# Patient Record
Sex: Male | Born: 1955 | Race: White | Hispanic: No | Marital: Married | Smoking: Never smoker
Health system: Southern US, Community
[De-identification: ages and names within clinical notes are randomized; demographics above are authoritative.]

## PROBLEM LIST (undated history)

## (undated) DIAGNOSIS — F419 Anxiety disorder, unspecified: Secondary | ICD-10-CM

## (undated) DIAGNOSIS — K859 Acute pancreatitis without necrosis or infection, unspecified: Secondary | ICD-10-CM

## (undated) DIAGNOSIS — J45909 Unspecified asthma, uncomplicated: Secondary | ICD-10-CM

## (undated) DIAGNOSIS — K8020 Calculus of gallbladder without cholecystitis without obstruction: Secondary | ICD-10-CM

## (undated) HISTORY — PX: GUM SURGERY: SHX658

## (undated) HISTORY — DX: Calculus of gallbladder without cholecystitis without obstruction: K80.20

---

## 1999-10-17 ENCOUNTER — Emergency Department (HOSPITAL_COMMUNITY): Admission: EM | Admit: 1999-10-17 | Discharge: 1999-10-17 | Payer: Self-pay | Admitting: Emergency Medicine

## 2000-11-01 ENCOUNTER — Encounter: Admission: RE | Admit: 2000-11-01 | Discharge: 2000-11-01 | Payer: Self-pay | Admitting: Internal Medicine

## 2000-11-02 ENCOUNTER — Emergency Department (HOSPITAL_COMMUNITY): Admission: EM | Admit: 2000-11-02 | Discharge: 2000-11-02 | Payer: Self-pay | Admitting: Internal Medicine

## 2004-04-11 ENCOUNTER — Emergency Department (HOSPITAL_COMMUNITY): Admission: EM | Admit: 2004-04-11 | Discharge: 2004-04-12 | Payer: Self-pay | Admitting: Emergency Medicine

## 2016-12-11 ENCOUNTER — Inpatient Hospital Stay (HOSPITAL_COMMUNITY): Payer: BC Managed Care – PPO

## 2016-12-11 ENCOUNTER — Inpatient Hospital Stay (HOSPITAL_COMMUNITY)
Admission: EM | Admit: 2016-12-11 | Discharge: 2016-12-23 | DRG: 417 | Disposition: A | Discharge status: Home / Self Care | Payer: BC Managed Care – PPO | Attending: Internal Medicine | Admitting: Internal Medicine

## 2016-12-11 ENCOUNTER — Emergency Department (HOSPITAL_COMMUNITY): Payer: BC Managed Care – PPO

## 2016-12-11 ENCOUNTER — Encounter (HOSPITAL_COMMUNITY): Payer: Self-pay | Admitting: Emergency Medicine

## 2016-12-11 DIAGNOSIS — R188 Other ascites: Secondary | ICD-10-CM | POA: Diagnosis not present

## 2016-12-11 DIAGNOSIS — Z7952 Long term (current) use of systemic steroids: Secondary | ICD-10-CM | POA: Diagnosis not present

## 2016-12-11 DIAGNOSIS — Y838 Other surgical procedures as the cause of abnormal reaction of the patient, or of later complication, without mention of misadventure at the time of the procedure: Secondary | ICD-10-CM | POA: Diagnosis not present

## 2016-12-11 DIAGNOSIS — J3801 Paralysis of vocal cords and larynx, unilateral: Secondary | ICD-10-CM | POA: Diagnosis not present

## 2016-12-11 DIAGNOSIS — R1313 Dysphagia, pharyngeal phase: Secondary | ICD-10-CM | POA: Diagnosis not present

## 2016-12-11 DIAGNOSIS — K859 Acute pancreatitis without necrosis or infection, unspecified: Secondary | ICD-10-CM

## 2016-12-11 DIAGNOSIS — J9811 Atelectasis: Secondary | ICD-10-CM | POA: Diagnosis not present

## 2016-12-11 DIAGNOSIS — M79606 Pain in leg, unspecified: Secondary | ICD-10-CM | POA: Diagnosis not present

## 2016-12-11 DIAGNOSIS — G8929 Other chronic pain: Secondary | ICD-10-CM | POA: Diagnosis present

## 2016-12-11 DIAGNOSIS — Z7951 Long term (current) use of inhaled steroids: Secondary | ICD-10-CM | POA: Diagnosis not present

## 2016-12-11 DIAGNOSIS — Y703 Surgical instruments, materials and anesthesiology devices (including sutures) associated with adverse incidents: Secondary | ICD-10-CM | POA: Diagnosis not present

## 2016-12-11 DIAGNOSIS — D72829 Elevated white blood cell count, unspecified: Secondary | ICD-10-CM | POA: Diagnosis not present

## 2016-12-11 DIAGNOSIS — R482 Apraxia: Secondary | ICD-10-CM | POA: Diagnosis not present

## 2016-12-11 DIAGNOSIS — Y92234 Operating room of hospital as the place of occurrence of the external cause: Secondary | ICD-10-CM | POA: Diagnosis not present

## 2016-12-11 DIAGNOSIS — Z79899 Other long term (current) drug therapy: Secondary | ICD-10-CM

## 2016-12-11 DIAGNOSIS — F419 Anxiety disorder, unspecified: Secondary | ICD-10-CM | POA: Diagnosis present

## 2016-12-11 DIAGNOSIS — E872 Acidosis: Secondary | ICD-10-CM | POA: Diagnosis present

## 2016-12-11 DIAGNOSIS — E876 Hypokalemia: Secondary | ICD-10-CM | POA: Diagnosis not present

## 2016-12-11 DIAGNOSIS — E861 Hypovolemia: Secondary | ICD-10-CM | POA: Diagnosis not present

## 2016-12-11 DIAGNOSIS — N179 Acute kidney failure, unspecified: Secondary | ICD-10-CM | POA: Diagnosis not present

## 2016-12-11 DIAGNOSIS — E871 Hypo-osmolality and hyponatremia: Secondary | ICD-10-CM | POA: Diagnosis not present

## 2016-12-11 DIAGNOSIS — D72825 Bandemia: Secondary | ICD-10-CM | POA: Diagnosis not present

## 2016-12-11 DIAGNOSIS — J90 Pleural effusion, not elsewhere classified: Secondary | ICD-10-CM | POA: Diagnosis not present

## 2016-12-11 DIAGNOSIS — G4700 Insomnia, unspecified: Secondary | ICD-10-CM | POA: Diagnosis present

## 2016-12-11 DIAGNOSIS — K851 Biliary acute pancreatitis without necrosis or infection: Secondary | ICD-10-CM

## 2016-12-11 DIAGNOSIS — B370 Candidal stomatitis: Secondary | ICD-10-CM | POA: Diagnosis not present

## 2016-12-11 DIAGNOSIS — E860 Dehydration: Secondary | ICD-10-CM | POA: Diagnosis present

## 2016-12-11 DIAGNOSIS — K8000 Calculus of gallbladder with acute cholecystitis without obstruction: Secondary | ICD-10-CM | POA: Diagnosis not present

## 2016-12-11 DIAGNOSIS — J45909 Unspecified asthma, uncomplicated: Secondary | ICD-10-CM | POA: Diagnosis present

## 2016-12-11 DIAGNOSIS — K8020 Calculus of gallbladder without cholecystitis without obstruction: Principal | ICD-10-CM | POA: Diagnosis present

## 2016-12-11 DIAGNOSIS — R609 Edema, unspecified: Secondary | ICD-10-CM | POA: Diagnosis not present

## 2016-12-11 DIAGNOSIS — D72823 Leukemoid reaction: Secondary | ICD-10-CM | POA: Diagnosis not present

## 2016-12-11 DIAGNOSIS — Z419 Encounter for procedure for purposes other than remedying health state, unspecified: Secondary | ICD-10-CM

## 2016-12-11 DIAGNOSIS — E8770 Fluid overload, unspecified: Secondary | ICD-10-CM | POA: Diagnosis not present

## 2016-12-11 DIAGNOSIS — I959 Hypotension, unspecified: Secondary | ICD-10-CM | POA: Diagnosis present

## 2016-12-11 HISTORY — DX: Anxiety disorder, unspecified: F41.9

## 2016-12-11 HISTORY — DX: Unspecified asthma, uncomplicated: J45.909

## 2016-12-11 LAB — COMPREHENSIVE METABOLIC PANEL
ALT: 24 U/L (ref 17–63)
AST: 28 U/L (ref 15–41)
Albumin: 3.7 g/dL (ref 3.5–5.0)
Alkaline Phosphatase: 29 U/L — ABNORMAL LOW (ref 38–126)
Anion gap: 10 (ref 5–15)
BUN: 35 mg/dL — ABNORMAL HIGH (ref 6–20)
CO2: 22 mmol/L (ref 22–32)
Calcium: 8.8 mg/dL — ABNORMAL LOW (ref 8.9–10.3)
Chloride: 100 mmol/L — ABNORMAL LOW (ref 101–111)
Creatinine, Ser: 1.39 mg/dL — ABNORMAL HIGH (ref 0.61–1.24)
GFR calc Af Amer: >60 mL/min (ref >60)
GFR calc non Af Amer: 53 mL/min — ABNORMAL LOW (ref >60)
Glucose, Bld: 133 mg/dL — ABNORMAL HIGH (ref 65–99)
Potassium: 4.3 mmol/L (ref 3.5–5.1)
Sodium: 132 mmol/L — ABNORMAL LOW (ref 135–145)
Total Bilirubin: 2.5 mg/dL — ABNORMAL HIGH (ref 0.3–1.2)
Total Protein: 6.4 g/dL — ABNORMAL LOW (ref 6.5–8.1)

## 2016-12-11 LAB — CREATININE, SERUM
CREATININE: 1.14 mg/dL (ref 0.61–1.24)
GFR calc Af Amer: >60 mL/min (ref >60)

## 2016-12-11 LAB — CBC
HCT: 42.5 % (ref 39.0–52.0)
Hemoglobin: 15 g/dL (ref 13.0–17.0)
MCH: 33.3 pg (ref 26.0–34.0)
MCHC: 35.3 g/dL (ref 30.0–36.0)
MCV: 94.2 fL (ref 78.0–100.0)
Platelets: 150 10*3/uL (ref 150–400)
RBC: 4.51 MIL/uL (ref 4.22–5.81)
RDW: 13 % (ref 11.5–15.5)
WBC: 15 10*3/uL — ABNORMAL HIGH (ref 4.0–10.5)

## 2016-12-11 LAB — CBC WITH DIFFERENTIAL/PLATELET
BASOS ABS: 0 10*3/uL (ref 0.0–0.1)
Basophils Relative: 0 %
EOS ABS: 0 10*3/uL (ref 0.0–0.7)
EOS PCT: 0 %
HCT: 48.6 % (ref 39.0–52.0)
HEMOGLOBIN: 17.5 g/dL — AB (ref 13.0–17.0)
Lymphocytes Relative: 2 %
Lymphs Abs: 0.4 10*3/uL — ABNORMAL LOW (ref 0.7–4.0)
MCH: 33.6 pg (ref 26.0–34.0)
MCHC: 36 g/dL (ref 30.0–36.0)
MCV: 93.3 fL (ref 78.0–100.0)
Monocytes Absolute: 1.2 10*3/uL — ABNORMAL HIGH (ref 0.1–1.0)
Monocytes Relative: 6 %
NEUTROS PCT: 92 %
Neutro Abs: 20.9 10*3/uL — ABNORMAL HIGH (ref 1.7–7.7)
PLATELETS: 187 10*3/uL (ref 150–400)
RBC: 5.21 MIL/uL (ref 4.22–5.81)
RDW: 13 % (ref 11.5–15.5)
WBC: 22.5 10*3/uL — AB (ref 4.0–10.5)

## 2016-12-11 LAB — URINALYSIS, ROUTINE W REFLEX MICROSCOPIC
Glucose, UA: NEGATIVE mg/dL
HGB URINE DIPSTICK: NEGATIVE
Ketones, ur: NEGATIVE mg/dL
Leukocytes, UA: NEGATIVE
Nitrite: NEGATIVE
PROTEIN: 100 mg/dL — AB
SPECIFIC GRAVITY, URINE: 1.033 — AB (ref 1.005–1.030)
pH: 5 (ref 5.0–8.0)

## 2016-12-11 LAB — TROPONIN I

## 2016-12-11 LAB — I-STAT CG4 LACTIC ACID, ED: LACTIC ACID, VENOUS: 3.39 mmol/L — AB (ref 0.5–1.9)

## 2016-12-11 LAB — LIPASE, BLOOD: Lipase: 376 U/L — ABNORMAL HIGH (ref 11–51)

## 2016-12-11 LAB — CK: CK TOTAL: 44 U/L — AB (ref 49–397)

## 2016-12-11 MED ORDER — ONDANSETRON HCL 4 MG/2ML IJ SOLN: 4.0000 mg | Freq: Four times a day (QID) | INTRAMUSCULAR | Status: DC | PRN

## 2016-12-11 MED ORDER — MORPHINE SULFATE 2 MG/ML IV SOLN
INTRAVENOUS | Status: DC
  Administered 2016-12-11: 19:00:00 via INTRAVENOUS
  Administered 2016-12-12: 1 mg via INTRAVENOUS
  Administered 2016-12-12: 12 mg via INTRAVENOUS
  Administered 2016-12-12: 6 mg via INTRAVENOUS
  Filled 2016-12-11: qty 30

## 2016-12-11 MED ORDER — FLEET ENEMA 7-19 GM/118ML RE ENEM: 1.0000 | ENEMA | Freq: Once | RECTAL | Status: DC | PRN

## 2016-12-11 MED ORDER — FENTANYL CITRATE (PF) 100 MCG/2ML IJ SOLN
50.0000 ug | Freq: Once | INTRAMUSCULAR | Status: AC
  Administered 2016-12-11: 50 ug via INTRAVENOUS
  Filled 2016-12-11: qty 2

## 2016-12-11 MED ORDER — NALOXONE HCL 0.4 MG/ML IJ SOLN: 0.4000 mg | INTRAMUSCULAR | Status: DC | PRN

## 2016-12-11 MED ORDER — ZOLPIDEM TARTRATE 5 MG PO TABS
5.0000 mg | ORAL_TABLET | Freq: Every evening | ORAL | Status: DC | PRN
  Administered 2016-12-16: 5 mg via ORAL
  Filled 2016-12-11 (×2): qty 1

## 2016-12-11 MED ORDER — IOPAMIDOL (ISOVUE-370) INJECTION 76%
INTRAVENOUS | Status: AC
  Filled 2016-12-11: qty 100

## 2016-12-11 MED ORDER — SODIUM CHLORIDE 0.9 % IV SOLN
INTRAVENOUS | Status: DC
  Administered 2016-12-11: 1000 mL via INTRAVENOUS

## 2016-12-11 MED ORDER — IOPAMIDOL (ISOVUE-300) INJECTION 61%
INTRAVENOUS | Status: AC
  Filled 2016-12-11: qty 30

## 2016-12-11 MED ORDER — MOMETASONE FURO-FORMOTEROL FUM 200-5 MCG/ACT IN AERO
2.0000 | INHALATION_SPRAY | Freq: Two times a day (BID) | RESPIRATORY_TRACT | Status: DC
  Administered 2016-12-11 – 2016-12-23 (×21): 2 via RESPIRATORY_TRACT
  Filled 2016-12-11: qty 8.8

## 2016-12-11 MED ORDER — DIPHENHYDRAMINE HCL 50 MG/ML IJ SOLN: 12.5000 mg | Freq: Four times a day (QID) | INTRAMUSCULAR | Status: DC | PRN

## 2016-12-11 MED ORDER — SODIUM CHLORIDE 0.9 % IV BOLUS (SEPSIS)
1000.0000 mL | Freq: Once | INTRAVENOUS | Status: AC
  Administered 2016-12-11: 1000 mL via INTRAVENOUS

## 2016-12-11 MED ORDER — ALBUTEROL SULFATE (2.5 MG/3ML) 0.083% IN NEBU: 2.5000 mg | INHALATION_SOLUTION | Freq: Four times a day (QID) | RESPIRATORY_TRACT | Status: DC | PRN

## 2016-12-11 MED ORDER — HEPARIN SODIUM (PORCINE) 5000 UNIT/ML IJ SOLN
5000.0000 [IU] | Freq: Three times a day (TID) | INTRAMUSCULAR | Status: DC
  Administered 2016-12-11 – 2016-12-12 (×2): 5000 [IU] via SUBCUTANEOUS
  Filled 2016-12-11 (×2): qty 1

## 2016-12-11 MED ORDER — SERTRALINE HCL 100 MG PO TABS
200.0000 mg | ORAL_TABLET | Freq: Every day | ORAL | Status: DC
  Administered 2016-12-11 – 2016-12-22 (×12): 200 mg via ORAL
  Filled 2016-12-11: qty 2
  Filled 2016-12-11: qty 4
  Filled 2016-12-11 (×4): qty 2
  Filled 2016-12-11: qty 4
  Filled 2016-12-11 (×5): qty 2

## 2016-12-11 MED ORDER — POLYETHYLENE GLYCOL 3350 17 G PO PACK: 17.0000 g | PACK | Freq: Every day | ORAL | Status: DC | PRN

## 2016-12-11 MED ORDER — IOPAMIDOL (ISOVUE-300) INJECTION 61%
30.0000 mL | Freq: Once | INTRAVENOUS | Status: AC | PRN
  Administered 2016-12-11: 30 mL via ORAL

## 2016-12-11 MED ORDER — CLONAZEPAM 0.5 MG PO TABS
1.5000 mg | ORAL_TABLET | Freq: Every day | ORAL | Status: DC
  Administered 2016-12-11 – 2016-12-14 (×4): 1.5 mg via ORAL
  Administered 2016-12-15: 0.5 mg via ORAL
  Administered 2016-12-15: 1 mg via ORAL
  Administered 2016-12-16 – 2016-12-22 (×7): 1.5 mg via ORAL
  Filled 2016-12-11: qty 3
  Filled 2016-12-11 (×3): qty 1
  Filled 2016-12-11: qty 3
  Filled 2016-12-11 (×7): qty 1

## 2016-12-11 MED ORDER — FENTANYL CITRATE (PF) 100 MCG/2ML IJ SOLN: 50.0000 ug | Freq: Once | INTRAMUSCULAR | Status: DC

## 2016-12-11 MED ORDER — SODIUM CHLORIDE 0.9% FLUSH: 9.0000 mL | INTRAVENOUS | Status: DC | PRN

## 2016-12-11 MED ORDER — DIPHENHYDRAMINE HCL 12.5 MG/5ML PO ELIX: 12.5000 mg | ORAL_SOLUTION | Freq: Four times a day (QID) | ORAL | Status: DC | PRN

## 2016-12-11 MED ORDER — SODIUM CHLORIDE 0.9 % IV SOLN: Freq: Once | INTRAVENOUS | Status: DC

## 2016-12-11 NOTE — ED Notes (Addendum)
CALLED LAB. CK troponin IS RUNNING NOW

## 2016-12-11 NOTE — ED Notes (Addendum)
Pt has attempted twice to use bathroom with urinal. Pt is unable to go at this time

## 2016-12-11 NOTE — ED Provider Notes (Addendum)
[facility]-EMERGENCY DEPT Provider Note   CSN: [id number] Arrival date & time: 12/11/16  1122     History   Chief Complaint Chief Complaint  Patient presents with  . Abdominal Pain    HPI [name] is a 61 y.o. male.  HPI  61 year old male presents with a chief complaint of abdominal pain. He's been having abdominal pain for the last 2 days. 3 or 4 days ago he did and even more aggressive workout than typical over 2 days. He states he typically does work out very intensely but this was even worse than normal. He has been having generalized soreness but mostly abdominal pain that is severe. No vomiting or diarrhea. No constipation. No fevers. No real chest pain but he does feel short of breath, especially with walking. His urine was very dark, brown in color today and went to his PCPs office was concern for rhabdomyoma lysis. Sent here for evaluation.  Past Medical History:  Diagnosis Date  . Anxiety     Patient Active Problem List   Diagnosis Date Noted  . Acute pancreatitis 12/11/2016  . Hyponatremia 12/11/2016  . AKI (acute kidney injury) (HCC) 12/11/2016  . Gallstone 12/11/2016    Past Surgical History:  Procedure Laterality Date  . GUM SURGERY         Home Medications    Prior to Admission medications   Medication Sig Start Date End Date Taking? Authorizing Provider  ADVAIR DISKUS 250-50 MCG/DOSE AEPB Inhale 1 puff into the lungs 2 (two) times daily. 11/06/16  Yes Historical Provider, MD  clonazePAM (KLONOPIN) 0.5 MG tablet Take 1.5 mg by mouth at bedtime. 11/08/16  Yes Historical Provider, MD  montelukast (SINGULAIR) 10 MG tablet Take 10 mg by mouth at bedtime. 11/23/16  Yes Historical Provider, MD  Multiple Vitamins-Minerals (MULTIVITAMIN ADULT PO) Take 1 tablet by mouth every evening.   Yes Historical Provider, MD  naproxen sodium (ANAPROX) 220 MG tablet Take 440 mg by mouth every 12 (twelve) hours as needed (pain).   Yes Historical Provider, MD  sertraline (ZOLOFT)  100 MG tablet Take 200 mg by mouth at bedtime. 11/08/16  Yes Historical Provider, MD    Family History Family History  Problem Relation Age of Onset  . Dementia Mother     Social History Social History  Substance Use Topics  . Smoking status: Never Smoker  . Smokeless tobacco: Never Used  . Alcohol use 0.6 oz/week    1 Glasses of wine per week     Allergies   Patient has no known allergies.   Review of Systems Review of Systems  Constitutional: Negative for fever.  Respiratory: Positive for shortness of breath.   Cardiovascular: Negative for chest pain.  Gastrointestinal: Positive for abdominal pain. Negative for vomiting.  Genitourinary: Positive for decreased urine volume. Negative for dysuria.  Musculoskeletal: Negative for myalgias.  All other systems reviewed and are negative.    Physical Exam Updated Vital Signs BP 110/87   Pulse 83   Temp 97.9 F (36.6 C) (Oral)   Resp 16   Ht 6\' 2"  (1.88 m)   Wt 192 lb (87.1 kg)   SpO2 92%   BMI 24.65 kg/m   Physical Exam  Constitutional: He is oriented to person, place, and time. He appears well-developed and well-nourished. No distress.  HENT:  Head: Normocephalic and atraumatic.  Right Ear: External ear normal.  Left Ear: External ear normal.  Nose: Nose normal.  Eyes: Right eye exhibits no discharge. Left eye  exhibits no discharge.  Neck: Neck supple.  Cardiovascular: Normal rate, regular rhythm and normal heart sounds.   Pulmonary/Chest: Effort normal and breath sounds normal. He has no wheezes. He has no rales. He exhibits no tenderness.  Abdominal: Soft. There is tenderness (diffuse). There is guarding.  Musculoskeletal: He exhibits no edema.  No joint swelling, extremity swelling or focal tenderness in extremities  Neurological: He is alert and oriented to person, place, and time.  Skin: Skin is warm and dry. He is not diaphoretic.  Nursing note and vitals reviewed.    ED Treatments / Results   Labs (all labs ordered are listed, but only abnormal results are displayed) Labs Reviewed  LIPASE, BLOOD - Abnormal; Notable for the following:       Result Value   Lipase 376 (*)    All other components within normal limits  COMPREHENSIVE METABOLIC PANEL - Abnormal; Notable for the following:    Sodium 132 (*)    Chloride 100 (*)    Glucose, Bld 133 (*)    BUN 35 (*)    Creatinine, Ser 1.39 (*)    Calcium 8.8 (*)    Total Protein 6.4 (*)    Alkaline Phosphatase 29 (*)    Total Bilirubin 2.5 (*)    GFR calc non Af Amer 53 (*)    All other components within normal limits  CBC WITH DIFFERENTIAL/PLATELET - Abnormal; Notable for the following:    WBC 22.5 (*)    Hemoglobin 17.5 (*)    Neutro Abs 20.9 (*)    Lymphs Abs 0.4 (*)    Monocytes Absolute 1.2 (*)    All other components within normal limits  CK - Abnormal; Notable for the following:    Total CK 44 (*)    All other components within normal limits  I-STAT CG4 LACTIC ACID, ED - Abnormal; Notable for the following:    Lactic Acid, Venous 3.39 (*)    All other components within normal limits  TROPONIN I  URINALYSIS, ROUTINE W REFLEX MICROSCOPIC    EKG  EKG Interpretation  Date/Time:  Tuesday December 11 2016 13:18:29 EST Ventricular Rate:  85 PR Interval:    QRS Duration: 98 QT Interval:  347 QTC Calculation: 413 R Axis:   105 Text Interpretation:  Sinus rhythm Right axis deviation nonspecific T waves no longer present compared to 2001 Confirmed by Tonae Livolsi MD, Margaret Staggs [phone]) on 12/11/2016 3:31:55 PM       Radiology Ct Abdomen Pelvis Wo Contrast  Result Date: 12/11/2016 CLINICAL DATA:  Abdominal pain since yesterday. EXAM: CT ABDOMEN AND PELVIS WITHOUT CONTRAST TECHNIQUE: Multidetector CT imaging of the abdomen and pelvis was performed following the standard protocol without IV contrast. COMPARISON:  None. FINDINGS: Lower chest: Small bilateral pleural effusions and bibasilar atelectasis. The heart is mildly enlarged.  Coronary artery calcifications are noted. The distal esophagus is grossly normal. Hepatobiliary: No focal hepatic lesions or intrahepatic biliary dilatation. There are gallstones and sludge noted in the gallbladder. No common bile duct dilatation. Pancreas: The pancreas is enlarged and diffusely inflamed. There is also extensive peripancreatic inflammation/phlegmon. Small fluid collections likely represent developing pseudocysts. No pancreatic mass. No pancreatic duct dilatation. No obvious obstructing common bile duct stone. There is a small duodenal diverticulum noted at near the pancreatic head. Spleen: Normal size. Simple appearing cysts noted. Peripancreatic fluid. Adrenals/Urinary Tract: The adrenal glands and kidneys are unremarkable. No renal, ureteral or bladder calculi or mass. Moderate fluid and inflammation noted in the anterior pararenal  spaces, left greater than right and also fluid tracking back along the pericolic gutters. Stomach/Bowel: The stomach, duodenum, small bowel and colon are unremarkable. No inflammatory changes, mass lesions or obstructive findings. The terminal ileum is normal. The appendix is normal. Vascular/Lymphatic: No significant aortic calcifications or aneurysm. Small scattered mesenteric and retroperitoneal lymph nodes but no mass or adenopathy. Reproductive: The prostate gland and seminal vesicles are unremarkable. Other: Small to moderate amount of free pelvic fluid. No inguinal mass or adenopathy. Musculoskeletal: No significant bony findings. IMPRESSION: CT findings consistent with acute pancreatitis as discussed above. Gallstones and gallbladder sludge are noted. No obvious common bile duct stone. Small bilateral pleural effusions and bibasilar atelectasis. Electronically Signed   By: Rudie MeyerP.  Gallerani M.D.   On: 12/11/2016 16:11   Dg Chest 2 View  Result Date: 12/11/2016 CLINICAL DATA:  Shortness of Breath EXAM: CHEST  2 VIEW COMPARISON:  None. FINDINGS: Low lung volumes  with bibasilar atelectasis or infiltrates. Mild cardiomegaly. No overt edema. No effusions or acute bony abnormality. IMPRESSION: Mild cardiomegaly.  Bibasilar atelectasis or infiltrates. Electronically Signed   By: Charlett NoseKevin  Dover M.D.   On: 12/11/2016 14:04    Procedures Procedures (including critical care time)  Medications Ordered in ED Medications  fentaNYL (SUBLIMAZE) injection 50 mcg (0 mcg Intravenous Hold 12/11/16 1334)  iopamidol (ISOVUE-300) 61 % injection (not administered)  0.9 %  sodium chloride infusion (not administered)  sodium chloride 0.9 % bolus 1,000 mL (not administered)  sodium chloride 0.9 % bolus 1,000 mL (0 mLs Intravenous Stopped 12/11/16 1527)  sodium chloride 0.9 % bolus 1,000 mL (0 mLs Intravenous Stopped 12/11/16 1527)  iopamidol (ISOVUE-300) 61 % injection 30 mL (30 mLs Oral Contrast Given 12/11/16 1413)  fentaNYL (SUBLIMAZE) injection 50 mcg (50 mcg Intravenous Given 12/11/16 1537)  sodium chloride 0.9 % bolus 1,000 mL (1,000 mLs Intravenous New Bag/Given 12/11/16 1537)     Initial Impression / Assessment and Plan / ED Course  I have reviewed the triage vital signs and the nursing notes.  Pertinent labs & imaging results that were available during my care of the patient were reviewed by me and considered in my medical decision making (see chart for details).  Clinical Course as of Dec 12 1706  Tue Dec 11, 2016  1312 Given patient's guarding and generalized abdominal soreness, will get CT. It could be that he has just overworked himself and has diffuse muscle strain that he is also hypotensive. We'll workup her abdomen lysis and start on IV fluids, give IV fentanyl for pain, and get labs, ECG, and chest x-ray. No extremity pain, swelling, or signs of compartment syndrome.  [SG]  1632 Dr Victorino StallFeliz-ortiz will admit. Asks for surgical consult. RUQ u/s ordered. He will eval and determine if antibiotics needed. Has WBC elevation but this could be from pancreatitis/stress response.  I think he is dehydrated and this explains lactate. No fevers.  [SG]  1707 Dr. Magnus IvanBlackman will consult. RUQ u/s pending  [SG]    Clinical Course User Index [SG] Pricilla LovelessScott Teale Goodgame, MD    Patient appears dehydrated. Given IV fluids, will need urine but he doesn't feel like he can give one yet. Likely mild AKI but no baseline. Given CT findings, will continue IV hydration for pancreatitis. Admit to hospitalist. VSS at this time. Drinks 2 glasses of wine per week, I don't think this is alcoholic pancreatitis.  Final Clinical Impressions(s) / ED Diagnoses   Final diagnoses:  Acute pancreatitis    New Prescriptions New Prescriptions  No medications on file     Pricilla Loveless, MD 12/11/16 1708    Pricilla Loveless, MD 12/11/16 [phone]

## 2016-12-11 NOTE — ED Notes (Signed)
Ultrasound at bedside

## 2016-12-11 NOTE — ED Notes (Signed)
DRINKING ORAL CONTRAST 

## 2016-12-11 NOTE — H&P (Signed)
History and Physical    [name] BJY:[id number] DOB: [birth year] DOA: 12/11/2016  PCP: Sissy Hoff, MD  Patient coming from: home   Chief Complaint: abdominal pain  HPI: [name] is a 61 y.o. male with no significant past medical history that comes in for abdominal pain that started 2 days prior to admission. He relates he was nauseated and started vomiting with significant abdominal pain no hematemesis. He took several naproxen to relieve the pain and was unsuccessful. He has not been able to tolerate anything by mouth since the abdominal pain started. He denies any diarrhea, fever, constipation. He relates he cannot take a deep breath as his abdominal pain gets worse. He relates that his urine has been very dark and brown colored today he was seen by his PCP today who sent him to the ED for possible rhabdomyolysis.  ED Course:  In the ED he was initially hypotensive. Given 3 L of normal saline his blood pressure has remained stable in the 110's. He is mildly hyponatremic and possibly acute renal failure, his CK was 44 lactic acid was 3.3, lipase was almost 400 with a white count of 22,000 with a left shift CT scan of the abdomen and elbows was done as below chest x-ray is clear  Review of Systems: As per HPI otherwise 10 point review of systems negative.    Past Medical History:  Diagnosis Date  . Anxiety     Past Surgical History:  Procedure Laterality Date  . GUM SURGERY       reports that he has never smoked. He has never used smokeless tobacco. He reports that he drinks about 0.6 oz of alcohol per week . He reports that he does not use drugs.  No Known Allergies  Family History  Problem Relation Age of Onset  . Dementia Mother     Prior to Admission medications   Medication Sig Start Date End Date Taking? Authorizing Provider  ADVAIR DISKUS 250-50 MCG/DOSE AEPB Inhale 1 puff into the lungs 2 (two) times daily. 11/06/16  Yes Historical Provider, MD  clonazePAM  (KLONOPIN) 0.5 MG tablet Take 1.5 mg by mouth at bedtime. 11/08/16  Yes Historical Provider, MD  montelukast (SINGULAIR) 10 MG tablet Take 10 mg by mouth at bedtime. 11/23/16  Yes Historical Provider, MD  Multiple Vitamins-Minerals (MULTIVITAMIN ADULT PO) Take 1 tablet by mouth every evening.   Yes Historical Provider, MD  naproxen sodium (ANAPROX) 220 MG tablet Take 440 mg by mouth every 12 (twelve) hours as needed (pain).   Yes Historical Provider, MD  sertraline (ZOLOFT) 100 MG tablet Take 200 mg by mouth at bedtime. 11/08/16  Yes Historical Provider, MD    Physical Exam: Vitals:   12/11/16 1500 12/11/16 1530 12/11/16 1630 12/11/16 1645  BP: 112/70 118/76 110/87 110/87  Pulse: 82 83 84 83  Resp: (!) 34 (!) 29 (!) 36 16  Temp:      TempSrc:      SpO2: 94% 95% 92% 92%  Weight:      Height:        Constitutional: NAD, calm, comfortable Vitals:   12/11/16 1500 12/11/16 1530 12/11/16 1630 12/11/16 1645  BP: 112/70 118/76 110/87 110/87  Pulse: 82 83 84 83  Resp: (!) 34 (!) 29 (!) 36 16  Temp:      TempSrc:      SpO2: 94% 95% 92% 92%  Weight:      Height:       Eyes: PERRL,  lids and conjunctivae normal ENMT: Mucous membranes are moist. Posterior pharynx clear of any exudate or lesions.Normal dentition.  Neck: normal, supple, no masses, no thyromegaly Respiratory: clear to auscultation bilaterally, no wheezing, no crackles. Normal respiratory effort. No accessory muscle use.  Cardiovascular: Regular rate and rhythm, no murmurs / rubs / gallops. No extremity edema. 2+ pedal pulses. No carotid bruits.  Abdomen: Decreased bowel sounds no masses significant the tender to palpation in all 4 quadrants but more specifically in the epigastric area Musculoskeletal: no clubbing / cyanosis. No joint deformity upper and lower extremities. Good ROM, no contractures. Normal muscle tone.  Skin: no rashes, lesions, ulcers. No induration Neurologic: CN 2-12 grossly intact. Sensation intact, DTR normal.  Strength 5/5 in all 4.  Psychiatric: Normal judgment and insight. Alert and oriented x 3. Normal mood.    Labs on Admission: I have personally reviewed following labs and imaging studies  CBC:  Recent Labs Lab 12/11/16 1326  WBC 22.5*  NEUTROABS 20.9*  HGB 17.5*  HCT 48.6  MCV 93.3  PLT 187   Basic Metabolic Panel:  Recent Labs Lab 12/11/16 1326  NA 132*  K 4.3  CL 100*  CO2 22  GLUCOSE 133*  BUN 35*  CREATININE 1.39*  CALCIUM 8.8*   GFR: Estimated Creatinine Clearance: 64.1 mL/min (by C-G formula based on SCr of 1.39 mg/dL (H)). Liver Function Tests:  Recent Labs Lab 12/11/16 1326  AST 28  ALT 24  ALKPHOS 29*  BILITOT 2.5*  PROT 6.4*  ALBUMIN 3.7    Recent Labs Lab 12/11/16 1326  LIPASE 376*   No results for input(s): AMMONIA in the last 168 hours. Coagulation Profile: No results for input(s): INR, PROTIME in the last 168 hours. Cardiac Enzymes:  Recent Labs Lab 12/11/16 1326  CKTOTAL 44*  TROPONINI <0.03   BNP (last 3 results) No results for input(s): PROBNP in the last 8760 hours. HbA1C: No results for input(s): HGBA1C in the last 72 hours. CBG: No results for input(s): GLUCAP in the last 168 hours. Lipid Profile: No results for input(s): CHOL, HDL, LDLCALC, TRIG, CHOLHDL, LDLDIRECT in the last 72 hours. Thyroid Function Tests: No results for input(s): TSH, T4TOTAL, FREET4, T3FREE, THYROIDAB in the last 72 hours. Anemia Panel: No results for input(s): VITAMINB12, FOLATE, FERRITIN, TIBC, IRON, RETICCTPCT in the last 72 hours. Urine analysis: No results found for: COLORURINE, APPEARANCEUR, LABSPEC, PHURINE, GLUCOSEU, HGBUR, BILIRUBINUR, KETONESUR, PROTEINUR, UROBILINOGEN, NITRITE, LEUKOCYTESUR  Radiological Exams on Admission: Ct Abdomen Pelvis Wo Contrast  Result Date: 12/11/2016 CLINICAL DATA:  Abdominal pain since yesterday. EXAM: CT ABDOMEN AND PELVIS WITHOUT CONTRAST TECHNIQUE: Multidetector CT imaging of the abdomen and pelvis was  performed following the standard protocol without IV contrast. COMPARISON:  None. FINDINGS: Lower chest: Small bilateral pleural effusions and bibasilar atelectasis. The heart is mildly enlarged. Coronary artery calcifications are noted. The distal esophagus is grossly normal. Hepatobiliary: No focal hepatic lesions or intrahepatic biliary dilatation. There are gallstones and sludge noted in the gallbladder. No common bile duct dilatation. Pancreas: The pancreas is enlarged and diffusely inflamed. There is also extensive peripancreatic inflammation/phlegmon. Small fluid collections likely represent developing pseudocysts. No pancreatic mass. No pancreatic duct dilatation. No obvious obstructing common bile duct stone. There is a small duodenal diverticulum noted at near the pancreatic head. Spleen: Normal size. Simple appearing cysts noted. Peripancreatic fluid. Adrenals/Urinary Tract: The adrenal glands and kidneys are unremarkable. No renal, ureteral or bladder calculi or mass. Moderate fluid and inflammation noted in the anterior pararenal  spaces, left greater than right and also fluid tracking back along the pericolic gutters. Stomach/Bowel: The stomach, duodenum, small bowel and colon are unremarkable. No inflammatory changes, mass lesions or obstructive findings. The terminal ileum is normal. The appendix is normal. Vascular/Lymphatic: No significant aortic calcifications or aneurysm. Small scattered mesenteric and retroperitoneal lymph nodes but no mass or adenopathy. Reproductive: The prostate gland and seminal vesicles are unremarkable. Other: Small to moderate amount of free pelvic fluid. No inguinal mass or adenopathy. Musculoskeletal: No significant bony findings. IMPRESSION: CT findings consistent with acute pancreatitis as discussed above. Gallstones and gallbladder sludge are noted. No obvious common bile duct stone. Small bilateral pleural effusions and bibasilar atelectasis. Electronically Signed    By: Rudie MeyerP.  Gallerani M.D.   On: 12/11/2016 16:11   Dg Chest 2 View  Result Date: 12/11/2016 CLINICAL DATA:  Shortness of Breath EXAM: CHEST  2 VIEW COMPARISON:  None. FINDINGS: Low lung volumes with bibasilar atelectasis or infiltrates. Mild cardiomegaly. No overt edema. No effusions or acute bony abnormality. IMPRESSION: Mild cardiomegaly.  Bibasilar atelectasis or infiltrates. Electronically Signed   By: Charlett NoseKevin  Dover M.D.   On: 12/11/2016 14:04    EKG: Independently reviewed. Normal sinus rhythm normal axis no T wave abnormalities.  Assessment/Plan Acute severe pancreatitis; He denies any excessive consumption of alcohol, he relates that before this episode as he only had one or 2 drinks of wine with his meal. This likely gallstone, I will get an abdominal ultrasound. We'll place him nothing by mouth, IV morphine in a cassette IV fluid hydration. We have already consulted general surgery. His lactic acidosis likely due to hypovolemia, he is not septic CT scan does not show necrosis, does show severe pancreatic inflammation/phlegmon. He denies any fevers, continue to monitor fever curve here in the hospital and will have a low threshold to start IV antibiotics.  Hyponatremia Likely due to hypovolemia. Continue IV fluids and recheck in the morning.  Leukocytosis: Likely stress emargination due to severe pancreatitis, we will continue monitor fever curve is get blood cultures 2 and will recheck in the morning. We'll continue to hold antibiotics.  AKI (acute kidney injury) (HCC) Unknown baseline, but he relates that he does not have renal dysfunction or has ever been told that his renal dysfunction. We'll continue aggressive IV fluids and recheck creatinine in the morning.  Gallstone: Management per surgery.   DVT prophylaxis: heparin Code Status: full Family Communication: wife Disposition Plan:3.11.2018 Consults called: Surgery Admission status: inpatient   Marinda ElkFELIZ ORTIZ, Cheynne Virden  MD [facility] Pager [phone]  If 7PM-7AM, please contact night-coverage [email or web address] Password [email or web address]  12/11/2016, 5:09 PM

## 2016-12-11 NOTE — ED Notes (Signed)
Abnormal lab result MD Criss AlvineGoldston and RN Morrie Sheldonshley have been made aware

## 2016-12-11 NOTE — ED Triage Notes (Signed)
Patient was sent over by MD, [facility].  Patient is complaining of abdominal pain all over.  Patient states his urine is dark in color.  Patient took 7 Naproxen yesterday.  MD is questioning Rhabdomyolysis.

## 2016-12-11 NOTE — ED Notes (Signed)
Patient transported to CT 

## 2016-12-11 NOTE — ED Notes (Signed)
Bed: WA03 Expected date:  Expected time:  Means of arrival:  Comments: Hold for triage 2 

## 2016-12-11 NOTE — Consult Note (Signed)
Reason for Consult:gallstone pancreatitis Referring Physician: Dr. Hervey Ard is an 61 y.o. male.  HPI: This gentleman presents with several-day history of abdominal pain, nausea, and vomiting. It was initially felt that he might have rhabdomyolysis as he was having dark urine and worked out for greater than 3 hours 2 straight days this past weekend. He's also had nausea and vomiting. He was found have an elevated lipase and findings consistent with pancreatitis on a CT scan. 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 He has been admitted to the medical service. Surgeries consult for gallstone pancreatitis. The patient reports moderate epigastric pain hurting due to the back. He has no previous history of symptomatic cholelithiasis.  Past Medical History:  Diagnosis Date  . Anxiety     Past Surgical History:  Procedure Laterality Date  . GUM SURGERY      Family History  Problem Relation Age of Onset  . Dementia Mother     Social History:  reports that he has never smoked. He has never used smokeless tobacco. He reports that he drinks about 0.6 oz of alcohol per week . He reports that he does not use drugs.  Allergies: No Known Allergies  Medications: I have reviewed the patient's current medications.  Results for orders placed or performed during the hospital encounter of 12/11/16 (from the past 48 hour(s))  Urinalysis, Routine w reflex microscopic     Status: Abnormal   Collection Time: 12/11/16 12:08 PM  Result Value Ref Range   Color, Urine AMBER (A) YELLOW    Comment: BIOCHEMICALS MAY BE AFFECTED BY COLOR   APPearance HAZY (A) CLEAR   Specific Gravity, Urine 1.033 (H) 1.005 - 1.030   pH 5.0 5.0 - 8.0   Glucose, UA NEGATIVE NEGATIVE mg/dL   Hgb urine dipstick NEGATIVE NEGATIVE   Bilirubin Urine SMALL (A) NEGATIVE   Ketones, ur NEGATIVE NEGATIVE mg/dL   Protein, ur 100 (A) NEGATIVE mg/dL   Nitrite NEGATIVE NEGATIVE   Leukocytes, UA NEGATIVE NEGATIVE   RBC / HPF  0-5 0 - 5 RBC/hpf   WBC, UA 0-5 0 - 5 WBC/hpf   Bacteria, UA FEW (A) NONE SEEN   Squamous Epithelial / LPF 0-5 (A) NONE SEEN   Mucous PRESENT    Hyaline Casts, UA PRESENT   Lipase, blood     Status: Abnormal   Collection Time: 12/11/16  1:26 PM  Result Value Ref Range   Lipase 376 (H) 11 - 51 U/L  Comprehensive metabolic panel     Status: Abnormal   Collection Time: 12/11/16  1:26 PM  Result Value Ref Range   Sodium 132 (L) 135 - 145 mmol/L   Potassium 4.3 3.5 - 5.1 mmol/L   Chloride 100 (L) 101 - 111 mmol/L   CO2 22 22 - 32 mmol/L   Glucose, Bld 133 (H) 65 - 99 mg/dL   BUN 35 (H) 6 - 20 mg/dL   Creatinine, Ser 1.39 (H) 0.61 - 1.24 mg/dL   Calcium 8.8 (L) 8.9 - 10.3 mg/dL   Total Protein 6.4 (L) 6.5 - 8.1 g/dL   Albumin 3.7 3.5 - 5.0 g/dL   AST 28 15 - 41 U/L   ALT 24 17 - 63 U/L   Alkaline Phosphatase 29 (L) 38 - 126 U/L   Total Bilirubin 2.5 (H) 0.3 - 1.2 mg/dL   GFR calc non Af Amer 53 (L) >60 mL/min   GFR calc Af Amer >60 >60 mL/min  Comment: (NOTE) The eGFR has been calculated using the CKD EPI equation. This calculation has not been validated in all clinical situations. eGFR's persistently <60 mL/min signify possible Chronic Kidney Disease.    Anion gap 10 5 - 15  CBC with Differential     Status: Abnormal   Collection Time: 12/11/16  1:26 PM  Result Value Ref Range   WBC 22.5 (H) 4.0 - 10.5 K/uL   RBC 5.21 4.22 - 5.81 MIL/uL   Hemoglobin 17.5 (H) 13.0 - 17.0 g/dL   HCT 48.6 39.0 - 52.0 %   MCV 93.3 78.0 - 100.0 fL   MCH 33.6 26.0 - 34.0 pg   MCHC 36.0 30.0 - 36.0 g/dL   RDW 13.0 11.5 - 15.5 %   Platelets 187 150 - 400 K/uL   Neutrophils Relative % 92 %   Neutro Abs 20.9 (H) 1.7 - 7.7 K/uL   Lymphocytes Relative 2 %   Lymphs Abs 0.4 (L) 0.7 - 4.0 K/uL   Monocytes Relative 6 %   Monocytes Absolute 1.2 (H) 0.1 - 1.0 K/uL   Eosinophils Relative 0 %   Eosinophils Absolute 0.0 0.0 - 0.7 K/uL   Basophils Relative 0 %   Basophils Absolute 0.0 0.0 - 0.1 K/uL   CK     Status: Abnormal   Collection Time: 12/11/16  1:26 PM  Result Value Ref Range   Total CK 44 (L) 49 - 397 U/L  Troponin I     Status: None   Collection Time: 12/11/16  1:26 PM  Result Value Ref Range   Troponin I <0.03 <0.03 ng/mL  I-Stat CG4 Lactic Acid, ED     Status: Abnormal   Collection Time: 12/11/16  1:30 PM  Result Value Ref Range   Lactic Acid, Venous 3.39 (HH) 0.5 - 1.9 mmol/L   Comment NOTIFIED PHYSICIAN     Ct Abdomen Pelvis Wo Contrast  Result Date: 12/11/2016 CLINICAL DATA:  Abdominal pain since yesterday. EXAM: CT ABDOMEN AND PELVIS WITHOUT CONTRAST TECHNIQUE: Multidetector CT imaging of the abdomen and pelvis was performed following the standard protocol without IV contrast. COMPARISON:  None. FINDINGS: Lower chest: Small bilateral pleural effusions and bibasilar atelectasis. The heart is mildly enlarged. Coronary artery calcifications are noted. The distal esophagus is grossly normal. Hepatobiliary: No focal hepatic lesions or intrahepatic biliary dilatation. There are gallstones and sludge noted in the gallbladder. No common bile duct dilatation. Pancreas: The pancreas is enlarged and diffusely inflamed. There is also extensive peripancreatic inflammation/phlegmon. Small fluid collections likely represent developing pseudocysts. No pancreatic mass. No pancreatic duct dilatation. No obvious obstructing common bile duct stone. There is a small duodenal diverticulum noted at near the pancreatic head. Spleen: Normal size. Simple appearing cysts noted. Peripancreatic fluid. Adrenals/Urinary Tract: The adrenal glands and kidneys are unremarkable. No renal, ureteral or bladder calculi or mass. Moderate fluid and inflammation noted in the anterior pararenal spaces, left greater than right and also fluid tracking back along the pericolic gutters. Stomach/Bowel: The stomach, duodenum, small bowel and colon are unremarkable. No inflammatory changes, mass lesions or obstructive  findings. The terminal ileum is normal. The appendix is normal. Vascular/Lymphatic: No significant aortic calcifications or aneurysm. Small scattered mesenteric and retroperitoneal lymph nodes but no mass or adenopathy. Reproductive: The prostate gland and seminal vesicles are unremarkable. Other: Small to moderate amount of free pelvic fluid. No inguinal mass or adenopathy. Musculoskeletal: No significant bony findings. IMPRESSION: CT findings consistent with acute pancreatitis as discussed above. Gallstones and gallbladder sludge  are noted. No obvious common bile duct stone. Small bilateral pleural effusions and bibasilar atelectasis. Electronically Signed   By: Marijo Sanes M.D.   On: 12/11/2016 16:11   Dg Chest 2 View  Result Date: 12/11/2016 CLINICAL DATA:  Shortness of Breath EXAM: CHEST  2 VIEW COMPARISON:  None. FINDINGS: Low lung volumes with bibasilar atelectasis or infiltrates. Mild cardiomegaly. No overt edema. No effusions or acute bony abnormality. IMPRESSION: Mild cardiomegaly.  Bibasilar atelectasis or infiltrates. Electronically Signed   By: Rolm Baptise M.D.   On: 12/11/2016 14:04   US Abdomen Limited Ruq  Result Date: 12/11/2016 CLINICAL DATA:  Acute pancreatitis EXAM: US ABDOMEN LIMITED - RIGHT UPPER QUADRANT COMPARISON:  CT abdomen pelvis 12/11/2016 FINDINGS: Gallbladder: Multiple gallstones measuring up to 8 mm in diameter. Gallbladder wall not significantly thickened. Positive sonographic Murphy sign. Common bile duct: Diameter: 4.5 mm Liver: No focal lesion identified. Within normal limits in parenchymal echogenicity. Small amount of fluid around the liver IMPRESSION: Cholelithiasis. Patient tender over the gallbladder compatible with positive sonographic Murphy sign. No biliary dilatation. Small amount of free fluid around the liver. Electronically Signed   By: Franchot Gallo M.D.   On: 12/11/2016 17:29    Review of Systems  Constitutional: Negative for chills and fever.  Eyes:  Negative for double vision.  Respiratory: Negative for cough and shortness of breath.   Cardiovascular: Negative for chest pain.  Gastrointestinal: Positive for abdominal pain, nausea and vomiting. Negative for diarrhea.  Genitourinary: Negative for dysuria.  Musculoskeletal: Negative for myalgias.  All other systems reviewed and are negative.  Blood pressure 127/69, pulse 87, temperature 98.2 F (36.8 C), temperature source Oral, resp. rate (!) 33, height 6' 2"  (1.88 m), weight 93.4 kg (205 lb 14.6 oz), SpO2 91 %. Physical Exam  Constitutional: He is oriented to person, place, and time. He appears well-developed and well-nourished. He appears distressed.  Appears anxious and dry  HENT:  Head: Normocephalic and atraumatic.  Right Ear: External ear normal.  Left Ear: External ear normal.  Nose: Nose normal.  Mouth/Throat: No oropharyngeal exudate.  Eyes: Conjunctivae are normal. Pupils are equal, round, and reactive to light. Right eye exhibits no discharge. Left eye exhibits no discharge. No scleral icterus.  Neck: Normal range of motion. No tracheal deviation present.  Cardiovascular: Normal rate, regular rhythm, normal heart sounds and intact distal pulses.   Respiratory: Breath sounds normal. No respiratory distress. He has no wheezes.  GI: He exhibits distension. There is tenderness. There is guarding.  He is distended with tenderness and guarding in the epigastrium  Musculoskeletal: Normal range of motion. He exhibits no edema or tenderness.  Neurological: He is alert and oriented to person, place, and time.  Skin: Skin is warm. No rash noted. He is not diaphoretic. No erythema.  Psychiatric: His behavior is normal. Judgment normal.    Assessment/Plan: Gentleman with gallstone pancreatitis  His lipase and bilirubin are elevated. There are, however, no dilated ducts on his ultrasound or CT scan. Hopefully the stone has passed. If, however, his liver function tests increase, he  may need a GI consult to consider an ERCP.  For now, he will undergo bowel rest and IV rehydration. I explained the diagnosis to the patient and his family. Laparoscopic cholecystectomy with cholangiogram is recommended this admission once his pancreatitis has improved. They are in agreement with the plan.  Kenta Laster A 12/11/2016, 7:17 PM

## 2016-12-11 NOTE — ED Notes (Signed)
ADMITTING Provider at bedside. 

## 2016-12-12 DIAGNOSIS — K851 Biliary acute pancreatitis without necrosis or infection: Secondary | ICD-10-CM

## 2016-12-12 LAB — COMPREHENSIVE METABOLIC PANEL
ALBUMIN: 2.7 g/dL — AB (ref 3.5–5.0)
ALK PHOS: 26 U/L — AB (ref 38–126)
ALT: 18 U/L (ref 17–63)
ALT: 18 U/L (ref 17–63)
AST: 20 U/L (ref 15–41)
AST: 20 U/L (ref 15–41)
Albumin: 3 g/dL — ABNORMAL LOW (ref 3.5–5.0)
Alkaline Phosphatase: 31 U/L — ABNORMAL LOW (ref 38–126)
Anion gap: 5 (ref 5–15)
Anion gap: 7 (ref 5–15)
BILIRUBIN TOTAL: 2.1 mg/dL — AB (ref 0.3–1.2)
BILIRUBIN TOTAL: 2 mg/dL — AB (ref 0.3–1.2)
BUN: 30 mg/dL — ABNORMAL HIGH (ref 6–20)
BUN: 29 mg/dL — AB (ref 6–20)
CALCIUM: 8.4 mg/dL — AB (ref 8.9–10.3)
CALCIUM: 8.4 mg/dL — AB (ref 8.9–10.3)
CO2: 24 mmol/L (ref 22–32)
CO2: 23 mmol/L (ref 22–32)
Chloride: 109 mmol/L (ref 101–111)
Chloride: 105 mmol/L (ref 101–111)
Creatinine, Ser: 0.87 mg/dL (ref 0.61–1.24)
Creatinine, Ser: 0.91 mg/dL (ref 0.61–1.24)
GFR calc Af Amer: >60 mL/min (ref >60)
GFR calc Af Amer: >60 mL/min (ref >60)
GFR calc non Af Amer: >60 mL/min (ref >60)
GLUCOSE: 104 mg/dL — AB (ref 65–99)
Glucose, Bld: 132 mg/dL — ABNORMAL HIGH (ref 65–99)
POTASSIUM: 4.9 mmol/L (ref 3.5–5.1)
POTASSIUM: 4.3 mmol/L (ref 3.5–5.1)
Sodium: 138 mmol/L (ref 135–145)
Sodium: 135 mmol/L (ref 135–145)
TOTAL PROTEIN: 5.7 g/dL — AB (ref 6.5–8.1)
TOTAL PROTEIN: 6.2 g/dL — AB (ref 6.5–8.1)

## 2016-12-12 LAB — CBC
HEMATOCRIT: 42.7 % (ref 39.0–52.0)
HEMOGLOBIN: 14.8 g/dL (ref 13.0–17.0)
MCH: 32.4 pg (ref 26.0–34.0)
MCHC: 34.7 g/dL (ref 30.0–36.0)
MCV: 93.4 fL (ref 78.0–100.0)
Platelets: 156 10*3/uL (ref 150–400)
RBC: 4.57 MIL/uL (ref 4.22–5.81)
RDW: 13.1 % (ref 11.5–15.5)
WBC: 13.9 10*3/uL — AB (ref 4.0–10.5)

## 2016-12-12 LAB — LIPASE, BLOOD: Lipase: 93 U/L — ABNORMAL HIGH (ref 11–51)

## 2016-12-12 MED ORDER — MONTELUKAST SODIUM 10 MG PO TABS
10.0000 mg | ORAL_TABLET | Freq: Every day | ORAL | Status: DC
  Administered 2016-12-12 – 2016-12-22 (×11): 10 mg via ORAL
  Filled 2016-12-12 (×11): qty 1

## 2016-12-12 MED ORDER — MORPHINE SULFATE (PF) 4 MG/ML IV SOLN
1.0000 mg | INTRAVENOUS | Status: DC | PRN
  Administered 2016-12-12 – 2016-12-14 (×4): 2 mg via INTRAVENOUS
  Filled 2016-12-12 (×4): qty 1

## 2016-12-12 MED ORDER — OXYCODONE HCL 5 MG PO TABS
5.0000 mg | ORAL_TABLET | ORAL | Status: DC | PRN
  Administered 2016-12-13 – 2016-12-14 (×4): 10 mg via ORAL
  Administered 2016-12-15: 5 mg via ORAL
  Filled 2016-12-12: qty 1
  Filled 2016-12-12 (×4): qty 2
  Filled 2016-12-12: qty 1

## 2016-12-12 MED ORDER — MORPHINE SULFATE (PF) 4 MG/ML IV SOLN
1.0000 mg | Freq: Four times a day (QID) | INTRAVENOUS | Status: DC | PRN
  Administered 2016-12-12: 1 mg via INTRAVENOUS
  Filled 2016-12-12: qty 1

## 2016-12-12 MED ORDER — ACETAMINOPHEN 325 MG PO TABS
650.0000 mg | ORAL_TABLET | ORAL | Status: DC | PRN
  Administered 2016-12-12 – 2016-12-21 (×18): 650 mg via ORAL
  Filled 2016-12-12 (×19): qty 2

## 2016-12-12 MED ORDER — SODIUM CHLORIDE 0.9 % IV SOLN
INTRAVENOUS | Status: DC
  Administered 2016-12-12 – 2016-12-14 (×4): via INTRAVENOUS
  Administered 2016-12-15 – 2016-12-16 (×2): 1000 mL via INTRAVENOUS

## 2016-12-12 MED ORDER — ENOXAPARIN SODIUM 40 MG/0.4ML ~~LOC~~ SOLN
40.0000 mg | SUBCUTANEOUS | Status: DC
  Administered 2016-12-14 – 2016-12-22 (×9): 40 mg via SUBCUTANEOUS
  Filled 2016-12-12 (×10): qty 0.4

## 2016-12-12 NOTE — Discharge Summary (Deleted)
PROGRESS NOTE    [name]   [name]  DOB: 02/20/1956  DOA: 12/11/2016 PCP: Sissy Hoff, MD   Brief Narrative:  [name] is a 61 y.o. male with no significant past medical history that comes in for abdominal pain that started 2 days prior to admission. He relates he was nauseated and started vomiting with significant abdominal pain no hematemesis. He took several naproxen to relieve the pain and was unsuccessful. He has not been able to tolerate anything by mouth since the abdominal pain started. He denies any diarrhea, fever, constipation. He relates he cannot take a deep breath as his abdominal pain gets worse. He relates that his urine has been very dark and brown colored today he was seen by his PCP today who sent him to the ED. Found to have acute pancreatitis with phlegmon and gallstones on CT.   Subjective: Having pain across upper abdomen, none in back . No nausea or vomiting. No stool. Urinating well.   Assessment & Plan:   Principal Problem:   Acute gallstone pancreatitis - symptomatically improved- cut back on narcotics  - start clear liquids-   - allow to ambulate with assistance - gen surgery following   Active Problems:   Hyponatremia   AKI (acute kidney injury)  - prerenal - improved with IVF  Leukocytosis - improving  Mild asthma - resume steroid inhaler and Singulair   DVT prophylaxis: Heparin- switch to Lovenox Code Status: Full code Family Communication: wife Disposition Plan: follow on med/surg Consultants:   gen surgery Procedures:    Antimicrobials:  Anti-infectives    None       Objective: Vitals:   12/12/16 0400 12/12/16 0542 12/12/16 0755 12/12/16 0911  BP:  106/68    Pulse:  86    Resp: (!) 31 18 20    Temp:  98.3 F (36.8 C)    TempSrc:  Oral    SpO2: 95% 94% 93% 96%  Weight:  93.5 kg (206 lb 2.1 oz)    Height:        Intake/Output Summary (Last 24 hours) at 12/12/16 1300 Last data filed at 12/12/16 1053  Gross per 24 hour  Intake             4840 ml  Output              500 ml  Net             4340 ml   Filed Weights   12/11/16 1204 12/11/16 1833 12/12/16 0542  Weight: 87.1 kg (192 lb) 93.4 kg (205 lb 14.6 oz) 93.5 kg (206 lb 2.1 oz)    Examination: General exam: Appears comfortable  HEENT: PERRLA, oral mucosa moist, no sclera icterus or thrush Respiratory system: Clear to auscultation. Respiratory effort normal. Cardiovascular system: S1 & S2 heard, RRR.  No murmurs  Gastrointestinal system: Abdomen soft,  Tender across upper abdomen, nondistended. Normal bowel sound. No organomegaly Central nervous system: Alert and oriented. No focal neurological deficits. Extremities: No cyanosis, clubbing or edema Skin: No rashes or ulcers Psychiatry:  Mood & affect appropriate.     Data Reviewed: I have personally reviewed following labs and imaging studies  CBC:  Recent Labs Lab 12/11/16 1326 12/11/16 1942 12/12/16 0428  WBC 22.5* 15.0* 13.9*  NEUTROABS 20.9*  --   --   HGB 17.5* 15.0 14.8  HCT 48.6 42.5 42.7  MCV 93.3 94.2 93.4  PLT 187 150 156   Basic Metabolic Panel:  Recent Labs Lab 12/11/16 1326 12/11/16 1942 12/12/16 0428 12/12/16 1122  NA 132*  --  138 135  K 4.3  --  4.9 4.3  CL 100*  --  109 105  CO2 22  --  24 23  GLUCOSE 133*  --  104* 132*  BUN 35*  --  30* 29*  CREATININE 1.39* 1.14 0.87 0.91  CALCIUM 8.8*  --  8.4* 8.4*   GFR: Estimated Creatinine Clearance: 97.9 mL/min (by C-G formula based on SCr of 0.91 mg/dL). Liver Function Tests:  Recent Labs Lab 12/11/16 1326 12/12/16 0428 12/12/16 1122  AST 28 20 20   ALT 24 18 18   ALKPHOS 29* 26* 31*  BILITOT 2.5* 2.1* 2.0*  PROT 6.4* 5.7* 6.2*  ALBUMIN 3.7 2.7* 3.0*    Recent Labs Lab 12/11/16 1326 12/12/16 0428  LIPASE 376* 93*   No results for input(s): AMMONIA in the last 168 hours. Coagulation Profile: No results for input(s): INR, PROTIME in the last 168 hours. Cardiac  Enzymes:  Recent Labs Lab 12/11/16 1326  CKTOTAL 44*  TROPONINI <0.03   BNP (last 3 results) No results for input(s): PROBNP in the last 8760 hours. HbA1C: No results for input(s): HGBA1C in the last 72 hours. CBG: No results for input(s): GLUCAP in the last 168 hours. Lipid Profile: No results for input(s): CHOL, HDL, LDLCALC, TRIG, CHOLHDL, LDLDIRECT in the last 72 hours. Thyroid Function Tests: No results for input(s): TSH, T4TOTAL, FREET4, T3FREE, THYROIDAB in the last 72 hours. Anemia Panel: No results for input(s): VITAMINB12, FOLATE, FERRITIN, TIBC, IRON, RETICCTPCT in the last 72 hours. Urine analysis:    Component Value Date/Time   COLORURINE AMBER (A) 12/11/2016 1208   APPEARANCEUR HAZY (A) 12/11/2016 1208   LABSPEC 1.033 (H) 12/11/2016 1208   PHURINE 5.0 12/11/2016 1208   GLUCOSEU NEGATIVE 12/11/2016 1208   HGBUR NEGATIVE 12/11/2016 1208   BILIRUBINUR SMALL (A) 12/11/2016 1208   KETONESUR NEGATIVE 12/11/2016 1208   PROTEINUR 100 (A) 12/11/2016 1208   NITRITE NEGATIVE 12/11/2016 1208   LEUKOCYTESUR NEGATIVE 12/11/2016 1208   Sepsis Labs: @LABRCNTIP (procalcitonin:4,lacticidven:4) )No results found for this or any previous visit (from the past 240 hour(s)).       Radiology Studies: Ct Abdomen Pelvis Wo Contrast  Result Date: 12/11/2016 CLINICAL DATA:  Abdominal pain since yesterday. EXAM: CT ABDOMEN AND PELVIS WITHOUT CONTRAST TECHNIQUE: Multidetector CT imaging of the abdomen and pelvis was performed following the standard protocol without IV contrast. COMPARISON:  None. FINDINGS: Lower chest: Small bilateral pleural effusions and bibasilar atelectasis. The heart is mildly enlarged. Coronary artery calcifications are noted. The distal esophagus is grossly normal. Hepatobiliary: No focal hepatic lesions or intrahepatic biliary dilatation. There are gallstones and sludge noted in the gallbladder. No common bile duct dilatation. Pancreas: The pancreas is enlarged  and diffusely inflamed. There is also extensive peripancreatic inflammation/phlegmon. Small fluid collections likely represent developing pseudocysts. No pancreatic mass. No pancreatic duct dilatation. No obvious obstructing common bile duct stone. There is a small duodenal diverticulum noted at near the pancreatic head. Spleen: Normal size. Simple appearing cysts noted. Peripancreatic fluid. Adrenals/Urinary Tract: The adrenal glands and kidneys are unremarkable. No renal, ureteral or bladder calculi or mass. Moderate fluid and inflammation noted in the anterior pararenal spaces, left greater than right and also fluid tracking back along the pericolic gutters. Stomach/Bowel: The stomach, duodenum, small bowel and colon are unremarkable. No inflammatory changes, mass lesions or obstructive findings. The terminal ileum is normal. The appendix is normal. Vascular/Lymphatic:  No significant aortic calcifications or aneurysm. Small scattered mesenteric and retroperitoneal lymph nodes but no mass or adenopathy. Reproductive: The prostate gland and seminal vesicles are unremarkable. Other: Small to moderate amount of free pelvic fluid. No inguinal mass or adenopathy. Musculoskeletal: No significant bony findings. IMPRESSION: CT findings consistent with acute pancreatitis as discussed above. Gallstones and gallbladder sludge are noted. No obvious common bile duct stone. Small bilateral pleural effusions and bibasilar atelectasis. Electronically Signed   By: Rudie MeyerP.  Gallerani M.D.   On: 12/11/2016 16:11   Dg Chest 2 View  Result Date: 12/11/2016 CLINICAL DATA:  Shortness of Breath EXAM: CHEST  2 VIEW COMPARISON:  None. FINDINGS: Low lung volumes with bibasilar atelectasis or infiltrates. Mild cardiomegaly. No overt edema. No effusions or acute bony abnormality. IMPRESSION: Mild cardiomegaly.  Bibasilar atelectasis or infiltrates. Electronically Signed   By: Charlett NoseKevin  Dover M.D.   On: 12/11/2016 14:04   [place] Abdomen Limited  Ruq  Result Date: 12/11/2016 CLINICAL DATA:  Acute pancreatitis EXAM: US ABDOMEN LIMITED - RIGHT UPPER QUADRANT COMPARISON:  CT abdomen pelvis 12/11/2016 FINDINGS: Gallbladder: Multiple gallstones measuring up to 8 mm in diameter. Gallbladder wall not significantly thickened. Positive sonographic Murphy sign. Common bile duct: Diameter: 4.5 mm Liver: No focal lesion identified. Within normal limits in parenchymal echogenicity. Small amount of fluid around the liver IMPRESSION: Cholelithiasis. Patient tender over the gallbladder compatible with positive sonographic Murphy sign. No biliary dilatation. Small amount of free fluid around the liver. Electronically Signed   By: Marlan Palauharles  Clark M.D.   On: 12/11/2016 17:29      Scheduled Meds: . clonazePAM  1.5 mg Oral QHS  . heparin  5,000 Units Subcutaneous Q8H  . mometasone-formoterol  2 puff Inhalation BID  . montelukast  10 mg Oral QHS  . sertraline  200 mg Oral QHS   Continuous Infusions: . sodium chloride 1,000 mL (12/11/16 2232)  . sodium chloride 125 mL/hr at 12/12/16 1014     LOS: 1 day    Time spent in minutes: 35    Rachael Ferrie, MD [facility] Pager: [email or web address] Password [email or web address] 12/12/2016, 1:00 PM

## 2016-12-12 NOTE — Progress Notes (Signed)
Wasted 18cc of Morphine PCA in sink with Jilda PandaEkua Acquaah, RN.

## 2016-12-12 NOTE — Progress Notes (Signed)
Subjective: Still having pain mid lower abdomen and his back.  It is better, but still present.    Objective: Vital signs in last 24 hours: Temp:  [97.9 F (36.6 C)-100 F (37.8 C)] 98.3 F (36.8 C) (03/07 0542) Pulse Rate:  [82-106] 86 (03/07 0542) Resp:  [16-36] 20 (03/07 0755) BP: (84-127)/(65-87) 106/68 (03/07 0542) SpO2:  [91 %-98 %] 96 % (03/07 0911) Weight:  [83.5 kg (184 lb)-93.5 kg (206 lb 2.1 oz)] 93.5 kg (206 lb 2.1 oz) (03/07 0542) Last BM Date: 12/09/16 400 Po 3000 IV 500 urine recorded  TM 100, VSS  Lipase down to 93 Bilirubin still up 2.1 WBC trending down 13.9    Intake/Output from previous day: 03/06 0701 - 03/07 0700 In: 4600 [P.O.:400; I.V.:1200; IV Piggyback:3000] Out: 500 [Urine:500] Intake/Output this shift: No intake/output data recorded.  General appearance: alert, cooperative, no distress and not using PCA much. GI: still a little tight, pain mid-lower abdomen this AM, pain in his back also.    Lab Results:   Recent Labs  12/11/16 1942 12/12/16 0428  WBC 15.0* 13.9*  HGB 15.0 14.8  HCT 42.5 42.7  PLT 150 156    BMET  Recent Labs  12/11/16 1326 12/11/16 1942 12/12/16 0428  NA 132*  --  138  K 4.3  --  4.9  CL 100*  --  109  CO2 22  --  24  GLUCOSE 133*  --  104*  BUN 35*  --  30*  CREATININE 1.39* 1.14 0.87  CALCIUM 8.8*  --  8.4*   PT/INR No results for input(s): LABPROT, INR in the last 72 hours.   Recent Labs Lab 12/11/16 1326 12/12/16 0428  AST 28 20  ALT 24 18  ALKPHOS 29* 26*  BILITOT 2.5* 2.1*  PROT 6.4* 5.7*  ALBUMIN 3.7 2.7*     Lipase     Component Value Date/Time   LIPASE 93 (H) 12/12/2016 0428     Studies/Results: Ct Abdomen Pelvis Wo Contrast  Result Date: 12/11/2016 CLINICAL DATA:  Abdominal pain since yesterday. EXAM: CT ABDOMEN AND PELVIS WITHOUT CONTRAST TECHNIQUE: Multidetector CT imaging of the abdomen and pelvis was performed following the standard protocol without IV contrast.  COMPARISON:  None. FINDINGS: Lower chest: Small bilateral pleural effusions and bibasilar atelectasis. The heart is mildly enlarged. Coronary artery calcifications are noted. The distal esophagus is grossly normal. Hepatobiliary: No focal hepatic lesions or intrahepatic biliary dilatation. There are gallstones and sludge noted in the gallbladder. No common bile duct dilatation. Pancreas: The pancreas is enlarged and diffusely inflamed. There is also extensive peripancreatic inflammation/phlegmon. Small fluid collections likely represent developing pseudocysts. No pancreatic mass. No pancreatic duct dilatation. No obvious obstructing common bile duct stone. There is a small duodenal diverticulum noted at near the pancreatic head. Spleen: Normal size. Simple appearing cysts noted. Peripancreatic fluid. Adrenals/Urinary Tract: The adrenal glands and kidneys are unremarkable. No renal, ureteral or bladder calculi or mass. Moderate fluid and inflammation noted in the anterior pararenal spaces, left greater than right and also fluid tracking back along the pericolic gutters. Stomach/Bowel: The stomach, duodenum, small bowel and colon are unremarkable. No inflammatory changes, mass lesions or obstructive findings. The terminal ileum is normal. The appendix is normal. Vascular/Lymphatic: No significant aortic calcifications or aneurysm. Small scattered mesenteric and retroperitoneal lymph nodes but no mass or adenopathy. Reproductive: The prostate gland and seminal vesicles are unremarkable. Other: Small to moderate amount of free pelvic fluid. No inguinal mass or adenopathy. Musculoskeletal:  No significant bony findings. IMPRESSION: CT findings consistent with acute pancreatitis as discussed above. Gallstones and gallbladder sludge are noted. No obvious common bile duct stone. Small bilateral pleural effusions and bibasilar atelectasis. Electronically Signed   By: Rudie Meyer M.D.   On: 12/11/2016 16:11   Dg Chest 2  View  Result Date: 12/11/2016 CLINICAL DATA:  Shortness of Breath EXAM: CHEST  2 VIEW COMPARISON:  None. FINDINGS: Low lung volumes with bibasilar atelectasis or infiltrates. Mild cardiomegaly. No overt edema. No effusions or acute bony abnormality. IMPRESSION: Mild cardiomegaly.  Bibasilar atelectasis or infiltrates. Electronically Signed   By: Charlett Nose M.D.   On: 12/11/2016 14:04   US Abdomen Limited Ruq  Result Date: 12/11/2016 CLINICAL DATA:  Acute pancreatitis EXAM: US ABDOMEN LIMITED - RIGHT UPPER QUADRANT COMPARISON:  CT abdomen pelvis 12/11/2016 FINDINGS: Gallbladder: Multiple gallstones measuring up to 8 mm in diameter. Gallbladder wall not significantly thickened. Positive sonographic Murphy sign. Common bile duct: Diameter: 4.5 mm Liver: No focal lesion identified. Within normal limits in parenchymal echogenicity. Small amount of fluid around the liver IMPRESSION: Cholelithiasis. Patient tender over the gallbladder compatible with positive sonographic Murphy sign. No biliary dilatation. Small amount of free fluid around the liver. Electronically Signed   By: Marlan Palau M.D.   On: 12/11/2016 17:29   Prior to Admission medications   Medication Sig Start Date End Date Taking? Authorizing Provider  ADVAIR DISKUS 250-50 MCG/DOSE AEPB Inhale 1 puff into the lungs 2 (two) times daily. 11/06/16  Yes Historical Provider, MD  clonazePAM (KLONOPIN) 0.5 MG tablet Take 1.5 mg by mouth at bedtime. 11/08/16  Yes Historical Provider, MD  montelukast (SINGULAIR) 10 MG tablet Take 10 mg by mouth at bedtime. 11/23/16  Yes Historical Provider, MD  Multiple Vitamins-Minerals (MULTIVITAMIN ADULT PO) Take 1 tablet by mouth every evening.   Yes Historical Provider, MD  naproxen sodium (ANAPROX) 220 MG tablet Take 440 mg by mouth every 12 (twelve) hours as needed (pain).   Yes Historical Provider, MD  sertraline (ZOLOFT) 100 MG tablet Take 200 mg by mouth at bedtime. 11/08/16  Yes Historical Provider, MD     Medications: . clonazePAM  1.5 mg Oral QHS  . heparin  5,000 Units Subcutaneous Q8H  . mometasone-formoterol  2 puff Inhalation BID  . montelukast  10 mg Oral QHS  . sertraline  200 mg Oral QHS   . sodium chloride 1,000 mL (12/11/16 2232)   Assessment/Plan Gallstone pancreatitis Acute kidney injury FEN: IV fluids/clears ID:  No abx DVT:  Heparin  Plan:  Go slowly on liquids, I would continue IV fluids, currently just at 20 ml/hr.  I will make him NPO after MN, and recheck labs in AM.   Cholecystectomy when his pancreatitis is better.      LOS: 1 day    Alyce Inscore 12/12/2016 [phone]

## 2016-12-12 NOTE — Progress Notes (Signed)
PROGRESS NOTE    [name]   [name]  DOB: 1956-02-07  DOA: 12/11/2016 PCP: Sissy Hoff, MD   Brief Narrative:  [name] is a 61 y.o. male with no significant past medical history that comes in for abdominal pain that started 2 days prior to admission. He relates he was nauseated and started vomiting with significant abdominal pain no hematemesis. He took several naproxen to relieve the pain and was unsuccessful. He has not been able to tolerate anything by mouth since the abdominal pain started. He denies any diarrhea, fever, constipation. He relates he cannot take a deep breath as his abdominal pain gets worse. He relates that his urine has been very dark and brown colored today he was seen by his PCP today who sent him to the ED. Found to have acute pancreatitis with phlegmon and gallstones on CT.   Subjective: Having pain across upper abdomen, none in back . No nausea or vomiting. No stool. Urinating well.   Assessment & Plan:   Principal Problem:   Acute gallstone pancreatitis - symptomatically improved- cut back on narcotics  - start clear liquids-   - allow to ambulate with assistance - gen surgery following   Active Problems:   Hyponatremia   AKI (acute kidney injury)  - prerenal - improved with IVF  Leukocytosis - improving  Mild asthma - resume steroid inhaler and Singulair   DVT prophylaxis: Heparin- switch to Lovenox Code Status: Full code Family Communication: wife Disposition Plan: follow on med/surg Consultants:   gen surgery Procedures:    Antimicrobials:  Anti-infectives    None       Objective: Vitals:   12/12/16 0542 12/12/16 0755 12/12/16 0911 12/12/16 1444  BP: 106/68   114/73  Pulse: 86   98  Resp: 18 20  18   Temp: 98.3 F (36.8 C)   98.6 F (37 C)  TempSrc: Oral   Oral  SpO2: 94% 93% 96% 95%  Weight: 93.5 kg (206 lb 2.1 oz)     Height:        Intake/Output Summary (Last 24 hours) at 12/12/16 1547 Last data  filed at 12/12/16 1445  Gross per 24 hour  Intake             2840 ml  Output              800 ml  Net             2040 ml   Filed Weights   12/11/16 1204 12/11/16 1833 12/12/16 0542  Weight: 87.1 kg (192 lb) 93.4 kg (205 lb 14.6 oz) 93.5 kg (206 lb 2.1 oz)    Examination: General exam: Appears comfortable  HEENT: PERRLA, oral mucosa moist, no sclera icterus or thrush Respiratory system: Clear to auscultation. Respiratory effort normal. Cardiovascular system: S1 & S2 heard, RRR.  No murmurs  Gastrointestinal system: Abdomen soft,  Tender across upper abdomen, nondistended. Normal bowel sound. No organomegaly Central nervous system: Alert and oriented. No focal neurological deficits. Extremities: No cyanosis, clubbing or edema Skin: No rashes or ulcers Psychiatry:  Mood & affect appropriate.     Data Reviewed: I have personally reviewed following labs and imaging studies  CBC:  Recent Labs Lab 12/11/16 1326 12/11/16 1942 12/12/16 0428  WBC 22.5* 15.0* 13.9*  NEUTROABS 20.9*  --   --   HGB 17.5* 15.0 14.8  HCT 48.6 42.5 42.7  MCV 93.3 94.2 93.4  PLT 187 150 156   Basic  Metabolic Panel:  Recent Labs Lab 12/11/16 1326 12/11/16 1942 12/12/16 0428 12/12/16 1122  NA 132*  --  138 135  K 4.3  --  4.9 4.3  CL 100*  --  109 105  CO2 22  --  24 23  GLUCOSE 133*  --  104* 132*  BUN 35*  --  30* 29*  CREATININE 1.39* 1.14 0.87 0.91  CALCIUM 8.8*  --  8.4* 8.4*   GFR: Estimated Creatinine Clearance: 97.9 mL/min (by C-G formula based on SCr of 0.91 mg/dL). Liver Function Tests:  Recent Labs Lab 12/11/16 1326 12/12/16 0428 12/12/16 1122  AST 28 20 20   ALT 24 18 18   ALKPHOS 29* 26* 31*  BILITOT 2.5* 2.1* 2.0*  PROT 6.4* 5.7* 6.2*  ALBUMIN 3.7 2.7* 3.0*    Recent Labs Lab 12/11/16 1326 12/12/16 0428  LIPASE 376* 93*   No results for input(s): AMMONIA in the last 168 hours. Coagulation Profile: No results for input(s): INR, PROTIME in the last 168  hours. Cardiac Enzymes:  Recent Labs Lab 12/11/16 1326  CKTOTAL 44*  TROPONINI <0.03   BNP (last 3 results) No results for input(s): PROBNP in the last 8760 hours. HbA1C: No results for input(s): HGBA1C in the last 72 hours. CBG: No results for input(s): GLUCAP in the last 168 hours. Lipid Profile: No results for input(s): CHOL, HDL, LDLCALC, TRIG, CHOLHDL, LDLDIRECT in the last 72 hours. Thyroid Function Tests: No results for input(s): TSH, T4TOTAL, FREET4, T3FREE, THYROIDAB in the last 72 hours. Anemia Panel: No results for input(s): VITAMINB12, FOLATE, FERRITIN, TIBC, IRON, RETICCTPCT in the last 72 hours. Urine analysis:    Component Value Date/Time   COLORURINE AMBER (A) 12/11/2016 1208   APPEARANCEUR HAZY (A) 12/11/2016 1208   LABSPEC 1.033 (H) 12/11/2016 1208   PHURINE 5.0 12/11/2016 1208   GLUCOSEU NEGATIVE 12/11/2016 1208   HGBUR NEGATIVE 12/11/2016 1208   BILIRUBINUR SMALL (A) 12/11/2016 1208   KETONESUR NEGATIVE 12/11/2016 1208   PROTEINUR 100 (A) 12/11/2016 1208   NITRITE NEGATIVE 12/11/2016 1208   LEUKOCYTESUR NEGATIVE 12/11/2016 1208   Sepsis Labs: @LABRCNTIP (procalcitonin:4,lacticidven:4) )No results found for this or any previous visit (from the past 240 hour(s)).       Radiology Studies: Ct Abdomen Pelvis Wo Contrast  Result Date: 12/11/2016 CLINICAL DATA:  Abdominal pain since yesterday. EXAM: CT ABDOMEN AND PELVIS WITHOUT CONTRAST TECHNIQUE: Multidetector CT imaging of the abdomen and pelvis was performed following the standard protocol without IV contrast. COMPARISON:  None. FINDINGS: Lower chest: Small bilateral pleural effusions and bibasilar atelectasis. The heart is mildly enlarged. Coronary artery calcifications are noted. The distal esophagus is grossly normal. Hepatobiliary: No focal hepatic lesions or intrahepatic biliary dilatation. There are gallstones and sludge noted in the gallbladder. No common bile duct dilatation. Pancreas: The  pancreas is enlarged and diffusely inflamed. There is also extensive peripancreatic inflammation/phlegmon. Small fluid collections likely represent developing pseudocysts. No pancreatic mass. No pancreatic duct dilatation. No obvious obstructing common bile duct stone. There is a small duodenal diverticulum noted at near the pancreatic head. Spleen: Normal size. Simple appearing cysts noted. Peripancreatic fluid. Adrenals/Urinary Tract: The adrenal glands and kidneys are unremarkable. No renal, ureteral or bladder calculi or mass. Moderate fluid and inflammation noted in the anterior pararenal spaces, left greater than right and also fluid tracking back along the pericolic gutters. Stomach/Bowel: The stomach, duodenum, small bowel and colon are unremarkable. No inflammatory changes, mass lesions or obstructive findings. The terminal ileum is normal. The appendix  is normal. Vascular/Lymphatic: No significant aortic calcifications or aneurysm. Small scattered mesenteric and retroperitoneal lymph nodes but no mass or adenopathy. Reproductive: The prostate gland and seminal vesicles are unremarkable. Other: Small to moderate amount of free pelvic fluid. No inguinal mass or adenopathy. Musculoskeletal: No significant bony findings. IMPRESSION: CT findings consistent with acute pancreatitis as discussed above. Gallstones and gallbladder sludge are noted. No obvious common bile duct stone. Small bilateral pleural effusions and bibasilar atelectasis. Electronically Signed   By: Rudie MeyerP.  Gallerani M.D.   On: 12/11/2016 16:11   Dg Chest 2 View  Result Date: 12/11/2016 CLINICAL DATA:  Shortness of Breath EXAM: CHEST  2 VIEW COMPARISON:  None. FINDINGS: Low lung volumes with bibasilar atelectasis or infiltrates. Mild cardiomegaly. No overt edema. No effusions or acute bony abnormality. IMPRESSION: Mild cardiomegaly.  Bibasilar atelectasis or infiltrates. Electronically Signed   By: Charlett NoseKevin  Dover M.D.   On: 12/11/2016 14:04   [place]  Abdomen Limited Ruq  Result Date: 12/11/2016 CLINICAL DATA:  Acute pancreatitis EXAM: US ABDOMEN LIMITED - RIGHT UPPER QUADRANT COMPARISON:  CT abdomen pelvis 12/11/2016 FINDINGS: Gallbladder: Multiple gallstones measuring up to 8 mm in diameter. Gallbladder wall not significantly thickened. Positive sonographic Murphy sign. Common bile duct: Diameter: 4.5 mm Liver: No focal lesion identified. Within normal limits in parenchymal echogenicity. Small amount of fluid around the liver IMPRESSION: Cholelithiasis. Patient tender over the gallbladder compatible with positive sonographic Murphy sign. No biliary dilatation. Small amount of free fluid around the liver. Electronically Signed   By: Marlan Palauharles  Clark M.D.   On: 12/11/2016 17:29      Scheduled Meds: . clonazePAM  1.5 mg Oral QHS  . enoxaparin (LOVENOX) injection  40 mg Subcutaneous Q24H  . mometasone-formoterol  2 puff Inhalation BID  . montelukast  10 mg Oral QHS  . sertraline  200 mg Oral QHS   Continuous Infusions: . sodium chloride 125 mL/hr at 12/12/16 1014     LOS: 1 day    Time spent in minutes: 35    Constantin Hillery, MD [facility] Pager: [email or web address] Password [email or web address] 12/12/2016, 3:47 PM

## 2016-12-12 NOTE — Progress Notes (Signed)
ANTICOAGULATION CONSULT NOTE - Initial Consult  Pharmacy Consult for switch from SQ heparin to lovenox Indication: VTE prophylaxis  No Known Allergies  Patient Measurements: Height: 6\' 2"  (188 cm) Weight: 206 lb 2.1 oz (93.5 kg) IBW/kg (Calculated) : 82.2 Estimated Creatinine Clearance: 97.9 mL/min (by C-G formula based on SCr of 0.91 mg/dL).  Assessment and Plan: -Last dose of heparin SQ this am at 0513 -Start lovenox 40mg  SQ q24 at 1800 -Pharmacy will sign off as dose is appropriate for indication and renal function  [name] RPh 12/12/2016, 1:22 PM Pager [phone]

## 2016-12-13 ENCOUNTER — Encounter (HOSPITAL_COMMUNITY): Admission: EM | Disposition: A | Discharge status: Home / Self Care | Payer: Self-pay | Source: Home / Self Care

## 2016-12-13 ENCOUNTER — Encounter (HOSPITAL_COMMUNITY): Payer: Self-pay | Admitting: Certified Registered"

## 2016-12-13 ENCOUNTER — Inpatient Hospital Stay (HOSPITAL_COMMUNITY): Payer: BC Managed Care – PPO

## 2016-12-13 ENCOUNTER — Inpatient Hospital Stay (HOSPITAL_COMMUNITY): Payer: BC Managed Care – PPO | Admitting: Anesthesiology

## 2016-12-13 HISTORY — PX: CHOLECYSTECTOMY: SHX55

## 2016-12-13 LAB — CBC
HEMATOCRIT: 39.3 % (ref 39.0–52.0)
HEMOGLOBIN: 13.6 g/dL (ref 13.0–17.0)
MCH: 33 pg (ref 26.0–34.0)
MCHC: 34.6 g/dL (ref 30.0–36.0)
MCV: 95.4 fL (ref 78.0–100.0)
Platelets: 164 10*3/uL (ref 150–400)
RBC: 4.12 MIL/uL — AB (ref 4.22–5.81)
RDW: 13.3 % (ref 11.5–15.5)
WBC: 9.6 10*3/uL (ref 4.0–10.5)

## 2016-12-13 LAB — SURGICAL PCR SCREEN
MRSA, PCR: NEGATIVE
Staphylococcus aureus: POSITIVE — AB

## 2016-12-13 LAB — LIPASE, BLOOD: LIPASE: 27 U/L (ref 11–51)

## 2016-12-13 SURGERY — LAPAROSCOPIC CHOLECYSTECTOMY WITH INTRAOPERATIVE CHOLANGIOGRAM
Anesthesia: General | Site: Abdomen

## 2016-12-13 MED ORDER — ACETAMINOPHEN 10 MG/ML IV SOLN
INTRAVENOUS | Status: DC | PRN
  Administered 2016-12-13: 1000 mg via INTRAVENOUS

## 2016-12-13 MED ORDER — LIDOCAINE 2% (20 MG/ML) 5 ML SYRINGE
INTRAMUSCULAR | Status: AC
  Filled 2016-12-13: qty 5

## 2016-12-13 MED ORDER — BUPIVACAINE-EPINEPHRINE 0.25% -1:200000 IJ SOLN
INTRAMUSCULAR | Status: DC | PRN
  Administered 2016-12-13: 10 mL

## 2016-12-13 MED ORDER — ACETAMINOPHEN 10 MG/ML IV SOLN
INTRAVENOUS | Status: AC
  Filled 2016-12-13: qty 100

## 2016-12-13 MED ORDER — CHLORHEXIDINE GLUCONATE CLOTH 2 % EX PADS
6.0000 | MEDICATED_PAD | Freq: Once | CUTANEOUS | Status: AC
  Administered 2016-12-13: 6 via TOPICAL

## 2016-12-13 MED ORDER — ROCURONIUM BROMIDE 10 MG/ML (PF) SYRINGE
PREFILLED_SYRINGE | INTRAVENOUS | Status: DC | PRN
  Administered 2016-12-13: 10 mg via INTRAVENOUS
  Administered 2016-12-13: 40 mg via INTRAVENOUS

## 2016-12-13 MED ORDER — PROPOFOL 10 MG/ML IV BOLUS
INTRAVENOUS | Status: AC
  Filled 2016-12-13: qty 20

## 2016-12-13 MED ORDER — IOPAMIDOL (ISOVUE-300) INJECTION 61%
INTRAVENOUS | Status: DC | PRN
  Administered 2016-12-13: 10 mL

## 2016-12-13 MED ORDER — SUGAMMADEX SODIUM 500 MG/5ML IV SOLN
INTRAVENOUS | Status: DC | PRN
  Administered 2016-12-13: 200 mg via INTRAVENOUS

## 2016-12-13 MED ORDER — SUCCINYLCHOLINE CHLORIDE 200 MG/10ML IV SOSY
PREFILLED_SYRINGE | INTRAVENOUS | Status: AC
  Filled 2016-12-13: qty 10

## 2016-12-13 MED ORDER — LIDOCAINE 2% (20 MG/ML) 5 ML SYRINGE
INTRAMUSCULAR | Status: DC | PRN
  Administered 2016-12-13: 100 mg via INTRAVENOUS

## 2016-12-13 MED ORDER — HYDROMORPHONE HCL 1 MG/ML IJ SOLN
0.2500 mg | INTRAMUSCULAR | Status: DC | PRN
  Administered 2016-12-13 (×2): 0.5 mg via INTRAVENOUS

## 2016-12-13 MED ORDER — IOPAMIDOL (ISOVUE-300) INJECTION 61%
INTRAVENOUS | Status: AC
  Filled 2016-12-13: qty 50

## 2016-12-13 MED ORDER — MIDAZOLAM HCL 2 MG/2ML IJ SOLN
INTRAMUSCULAR | Status: AC
  Filled 2016-12-13: qty 2

## 2016-12-13 MED ORDER — BUPIVACAINE-EPINEPHRINE 0.25% -1:200000 IJ SOLN
INTRAMUSCULAR | Status: AC
  Filled 2016-12-13: qty 1

## 2016-12-13 MED ORDER — ROCURONIUM BROMIDE 50 MG/5ML IV SOSY
PREFILLED_SYRINGE | INTRAVENOUS | Status: AC
  Filled 2016-12-13: qty 5

## 2016-12-13 MED ORDER — MIDAZOLAM HCL 5 MG/5ML IJ SOLN
INTRAMUSCULAR | Status: DC | PRN
  Administered 2016-12-13: 2 mg via INTRAVENOUS

## 2016-12-13 MED ORDER — DEXTROSE 5 % IV SOLN
2.0000 g | INTRAVENOUS | Status: AC
  Administered 2016-12-13: 2 g via INTRAVENOUS
  Filled 2016-12-13 (×2): qty 2

## 2016-12-13 MED ORDER — SUGAMMADEX SODIUM 200 MG/2ML IV SOLN
INTRAVENOUS | Status: AC
  Filled 2016-12-13: qty 2

## 2016-12-13 MED ORDER — HYDROMORPHONE HCL 1 MG/ML IJ SOLN
INTRAMUSCULAR | Status: AC
  Filled 2016-12-13: qty 1

## 2016-12-13 MED ORDER — LACTATED RINGERS IV SOLN
INTRAVENOUS | Status: DC | PRN
  Administered 2016-12-13: 1000 mL

## 2016-12-13 MED ORDER — MUPIROCIN 2 % EX OINT
1.0000 "application " | TOPICAL_OINTMENT | Freq: Two times a day (BID) | CUTANEOUS | Status: AC
  Administered 2016-12-13 – 2016-12-18 (×10): 1 via NASAL
  Filled 2016-12-13: qty 22

## 2016-12-13 MED ORDER — OXYCODONE HCL 5 MG PO TABS: 5.0000 mg | ORAL_TABLET | Freq: Once | ORAL | Status: DC | PRN

## 2016-12-13 MED ORDER — PROMETHAZINE HCL 25 MG/ML IJ SOLN: 6.2500 mg | INTRAMUSCULAR | Status: DC | PRN

## 2016-12-13 MED ORDER — ONDANSETRON HCL 4 MG/2ML IJ SOLN
INTRAMUSCULAR | Status: DC | PRN
  Administered 2016-12-13: 4 mg via INTRAVENOUS

## 2016-12-13 MED ORDER — FENTANYL CITRATE (PF) 100 MCG/2ML IJ SOLN
INTRAMUSCULAR | Status: DC | PRN
  Administered 2016-12-13 (×2): 50 ug via INTRAVENOUS

## 2016-12-13 MED ORDER — BUPIVACAINE HCL (PF) 0.25 % IJ SOLN
INTRAMUSCULAR | Status: AC
  Filled 2016-12-13: qty 30

## 2016-12-13 MED ORDER — PIPERACILLIN-TAZOBACTAM 3.375 G IVPB
3.3750 g | Freq: Three times a day (TID) | INTRAVENOUS | Status: DC
  Administered 2016-12-13 – 2016-12-20 (×20): 3.375 g via INTRAVENOUS
  Filled 2016-12-13 (×22): qty 50

## 2016-12-13 MED ORDER — DEXAMETHASONE SODIUM PHOSPHATE 10 MG/ML IJ SOLN
INTRAMUSCULAR | Status: DC | PRN
  Administered 2016-12-13: 10 mg via INTRAVENOUS

## 2016-12-13 MED ORDER — ONDANSETRON HCL 4 MG/2ML IJ SOLN
INTRAMUSCULAR | Status: AC
  Filled 2016-12-13: qty 2

## 2016-12-13 MED ORDER — DEXAMETHASONE SODIUM PHOSPHATE 10 MG/ML IJ SOLN
INTRAMUSCULAR | Status: AC
  Filled 2016-12-13: qty 1

## 2016-12-13 MED ORDER — OXYCODONE HCL 5 MG/5ML PO SOLN
5.0000 mg | Freq: Once | ORAL | Status: DC | PRN
  Filled 2016-12-13: qty 5

## 2016-12-13 MED ORDER — PROPOFOL 10 MG/ML IV BOLUS
INTRAVENOUS | Status: DC | PRN
  Administered 2016-12-13: 180 mg via INTRAVENOUS

## 2016-12-13 MED ORDER — CHLORHEXIDINE GLUCONATE CLOTH 2 % EX PADS
6.0000 | MEDICATED_PAD | Freq: Every day | CUTANEOUS | Status: AC
  Administered 2016-12-14 – 2016-12-16 (×4): 6 via TOPICAL

## 2016-12-13 MED ORDER — FENTANYL CITRATE (PF) 100 MCG/2ML IJ SOLN
INTRAMUSCULAR | Status: AC
  Filled 2016-12-13: qty 2

## 2016-12-13 MED ORDER — HYDROMORPHONE HCL 1 MG/ML IJ SOLN
INTRAMUSCULAR | Status: AC
  Administered 2016-12-13: 0.5 mg via INTRAVENOUS
  Filled 2016-12-13: qty 1

## 2016-12-13 SURGICAL SUPPLY — 41 items
ADH SKN CLS APL DERMABOND .7 (GAUZE/BANDAGES/DRESSINGS) ×1
APPLIER CLIP 5 13 M/L LIGAMAX5 (MISCELLANEOUS) ×3
APR CLP MED LRG 5 ANG JAW (MISCELLANEOUS) ×1
BAG SPEC RTRVL LRG 6X4 10 (ENDOMECHANICALS) ×1
CABLE HIGH FREQUENCY MONO STRZ (ELECTRODE) ×3 IMPLANT
CATH REDDICK CHOLANGI 4FR 50CM (CATHETERS) ×3 IMPLANT
CHLORAPREP W/TINT 26ML (MISCELLANEOUS) ×3 IMPLANT
CLIP APPLIE 5 13 M/L LIGAMAX5 (MISCELLANEOUS) ×1 IMPLANT
COVER MAYO STAND STRL (DRAPES) ×3 IMPLANT
COVER SURGICAL LIGHT HANDLE (MISCELLANEOUS) ×3 IMPLANT
DECANTER SPIKE VIAL GLASS SM (MISCELLANEOUS) ×3 IMPLANT
DERMABOND ADVANCED (GAUZE/BANDAGES/DRESSINGS) ×2
DERMABOND ADVANCED .7 DNX12 (GAUZE/BANDAGES/DRESSINGS) ×1 IMPLANT
DRAIN CHANNEL 19F RND (DRAIN) ×2 IMPLANT
DRAPE C-ARM 42X120 X-RAY (DRAPES) ×3 IMPLANT
DRSG TEGADERM 4X4.75 (GAUZE/BANDAGES/DRESSINGS) ×2 IMPLANT
ELECT REM PT RETURN 9FT ADLT (ELECTROSURGICAL) ×3
ELECTRODE REM PT RTRN 9FT ADLT (ELECTROSURGICAL) ×1 IMPLANT
ENDOLOOP SUT PDS II  0 18 (SUTURE) ×2
ENDOLOOP SUT PDS II 0 18 (SUTURE) IMPLANT
EVACUATOR SILICONE 100CC (DRAIN) ×2 IMPLANT
GAUZE SPONGE 2X2 8PLY STRL LF (GAUZE/BANDAGES/DRESSINGS) IMPLANT
GLOVE BIO SURGEON STRL SZ7.5 (GLOVE) ×3 IMPLANT
GOWN STRL REUS W/TWL XL LVL3 (GOWN DISPOSABLE) ×13 IMPLANT
HEMOSTAT SURGICEL 4X8 (HEMOSTASIS) IMPLANT
IRRIG SUCT STRYKERFLOW 2 WTIP (MISCELLANEOUS) ×3
IRRIGATION SUCT STRKRFLW 2 WTP (MISCELLANEOUS) ×1 IMPLANT
IV CATH 14GX2 1/4 (CATHETERS) ×3 IMPLANT
KIT BASIN OR (CUSTOM PROCEDURE TRAY) ×3 IMPLANT
POUCH SPECIMEN RETRIEVAL 10MM (ENDOMECHANICALS) ×3 IMPLANT
SCISSORS LAP 5X35 DISP (ENDOMECHANICALS) ×3 IMPLANT
SLEEVE XCEL OPT CAN 5 100 (ENDOMECHANICALS) ×6 IMPLANT
SPONGE GAUZE 2X2 STER 10/PKG (GAUZE/BANDAGES/DRESSINGS) ×2
SUT ETHILON 3 0 PS 1 (SUTURE) ×2 IMPLANT
SUT MNCRL AB 4-0 PS2 18 (SUTURE) ×3 IMPLANT
TOWEL OR 17X26 10 PK STRL BLUE (TOWEL DISPOSABLE) ×3 IMPLANT
TOWEL OR NON WOVEN STRL DISP B (DISPOSABLE) ×3 IMPLANT
TRAY LAPAROSCOPIC (CUSTOM PROCEDURE TRAY) ×3 IMPLANT
TROCAR BLADELESS OPT 5 100 (ENDOMECHANICALS) ×3 IMPLANT
TROCAR XCEL BLUNT TIP 100MML (ENDOMECHANICALS) ×3 IMPLANT
TUBING INSUF HEATED (TUBING) ×3 IMPLANT

## 2016-12-13 NOTE — Anesthesia Postprocedure Evaluation (Signed)
Anesthesia Post Note  [name]  Procedure(s) Performed: Procedure(s) (LRB): LAPAROSCOPIC CHOLECYSTECTOMY WITH INTRAOPERATIVE CHOLANGIOGRAM (N/A)  Patient location during evaluation: PACU Anesthesia Type: General Level of consciousness: sedated and patient cooperative Pain management: pain level controlled Vital Signs Assessment: post-procedure vital signs reviewed and stable Respiratory status: spontaneous breathing Cardiovascular status: stable Anesthetic complications: no       Last Vitals:  Vitals:   12/13/16 1800 12/13/16 1812  BP: 117/61 115/68  Pulse: 73 72  Resp: 16 16  Temp:  37.2 C    Last Pain:  Vitals:   12/13/16 1800  TempSrc:   PainSc: 0-No pain                 Lewie LoronJohn Asees Manfredi

## 2016-12-13 NOTE — Interval H&P Note (Signed)
History and Physical Interval Note:  12/13/2016 2:01 PM  [name]  has presented today for surgery, with the diagnosis of GALLSTONES  The various methods of treatment have been discussed with the patient and family. After consideration of risks, benefits and other options for treatment, the patient has consented to  Procedure(s): LAPAROSCOPIC CHOLECYSTECTOMY WITH INTRAOPERATIVE CHOLANGIOGRAM (N/A) as a surgical intervention .  The patient's history has been reviewed, patient examined, no change in status, stable for surgery.  I have reviewed the patient's chart and labs.  Questions were answered to the patient's satisfaction.     [name],Cullan Launer S   

## 2016-12-13 NOTE — Op Note (Signed)
12/11/2016 - 12/13/2016  4:39 PM  PATIENT:  [name]  61 y.o. male  PRE-OPERATIVE DIAGNOSIS:  GALLSTONES  POST-OPERATIVE DIAGNOSIS:  GALLSTONES  PROCEDURE:  Procedure(s): LAPAROSCOPIC CHOLECYSTECTOMY WITH INTRAOPERATIVE CHOLANGIOGRAM (N/A)  SURGEON:  Surgeon(s) and Role:    * Griselda Miner, MD - Primary  PHYSICIAN ASSISTANT:   ASSISTANTS: Evangeline Gula, PA   ANESTHESIA:   local and general  EBL:  Total I/O In: -  Out: 500 [Urine:500]  BLOOD ADMINISTERED:none  DRAINS: (1) Jackson-Pratt drain(s) with closed bulb suction in the gallbladder bed of liver   LOCAL MEDICATIONS USED:  MARCAINE     SPECIMEN:  Source of Specimen:  gallbladder  DISPOSITION OF SPECIMEN:  PATHOLOGY  COUNTS:  YES  TOURNIQUET:  * No tourniquets in log *  DICTATION: .Dragon Dictation   Procedure: After informed consent was obtained the patient was brought to the operating room and placed in the supine position on the operating room table. After adequate induction of general anesthesia the patient's abdomen was prepped with ChloraPrep allowed to dry and draped in usual sterile manner. The area below the umbilicus was infiltrated with quarter percent  Marcaine. A small incision was made with a 15 blade knife. The incision was carried down through the subcutaneous tissue bluntly with a hemostat and [employer] retractors. The linea alba was identified. The linea alba was incised with a 15 blade knife and each side was grasped with Coker clamps. The preperitoneal space was then probed with a hemostat until the peritoneum was opened and access was gained to the abdominal cavity. A 0 Vicryl pursestring stitch was placed in the fascia surrounding the opening. A Hassan cannula was then placed through the opening and anchored in place with the previously placed Vicryl purse string stitch. The abdomen was insufflated with carbon dioxide without difficulty. A laparoscope was inserted through the [facility] cannula in the  right upper quadrant was inspected. Next the epigastric region was infiltrated with % Marcaine. A small incision was made with a 15 blade knife. A 5 mm port was placed bluntly through this incision into the abdominal cavity under direct vision. Next 2 sites were chosen laterally on the right side of the abdomen for placement of 5 mm ports. Each of these areas was infiltrated with quarter percent Marcaine. Small stab incisions were made with a 15 blade knife. 5 mm ports were then placed bluntly through these incisions into the abdominal cavity under direct vision without difficulty. A blunt grasper was placed through the lateralmost 5 mm port and used to grasp the dome of the gallbladder and elevated anteriorly and superiorly. Another blunt grasper was placed through the other 5 mm port and used to retract the body and neck of the gallbladder. A dissector was placed through the epigastric port and using the electrocautery the peritoneal reflection at the gallbladder neck was opened. During this dissection I encountered a small abscess cavity in the RUQ at the edge of the liver. The colon looked normal and soft. The gallbladder was inflamed and thick walled with edema and cloudy fluid and there was some saponification of the omental fat but no good source of the abscess was appreciated. The stomach and duodenum also looked normal. Blunt dissection was then carried out in this area until the gallbladder neck-cystic duct junction was readily identified and a good window was created. A single clip was placed on the gallbladder neck. A small  ductotomy was made just below the clip with laparoscopic scissors. A  14-gauge Angiocath was then placed through the anterior abdominal wall under direct vision. A Reddick cholangiogram catheter was then placed through the Angiocath and flushed. The catheter was then placed in the cystic duct and anchored in place with a clip. A cholangiogram was obtained that showed no filling  defects good emptying into the duodenum an adequate length on the cystic duct. The anchoring clip and catheters were then removed from the patient. 3 clips were placed proximally on the cystic duct and the duct was divided between the 2 sets of clips. Posterior to this the cystic artery was identified and again dissected bluntly in a circumferential manner until a good window  was created. 2 clips were placed proximally and one distally on the artery and the artery was divided between the 2 sets of clips. Next a laparoscopic hook cautery device was used to separate the gallbladder from the liver bed. Prior to completely detaching the gallbladder from the liver bed the liver bed was inspected and several small bleeding points were coagulated with the electrocautery until the area was completely hemostatic. The gallbladder was then detached the rest of it from the liver bed without difficulty. A laparoscopic bag was inserted through the hassan port. The laparoscope was moved to the epigastric port. The gallbladder was placed within the bag and the bag was sealed.  The bag with the gallbladder was then [facility] with the [facility] through the infraumbilical port without difficulty. A 19French blake drain was left in the RUQ where the abscess and gallbladder used to be. The drain was anchored to the skin with a 3-0 Nylon stitch. The fascial defect was then closed with the previously placed Vicryl pursestring stitch as well as with another figure-of-eight 0 Vicryl stitch. The liver bed was inspected again and found to be hemostatic. The abdomen was irrigated with copious amounts of saline until the effluent was clear. The ports were then removed under direct vision without difficulty and were found to be hemostatic. The gas was allowed to escape. The skin incisions were all closed with interrupted 4-0 Monocryl subcuticular stitches. Dermabond dressings were applied. The patient tolerated the procedure well. At the end  of the case all needle sponge and instrument counts were correct. The patient was then awakened and taken to recovery in stable condition  PLAN OF CARE: Admit to inpatient   PATIENT DISPOSITION:  PACU - hemodynamically stable.   Delay start of Pharmacological VTE agent (>24hrs) due to surgical blood loss or risk of bleeding: no

## 2016-12-13 NOTE — Interval H&P Note (Signed)
History and Physical Interval Note:  12/13/2016 2:01 PM  [name]  has presented today for surgery, with the diagnosis of GALLSTONES  The various methods of treatment have been discussed with the patient and family. After consideration of risks, benefits and other options for treatment, the patient has consented to  Procedure(s): LAPAROSCOPIC CHOLECYSTECTOMY WITH INTRAOPERATIVE CHOLANGIOGRAM (N/A) as a surgical intervention .  The patient's history has been reviewed, patient examined, no change in status, stable for surgery.  I have reviewed the patient's chart and labs.  Questions were answered to the patient's satisfaction.     [name],PAUL S

## 2016-12-13 NOTE — Anesthesia Preprocedure Evaluation (Addendum)
Anesthesia Evaluation  Patient identified by MRN, date of birth, ID band Patient awake    Reviewed: Allergy & Precautions, NPO status , Patient's Chart, lab work & pertinent test results  Airway Mallampati: II  TM Distance: >3 FB Neck ROM: Full    Dental no notable dental hx. (+) Missing, Dental Advisory Given, Poor Dentition   Pulmonary neg pulmonary ROS, asthma ,    Pulmonary exam normal breath sounds clear to auscultation       Cardiovascular negative cardio ROS Normal cardiovascular exam Rhythm:Regular Rate:Normal     Neuro/Psych Anxiety Depression negative neurological ROS  negative psych ROS   GI/Hepatic negative GI ROS, Neg liver ROS,   Endo/Other  negative endocrine ROS  Renal/GU negative Renal ROS  negative genitourinary   Musculoskeletal negative musculoskeletal ROS (+)   Abdominal   Peds negative pediatric ROS (+)  Hematology negative hematology ROS (+)   Anesthesia Other Findings   Reproductive/Obstetrics negative OB ROS                            Anesthesia Physical Anesthesia Plan  ASA: II  Anesthesia Plan: General   Post-op Pain Management:    Induction: Intravenous, Rapid sequence and Cricoid pressure planned  Airway Management Planned: Oral ETT  Additional Equipment:   Intra-op Plan:   Post-operative Plan: Extubation in OR  Informed Consent: I have reviewed the patients History and Physical, chart, labs and discussed the procedure including the risks, benefits and alternatives for the proposed anesthesia with the patient or authorized representative who has indicated his/her understanding and acceptance.   Dental advisory given  Plan Discussed with: CRNA  Anesthesia Plan Comments:        Anesthesia Quick Evaluation

## 2016-12-13 NOTE — Transfer of Care (Signed)
Immediate Anesthesia Transfer of Care Note  [name]  Procedure(s) Performed: Procedure(s): LAPAROSCOPIC CHOLECYSTECTOMY WITH INTRAOPERATIVE CHOLANGIOGRAM (N/A)  Patient Location: PACU  Anesthesia Type:General  Level of Consciousness: awake, alert  and oriented  Airway & Oxygen Therapy: Patient Spontanous Breathing and Patient connected to face mask oxygen  Post-op Assessment: Report given to RN and Post -op Vital signs reviewed and stable  Post vital signs: Reviewed and stable  Last Vitals:  Vitals:   12/12/16 2050 12/13/16 0610  BP: 136/86 (!) 112/92  Pulse: 82 72  Resp: 20 20  Temp: 37 C 37 C    Last Pain:  Vitals:   12/13/16 0642  TempSrc:   PainSc: Asleep      Patients Stated Pain Goal: 2 (12/13/16 0612)  Complications: No apparent anesthesia complications

## 2016-12-13 NOTE — Progress Notes (Signed)
Subjective: Seen and evaluated by Dr. Carolynne Edouard, plan surgery later today.    Objective: Vital signs in last 24 hours: Temp:  [98.6 F (37 C)] 98.6 F (37 C) (03/08 0610) Pulse Rate:  [72-98] 72 (03/08 0610) Resp:  [18-20] 20 (03/08 0610) BP: (112-136)/(73-92) 112/92 (03/08 0610) SpO2:  [91 %-96 %] 92 % (03/08 0800) Weight:  [93.8 kg (206 lb 12.7 oz)] 93.8 kg (206 lb 12.7 oz) (03/08 0610) Last BM Date: 12/09/16 600 PO 770 PO Lipase down to 27, WBC is normal      Intake/Output from previous day: 03/07 0701 - 03/08 0700 In: 1368.8 [P.O.:600; I.V.:768.8] Out: 850 [Urine:850] Intake/Output this shift: No intake/output data recorded.  General appearance: alert, cooperative and no distress GI: less distended and tender. examined by Dr. Carolynne Edouard  Lab Results:   Recent Labs  12/12/16 0428 12/13/16 0414  WBC 13.9* 9.6  HGB 14.8 13.6  HCT 42.7 39.3  PLT 156 164    BMET  Recent Labs  12/12/16 0428 12/12/16 1122  NA 138 135  K 4.9 4.3  CL 109 105  CO2 24 23  GLUCOSE 104* 132*  BUN 30* 29*  CREATININE 0.87 0.91  CALCIUM 8.4* 8.4*   PT/INR No results for input(s): LABPROT, INR in the last 72 hours.   Recent Labs Lab 12/11/16 1326 12/12/16 0428 12/12/16 1122  AST 28 20 20   ALT 24 18 18   ALKPHOS 29* 26* 31*  BILITOT 2.5* 2.1* 2.0*  PROT 6.4* 5.7* 6.2*  ALBUMIN 3.7 2.7* 3.0*     Lipase     Component Value Date/Time   LIPASE 27 12/13/2016 0414     Studies/Results: Ct Abdomen Pelvis Wo Contrast  Result Date: 12/11/2016 CLINICAL DATA:  Abdominal pain since yesterday. EXAM: CT ABDOMEN AND PELVIS WITHOUT CONTRAST TECHNIQUE: Multidetector CT imaging of the abdomen and pelvis was performed following the standard protocol without IV contrast. COMPARISON:  None. FINDINGS: Lower chest: Small bilateral pleural effusions and bibasilar atelectasis. The heart is mildly enlarged. Coronary artery calcifications are noted. The distal esophagus is grossly normal.  Hepatobiliary: No focal hepatic lesions or intrahepatic biliary dilatation. There are gallstones and sludge noted in the gallbladder. No common bile duct dilatation. Pancreas: The pancreas is enlarged and diffusely inflamed. There is also extensive peripancreatic inflammation/phlegmon. Small fluid collections likely represent developing pseudocysts. No pancreatic mass. No pancreatic duct dilatation. No obvious obstructing common bile duct stone. There is a small duodenal diverticulum noted at near the pancreatic head. Spleen: Normal size. Simple appearing cysts noted. Peripancreatic fluid. Adrenals/Urinary Tract: The adrenal glands and kidneys are unremarkable. No renal, ureteral or bladder calculi or mass. Moderate fluid and inflammation noted in the anterior pararenal spaces, left greater than right and also fluid tracking back along the pericolic gutters. Stomach/Bowel: The stomach, duodenum, small bowel and colon are unremarkable. No inflammatory changes, mass lesions or obstructive findings. The terminal ileum is normal. The appendix is normal. Vascular/Lymphatic: No significant aortic calcifications or aneurysm. Small scattered mesenteric and retroperitoneal lymph nodes but no mass or adenopathy. Reproductive: The prostate gland and seminal vesicles are unremarkable. Other: Small to moderate amount of free pelvic fluid. No inguinal mass or adenopathy. Musculoskeletal: No significant bony findings. IMPRESSION: CT findings consistent with acute pancreatitis as discussed above. Gallstones and gallbladder sludge are noted. No obvious common bile duct stone. Small bilateral pleural effusions and bibasilar atelectasis. Electronically Signed   By: Rudie Meyer M.D.   On: 12/11/2016 16:11   Dg Chest 2 View  Result Date: 12/11/2016 CLINICAL DATA:  Shortness of Breath EXAM: CHEST  2 VIEW COMPARISON:  None. FINDINGS: Low lung volumes with bibasilar atelectasis or infiltrates. Mild cardiomegaly. No overt edema. No  effusions or acute bony abnormality. IMPRESSION: Mild cardiomegaly.  Bibasilar atelectasis or infiltrates. Electronically Signed   By: Charlett NoseKevin  Dover M.D.   On: 12/11/2016 14:04   [place] Abdomen Limited Ruq  Result Date: 12/11/2016 CLINICAL DATA:  Acute pancreatitis EXAM: US ABDOMEN LIMITED - RIGHT UPPER QUADRANT COMPARISON:  CT abdomen pelvis 12/11/2016 FINDINGS: Gallbladder: Multiple gallstones measuring up to 8 mm in diameter. Gallbladder wall not significantly thickened. Positive sonographic Murphy sign. Common bile duct: Diameter: 4.5 mm Liver: No focal lesion identified. Within normal limits in parenchymal echogenicity. Small amount of fluid around the liver IMPRESSION: Cholelithiasis. Patient tender over the gallbladder compatible with positive sonographic Murphy sign. No biliary dilatation. Small amount of free fluid around the liver. Electronically Signed   By: Marlan Palauharles  Clark M.D.   On: 12/11/2016 17:29    Medications: . clonazePAM  1.5 mg Oral QHS  . enoxaparin (LOVENOX) injection  40 mg Subcutaneous Q24H  . mometasone-formoterol  2 puff Inhalation BID  . montelukast  10 mg Oral QHS  . sertraline  200 mg Oral QHS    Assessment/Plan Gallstone pancreatitis Acute kidney injury FEN: IV fluids/clears ID:  No abx DVT:  Heparin    Plan:: Laparoscopic cholecystectomy later today.     LOS: 2 days    Dejanique Ruehl 12/13/2016 [phone]

## 2016-12-13 NOTE — H&P (View-Only) (Signed)
Subjective: Seen and evaluated by Dr. Carolynne Edouard, plan surgery later today.    Objective: Vital signs in last 24 hours: Temp:  [98.6 F (37 C)] 98.6 F (37 C) (03/08 0610) Pulse Rate:  [72-98] 72 (03/08 0610) Resp:  [18-20] 20 (03/08 0610) BP: (112-136)/(73-92) 112/92 (03/08 0610) SpO2:  [91 %-96 %] 92 % (03/08 0800) Weight:  [93.8 kg (206 lb 12.7 oz)] 93.8 kg (206 lb 12.7 oz) (03/08 0610) Last BM Date: 12/09/16 600 PO 770 PO Lipase down to 27, WBC is normal      Intake/Output from previous day: 03/07 0701 - 03/08 0700 In: 1368.8 [P.O.:600; I.V.:768.8] Out: 850 [Urine:850] Intake/Output this shift: No intake/output data recorded.  General appearance: alert, cooperative and no distress GI: less distended and tender. examined by Dr. Carolynne Edouard  Lab Results:   Recent Labs  12/12/16 0428 12/13/16 0414  WBC 13.9* 9.6  HGB 14.8 13.6  HCT 42.7 39.3  PLT 156 164    BMET  Recent Labs  12/12/16 0428 12/12/16 1122  NA 138 135  K 4.9 4.3  CL 109 105  CO2 24 23  GLUCOSE 104* 132*  BUN 30* 29*  CREATININE 0.87 0.91  CALCIUM 8.4* 8.4*   PT/INR No results for input(s): LABPROT, INR in the last 72 hours.   Recent Labs Lab 12/11/16 1326 12/12/16 0428 12/12/16 1122  AST 28 20 20   ALT 24 18 18   ALKPHOS 29* 26* 31*  BILITOT 2.5* 2.1* 2.0*  PROT 6.4* 5.7* 6.2*  ALBUMIN 3.7 2.7* 3.0*     Lipase     Component Value Date/Time   LIPASE 27 12/13/2016 0414     Studies/Results: Ct Abdomen Pelvis Wo Contrast  Result Date: 12/11/2016 CLINICAL DATA:  Abdominal pain since yesterday. EXAM: CT ABDOMEN AND PELVIS WITHOUT CONTRAST TECHNIQUE: Multidetector CT imaging of the abdomen and pelvis was performed following the standard protocol without IV contrast. COMPARISON:  None. FINDINGS: Lower chest: Small bilateral pleural effusions and bibasilar atelectasis. The heart is mildly enlarged. Coronary artery calcifications are noted. The distal esophagus is grossly normal.  Hepatobiliary: No focal hepatic lesions or intrahepatic biliary dilatation. There are gallstones and sludge noted in the gallbladder. No common bile duct dilatation. Pancreas: The pancreas is enlarged and diffusely inflamed. There is also extensive peripancreatic inflammation/phlegmon. Small fluid collections likely represent developing pseudocysts. No pancreatic mass. No pancreatic duct dilatation. No obvious obstructing common bile duct stone. There is a small duodenal diverticulum noted at near the pancreatic head. Spleen: Normal size. Simple appearing cysts noted. Peripancreatic fluid. Adrenals/Urinary Tract: The adrenal glands and kidneys are unremarkable. No renal, ureteral or bladder calculi or mass. Moderate fluid and inflammation noted in the anterior pararenal spaces, left greater than right and also fluid tracking back along the pericolic gutters. Stomach/Bowel: The stomach, duodenum, small bowel and colon are unremarkable. No inflammatory changes, mass lesions or obstructive findings. The terminal ileum is normal. The appendix is normal. Vascular/Lymphatic: No significant aortic calcifications or aneurysm. Small scattered mesenteric and retroperitoneal lymph nodes but no mass or adenopathy. Reproductive: The prostate gland and seminal vesicles are unremarkable. Other: Small to moderate amount of free pelvic fluid. No inguinal mass or adenopathy. Musculoskeletal: No significant bony findings. IMPRESSION: CT findings consistent with acute pancreatitis as discussed above. Gallstones and gallbladder sludge are noted. No obvious common bile duct stone. Small bilateral pleural effusions and bibasilar atelectasis. Electronically Signed   By: Rudie Meyer M.D.   On: 12/11/2016 16:11   Dg Chest 2 View  Result Date: 12/11/2016 CLINICAL DATA:  Shortness of Breath EXAM: CHEST  2 VIEW COMPARISON:  None. FINDINGS: Low lung volumes with bibasilar atelectasis or infiltrates. Mild cardiomegaly. No overt edema. No  effusions or acute bony abnormality. IMPRESSION: Mild cardiomegaly.  Bibasilar atelectasis or infiltrates. Electronically Signed   By: Charlett NoseKevin  Dover M.D.   On: 12/11/2016 14:04   [place] Abdomen Limited Ruq  Result Date: 12/11/2016 CLINICAL DATA:  Acute pancreatitis EXAM: US ABDOMEN LIMITED - RIGHT UPPER QUADRANT COMPARISON:  CT abdomen pelvis 12/11/2016 FINDINGS: Gallbladder: Multiple gallstones measuring up to 8 mm in diameter. Gallbladder wall not significantly thickened. Positive sonographic Murphy sign. Common bile duct: Diameter: 4.5 mm Liver: No focal lesion identified. Within normal limits in parenchymal echogenicity. Small amount of fluid around the liver IMPRESSION: Cholelithiasis. Patient tender over the gallbladder compatible with positive sonographic Murphy sign. No biliary dilatation. Small amount of free fluid around the liver. Electronically Signed   By: Marlan Palauharles  Clark M.D.   On: 12/11/2016 17:29    Medications: . clonazePAM  1.5 mg Oral QHS  . enoxaparin (LOVENOX) injection  40 mg Subcutaneous Q24H  . mometasone-formoterol  2 puff Inhalation BID  . montelukast  10 mg Oral QHS  . sertraline  200 mg Oral QHS    Assessment/Plan Gallstone pancreatitis Acute kidney injury FEN: IV fluids/clears ID:  No abx DVT:  Heparin    Plan:: Laparoscopic cholecystectomy later today.     LOS: 2 days    Mickle Campton 12/13/2016 [phone]

## 2016-12-13 NOTE — Anesthesia Procedure Notes (Signed)
Procedure Name: Intubation Date/Time: 12/13/2016 3:22 PM Performed by: Noralyn Pick D Pre-anesthesia Checklist: Patient identified, Emergency Drugs available, Suction available and Patient being monitored Patient Re-evaluated:Patient Re-evaluated prior to inductionOxygen Delivery Method: Circle system utilized Preoxygenation: Pre-oxygenation with 100% oxygen Intubation Type: IV induction Ventilation: Mask ventilation without difficulty Laryngoscope Size: Mac, 4 and Glidescope Grade View: Grade I Tube type: Glide rite Tube size: 7.5 mm Number of attempts: 1 Airway Equipment and Method: Video-laryngoscopy and Rigid stylet Placement Confirmation: ETT inserted through vocal cords under direct vision,  positive ETCO2 and breath sounds checked- equal and bilateral Secured at: 22 cm Tube secured with: Tape Dental Injury: Teeth and Oropharynx as per pre-operative assessment

## 2016-12-14 ENCOUNTER — Encounter (HOSPITAL_COMMUNITY): Payer: Self-pay | Admitting: General Surgery

## 2016-12-14 NOTE — Progress Notes (Signed)
Patient ID: [name], male   DOB: April 14, 1956, 61 y.o.   MRN: [id number]  [facility] Surgery Progress Note  1 Day Post-Op  Subjective: Patient states that he feels great today. Abdomen is sore but not painful. He is tolerating clears. No flatus or BM. No n/v.  Objective: Vital signs in last 24 hours: Temp:  [97.3 F (36.3 C)-99.5 F (37.5 C)] 97.3 F (36.3 C) (03/09 0538) Pulse Rate:  [72-86] 73 (03/09 0538) Resp:  [15-24] 17 (03/09 0538) BP: (115-142)/(61-76) 124/76 (03/09 0538) SpO2:  [88 %-99 %] 93 % (03/09 0914) FiO2 (%):  [10 %-15 %] 15 % (03/08 1715) Weight:  [209 lb 14.1 oz (95.2 kg)] 209 lb 14.1 oz (95.2 kg) (03/09 0538) Last BM Date: 12/09/16  Intake/Output from previous day: 03/08 0701 - 03/09 0700 In: 2510.4 [I.V.:2460.4; IV Piggyback:50] Out: 1760 [Urine:1750; Drains:10] Intake/Output this shift: Total I/O In: 240 [P.O.:240] Out: 380 [Urine:350; Drains:30]  PE: Gen:  Alert, NAD, pleasant Pulm:  Effort normal Abd: Soft, distended, +BS, multiple lap incisions C/D/I, drain with minimal serous/slightly cloudy drainage  Lab Results:   Recent Labs  12/12/16 0428 12/13/16 0414  WBC 13.9* 9.6  HGB 14.8 13.6  HCT 42.7 39.3  PLT 156 164   BMET  Recent Labs  12/12/16 0428 12/12/16 1122  NA 138 135  K 4.9 4.3  CL 109 105  CO2 24 23  GLUCOSE 104* 132*  BUN 30* 29*  CREATININE 0.87 0.91  CALCIUM 8.4* 8.4*   PT/INR No results for input(s): LABPROT, INR in the last 72 hours. CMP     Component Value Date/Time   NA 135 12/12/2016 1122   K 4.3 12/12/2016 1122   CL 105 12/12/2016 1122   CO2 23 12/12/2016 1122   GLUCOSE 132 (H) 12/12/2016 1122   BUN 29 (H) 12/12/2016 1122   CREATININE 0.91 12/12/2016 1122   CALCIUM 8.4 (L) 12/12/2016 1122   PROT 6.2 (L) 12/12/2016 1122   ALBUMIN 3.0 (L) 12/12/2016 1122   AST 20 12/12/2016 1122   ALT 18 12/12/2016 1122   ALKPHOS 31 (L) 12/12/2016 1122   BILITOT 2.0 (H) 12/12/2016 1122   GFRNONAA >60  12/12/2016 1122   GFRAA >60 12/12/2016 1122   Lipase     Component Value Date/Time   LIPASE 27 12/13/2016 0414       Studies/Results: Dg Cholangiogram Operative  Result Date: 12/13/2016 CLINICAL DATA:  61 year old male with a history of cholecystitis EXAM: INTRAOPERATIVE CHOLANGIOGRAM TECHNIQUE: Cholangiographic images from the C-arm fluoroscopic device were submitted for interpretation post-operatively. Please see the procedural report for the amount of contrast and the fluoroscopy time utilized. COMPARISON:  Ultrasound 12/11/2016 FINDINGS: Surgical instruments project over the upper abdomen. There is cannulation of the cystic duct/gallbladder neck, with antegrade infusion of contrast. Caliber of the extrahepatic ductal system within normal limits. No large filling defect identified. Free flow of contrast across the ampulla. IMPRESSION: Intraoperative cholangiogram demonstrates extrahepatic biliary ducts of unremarkable caliber, with no large filling defect identified. Free flow of contrast across the ampulla. Please refer to the dictated operative report for full details of intraoperative findings and procedure Electronically Signed   By: Gilmer Mor D.O.   On: 12/13/2016 16:31    Anti-infectives: Anti-infectives    Start     Dose/Rate Route Frequency Ordered Stop   12/13/16 2000  piperacillin-tazobactam (ZOSYN) IVPB 3.375 g     3.375 g 12.5 mL/hr over 240 Minutes Intravenous Every 8 hours 12/13/16 1816  12/13/16 0930  cefTRIAXone (ROCEPHIN) 2 g in dextrose 5 % 50 mL IVPB     2 g 100 mL/hr over 30 Minutes Intravenous On call to O.R. 12/13/16 0848 12/13/16 1517       Assessment/Plan GALLSTONE PANCREATITIS S/p LAPAROSCOPIC CHOLECYSTECTOMY WITH INTRAOPERATIVE CHOLANGIO GRAM (N/A) 3/8 Dr. Carolynne Edouardoth - POD 1 - drain with 10cc serous/slightly cloudy output  ID - rocephin 3/8>>3/8, zosyn 3/8>> FEN - IVF, clear liquids VTE - SCDs, lovenox  Plan - Patient is tolerating clears but is  still very distended. Continue clears for today and encourage ambulation. Advance diet slowly. Continue drain.   LOS: 3 days    Edson SnowballBROOKE A MILLER , Robert J. Dole Va Medical CenterA-C [facility] 12/14/2016, 1:22 PM Pager: [phone] Consults: [phone] Mon-Fri 7:00 am-4:30 pm Sat-Sun 7:00 am-11:30 am

## 2016-12-15 NOTE — Progress Notes (Signed)
Incentive spirometer encouraged, reached 1000 cc. Will continue to monitor.

## 2016-12-15 NOTE — Progress Notes (Signed)
JP drain dsg changed this shift.Site unremarkable.

## 2016-12-15 NOTE — Progress Notes (Signed)
Out of bed to BR 3x this shift. Refused to ambulate in the hallway. Will endorsed to night nurse.

## 2016-12-15 NOTE — Progress Notes (Signed)
2 Days Post-Op  Subjective: Complains of some back soreness  Objective: Vital signs in last 24 hours: Temp:  [97.9 F (36.6 C)-98.7 F (37.1 C)] 98.7 F (37.1 C) (03/10 0501) Pulse Rate:  [73-81] 81 (03/10 0501) Resp:  [16-18] 18 (03/10 0501) BP: (126-143)/(71-89) 143/89 (03/10 0501) SpO2:  [90 %-94 %] 91 % (03/10 0501) Last BM Date: 12/14/16  Intake/Output from previous day: 03/09 0701 - 03/10 0700 In: 4793.3 [P.O.:960; I.V.:3633.3; IV Piggyback:200] Out: 1760 [Urine:1700; Drains:60] Intake/Output this shift: No intake/output data recorded.  Resp: clear to auscultation bilaterally Cardio: regular rate and rhythm GI: soft, mild tenderness. incisions look good. drain output serous  Lab Results:   Recent Labs  12/13/16 0414  WBC 9.6  HGB 13.6  HCT 39.3  PLT 164   BMET  Recent Labs  12/12/16 1122  NA 135  K 4.3  CL 105  CO2 23  GLUCOSE 132*  BUN 29*  CREATININE 0.91  CALCIUM 8.4*   PT/INR No results for input(s): LABPROT, INR in the last 72 hours. ABG No results for input(s): PHART, HCO3 in the last 72 hours.  Invalid input(s): PCO2, PO2  Studies/Results: Dg Cholangiogram Operative  Result Date: 12/13/2016 CLINICAL DATA:  61 year old male with a history of cholecystitis EXAM: INTRAOPERATIVE CHOLANGIOGRAM TECHNIQUE: Cholangiographic images from the C-arm fluoroscopic device were submitted for interpretation post-operatively. Please see the procedural report for the amount of contrast and the fluoroscopy time utilized. COMPARISON:  Ultrasound 12/11/2016 FINDINGS: Surgical instruments project over the upper abdomen. There is cannulation of the cystic duct/gallbladder neck, with antegrade infusion of contrast. Caliber of the extrahepatic ductal system within normal limits. No large filling defect identified. Free flow of contrast across the ampulla. IMPRESSION: Intraoperative cholangiogram demonstrates extrahepatic biliary ducts of unremarkable caliber, with no  large filling defect identified. Free flow of contrast across the ampulla. Please refer to the dictated operative report for full details of intraoperative findings and procedure Electronically Signed   By: Gilmer MorJaime  Wagner D.O.   On: 12/13/2016 16:31    Anti-infectives: Anti-infectives    Start     Dose/Rate Route Frequency Ordered Stop   12/13/16 2000  piperacillin-tazobactam (ZOSYN) IVPB 3.375 g     3.375 g 12.5 mL/hr over 240 Minutes Intravenous Every 8 hours 12/13/16 1816     12/13/16 0930  cefTRIAXone (ROCEPHIN) 2 g in dextrose 5 % 50 mL IVPB     2 g 100 mL/hr over 30 Minutes Intravenous On call to O.R. 12/13/16 0848 12/13/16 1517      Assessment/Plan: s/p Procedure(s): LAPAROSCOPIC CHOLECYSTECTOMY WITH INTRAOPERATIVE CHOLANGIOGRAM (N/A) Advance diet  Continue drain and zosyn ambulate  LOS: 4 days    [name],PAUL S 12/15/2016

## 2016-12-16 LAB — CBC WITH DIFFERENTIAL/PLATELET
Basophils Absolute: 0 10*3/uL (ref 0.0–0.1)
Basophils Relative: 0 %
EOS ABS: 0 10*3/uL (ref 0.0–0.7)
Eosinophils Relative: 0 %
HCT: 32.4 % — ABNORMAL LOW (ref 39.0–52.0)
HEMOGLOBIN: 11.3 g/dL — AB (ref 13.0–17.0)
LYMPHS ABS: 1.1 10*3/uL (ref 0.7–4.0)
Lymphocytes Relative: 6 %
MCH: 32.8 pg (ref 26.0–34.0)
MCHC: 34.9 g/dL (ref 30.0–36.0)
MCV: 93.9 fL (ref 78.0–100.0)
MONO ABS: 1 10*3/uL (ref 0.1–1.0)
MONOS PCT: 6 %
NEUTROS PCT: 88 %
Neutro Abs: 15.1 10*3/uL — ABNORMAL HIGH (ref 1.7–7.7)
Platelets: 149 10*3/uL — ABNORMAL LOW (ref 150–400)
RBC: 3.45 MIL/uL — ABNORMAL LOW (ref 4.22–5.81)
RDW: 13.5 % (ref 11.5–15.5)
WBC: 17.2 10*3/uL — ABNORMAL HIGH (ref 4.0–10.5)

## 2016-12-16 LAB — COMPREHENSIVE METABOLIC PANEL
ALK PHOS: 60 U/L (ref 38–126)
ALT: 26 U/L (ref 17–63)
ANION GAP: 6 (ref 5–15)
AST: 28 U/L (ref 15–41)
Albumin: 2 g/dL — ABNORMAL LOW (ref 3.5–5.0)
BUN: 19 mg/dL (ref 6–20)
CALCIUM: 7.6 mg/dL — AB (ref 8.9–10.3)
CHLORIDE: 103 mmol/L (ref 101–111)
CO2: 26 mmol/L (ref 22–32)
CREATININE: 0.74 mg/dL (ref 0.61–1.24)
Glucose, Bld: 103 mg/dL — ABNORMAL HIGH (ref 65–99)
Potassium: 3.2 mmol/L — ABNORMAL LOW (ref 3.5–5.1)
Sodium: 135 mmol/L (ref 135–145)
Total Bilirubin: 1.8 mg/dL — ABNORMAL HIGH (ref 0.3–1.2)
Total Protein: 4.6 g/dL — ABNORMAL LOW (ref 6.5–8.1)

## 2016-12-16 LAB — LIPASE, BLOOD: LIPASE: 22 U/L (ref 11–51)

## 2016-12-16 NOTE — Progress Notes (Signed)
Patient out of bed in the recliner chair,chair alarm placed. Will continue to monitor.

## 2016-12-16 NOTE — Progress Notes (Signed)
3 Days Post-Op  Subjective: Seems a little more tired today but oriented. Wants food. Had bm overnight  Objective: Vital signs in last 24 hours: Temp:  [97.8 F (36.6 C)-98.5 F (36.9 C)] 97.8 F (36.6 C) (03/11 0804) Pulse Rate:  [57-74] 60 (03/11 0804) Resp:  [16-20] 19 (03/11 0804) BP: (122-134)/(65-75) 134/70 (03/11 0804) SpO2:  [94 %-97 %] 97 % (03/11 0804) Weight:  [98.7 kg (217 lb 11.2 oz)] 98.7 kg (217 lb 11.2 oz) (03/11 0537) Last BM Date: 12/15/16  Intake/Output from previous day: 03/10 0701 - 03/11 0700 In: 2219.8 [P.O.:720; I.V.:1443.8; IV Piggyback:50] Out: 357.5 [Urine:350; Drains:7.5] Intake/Output this shift: Total I/O In: 240 [P.O.:240] Out: -   Resp: clear to auscultation bilaterally Cardio: regular rate and rhythm GI: soft, mild tenderness. good bs. incisions look good. drain output serous  Lab Results:   Recent Labs  12/16/16 0405  WBC 17.2*  HGB 11.3*  HCT 32.4*  PLT 149*   BMET  Recent Labs  12/16/16 0405  NA 135  K 3.2*  CL 103  CO2 26  GLUCOSE 103*  BUN 19  CREATININE 0.74  CALCIUM 7.6*   PT/INR No results for input(s): LABPROT, INR in the last 72 hours. ABG No results for input(s): PHART, HCO3 in the last 72 hours.  Invalid input(s): PCO2, PO2  Studies/Results: No results found.  Anti-infectives: Anti-infectives    Start     Dose/Rate Route Frequency Ordered Stop   12/13/16 2000  piperacillin-tazobactam (ZOSYN) IVPB 3.375 g     3.375 g 12.5 mL/hr over 240 Minutes Intravenous Every 8 hours 12/13/16 1816     12/13/16 0930  cefTRIAXone (ROCEPHIN) 2 g in dextrose 5 % 50 mL IVPB     2 g 100 mL/hr over 30 Minutes Intravenous On call to O.R. 12/13/16 0848 12/13/16 1517      Assessment/Plan: s/p Procedure(s): LAPAROSCOPIC CHOLECYSTECTOMY WITH INTRAOPERATIVE CHOLANGIOGRAM (N/A) Advance diet  Wbc may be left over from pancreatitis. Will recheck tomorrow Ambulate Continue zosyn until wbc normal  LOS: 5 days    TOTH  III,[name] S 12/16/2016

## 2016-12-16 NOTE — Progress Notes (Signed)
Patient slow to respond this am, whispers when answers questions. Oriented to person,disoriented to place. Vital signs obtained within normal limits. Refused breakfast, fluids given. Dr. Carolynne Edouardoth made aware. Wife at bedside.

## 2016-12-16 NOTE — Progress Notes (Signed)
Patient alert and awake at this time. Oriented to time , place and situation. Appears fatigued.Will continue to monitor.

## 2016-12-16 NOTE — Plan of Care (Signed)
Problem: Nutrition: Goal: Adequate nutrition will be maintained Outcome: Progressing Tolerating diet advanced to regular.

## 2016-12-17 ENCOUNTER — Inpatient Hospital Stay (HOSPITAL_COMMUNITY): Payer: BC Managed Care – PPO

## 2016-12-17 ENCOUNTER — Encounter (HOSPITAL_COMMUNITY): Payer: Self-pay | Admitting: Radiology

## 2016-12-17 DIAGNOSIS — E8770 Fluid overload, unspecified: Secondary | ICD-10-CM

## 2016-12-17 LAB — CBC WITH DIFFERENTIAL/PLATELET
BASOS PCT: 0 %
Basophils Absolute: 0 10*3/uL (ref 0.0–0.1)
EOS ABS: 0 10*3/uL (ref 0.0–0.7)
Eosinophils Relative: 0 %
HCT: 32.8 % — ABNORMAL LOW (ref 39.0–52.0)
Hemoglobin: 11.3 g/dL — ABNORMAL LOW (ref 13.0–17.0)
Lymphocytes Relative: 2 %
Lymphs Abs: 0.5 10*3/uL — ABNORMAL LOW (ref 0.7–4.0)
MCH: 31.7 pg (ref 26.0–34.0)
MCHC: 34.5 g/dL (ref 30.0–36.0)
MCV: 91.9 fL (ref 78.0–100.0)
MONO ABS: 1.5 10*3/uL — AB (ref 0.1–1.0)
Monocytes Relative: 6 %
NEUTROS PCT: 92 %
Neutro Abs: 23.5 10*3/uL — ABNORMAL HIGH (ref 1.7–7.7)
PLATELETS: 174 10*3/uL (ref 150–400)
RBC: 3.57 MIL/uL — AB (ref 4.22–5.81)
RDW: 13.4 % (ref 11.5–15.5)
WBC: 25.5 10*3/uL — AB (ref 4.0–10.5)

## 2016-12-17 LAB — CULTURE, BLOOD (ROUTINE X 2)
CULTURE: NO GROWTH
CULTURE: NO GROWTH

## 2016-12-17 LAB — PROCALCITONIN: Procalcitonin: 1.24 ng/mL

## 2016-12-17 MED ORDER — IOPAMIDOL (ISOVUE-300) INJECTION 61%
INTRAVENOUS | Status: AC
  Administered 2016-12-17: 15 mL
  Filled 2016-12-17: qty 30

## 2016-12-17 MED ORDER — IOPAMIDOL (ISOVUE-300) INJECTION 61%: 15.0000 mL | Freq: Once | INTRAVENOUS | Status: AC | PRN

## 2016-12-17 MED ORDER — IOPAMIDOL (ISOVUE-300) INJECTION 61%
100.0000 mL | Freq: Once | INTRAVENOUS | Status: AC | PRN
  Administered 2016-12-17: 100 mL via INTRAVENOUS

## 2016-12-17 MED ORDER — IOPAMIDOL (ISOVUE-300) INJECTION 61%
INTRAVENOUS | Status: AC
  Filled 2016-12-17: qty 100

## 2016-12-17 MED ORDER — KCL IN DEXTROSE-NACL 40-5-0.9 MEQ/L-%-% IV SOLN
INTRAVENOUS | Status: DC
  Administered 2016-12-17 – 2016-12-19 (×4): via INTRAVENOUS
  Administered 2016-12-19 – 2016-12-21 (×3): 1000 mL via INTRAVENOUS
  Administered 2016-12-21 – 2016-12-22 (×3): via INTRAVENOUS
  Filled 2016-12-17 (×13): qty 1000

## 2016-12-17 MED ORDER — FUROSEMIDE 10 MG/ML IJ SOLN
20.0000 mg | Freq: Once | INTRAMUSCULAR | Status: AC
  Administered 2016-12-17: 20 mg via INTRAVENOUS
  Filled 2016-12-17: qty 2

## 2016-12-17 NOTE — Progress Notes (Signed)
  General Surgery [facility]  Assessment & Plan:  POD#4 - status post lap chole with IOC, drain placement, for biliary pancreatitis  WBC rising - 25K this AM, afebrile  Tolerating diet, denies pain  Will obtain CT abd/pelvis today to evaluate rising WBC - ? Abscess, pancreatitis        Velora Hecklerodd M. Madden Garron, MD, [facility]       [facility]       Office: [phone]    Subjective: Patient in bed, whispering, no complaints.  Taking all of diet.  Objective: Vital signs in last 24 hours: Temp:  [97.5 F (36.4 C)-98.2 F (36.8 C)] 97.7 F (36.5 C) (03/12 0515) Pulse Rate:  [59-66] 66 (03/12 0515) Resp:  [17] 17 (03/12 0515) BP: (118-137)/(68-75) 134/75 (03/12 0515) SpO2:  [92 %-100 %] 97 % (03/12 0745) Last BM Date: 12/15/16  Intake/Output from previous day: 03/11 0701 - 03/12 0700 In: 290 [P.O.:240; IV Piggyback:50] Out: 3.5 [Drains:2.5; Stool:1] Intake/Output this shift: No intake/output data recorded.  Physical Exam: HEENT - sclerae clear, mucous membranes moist Neck - soft Chest - clear bilaterally Cor - RRR Abdomen - soft, mild distension; BS present; wounds dry and intact; JP drain with thin serous output Ext - no edema, non-tender Neuro - alert & oriented, no focal deficits  Lab Results:   Recent Labs  12/16/16 0405 12/17/16 0541  WBC 17.2* 25.5*  HGB 11.3* 11.3*  HCT 32.4* 32.8*  PLT 149* 174   BMET  Recent Labs  12/16/16 0405  NA 135  K 3.2*  CL 103  CO2 26  GLUCOSE 103*  BUN 19  CREATININE 0.74  CALCIUM 7.6*   PT/INR No results for input(s): LABPROT, INR in the last 72 hours. Comprehensive Metabolic Panel:    Component Value Date/Time   NA 135 12/16/2016 0405   NA 135 12/12/2016 1122   K 3.2 (L) 12/16/2016 0405   K 4.3 12/12/2016 1122   CL 103 12/16/2016 0405   CL 105 12/12/2016 1122   CO2 26 12/16/2016 0405   CO2 23 12/12/2016 1122   BUN 19 12/16/2016 0405   BUN 29 (H) 12/12/2016 1122   CREATININE 0.74 12/16/2016 0405   CREATININE 0.91 12/12/2016 1122   GLUCOSE 103 (H) 12/16/2016 0405   GLUCOSE 132 (H) 12/12/2016 1122   CALCIUM 7.6 (L) 12/16/2016 0405   CALCIUM 8.4 (L) 12/12/2016 1122   AST 28 12/16/2016 0405   AST 20 12/12/2016 1122   ALT 26 12/16/2016 0405   ALT 18 12/12/2016 1122   ALKPHOS 60 12/16/2016 0405   ALKPHOS 31 (L) 12/12/2016 1122   BILITOT 1.8 (H) 12/16/2016 0405   BILITOT 2.0 (H) 12/12/2016 1122   PROT 4.6 (L) 12/16/2016 0405   PROT 6.2 (L) 12/12/2016 1122   ALBUMIN 2.0 (L) 12/16/2016 0405   ALBUMIN 3.0 (L) 12/12/2016 1122    Studies/Results: No results found.    Kendle Erker M 12/17/2016  Patient ID: [name], male   DOB: 29-Mar-1956, 61 y.o.   MRN: [id number]

## 2016-12-17 NOTE — Progress Notes (Signed)
Dressing change to JP drain,sutures intact. Area around JP drain reddened with  Moderate amount of yellowish/brown drainage noted on old dressing.Area cleansed around drain and DSD reapplied. Wife called twice to get report about CT scan unable to give results due to being with other patients when she called.Pt  NPO. Sat up in chair x2.

## 2016-12-17 NOTE — Progress Notes (Signed)
CT scan reviewed, WBC up to 25.5 this AM.  CT scan:1. Worsening pancreatitis. No evidence of necrosis or pseudocyst formation. No definite abscess. 2. Scattered ascites. 3. Small bilateral pleural effusions with collapse/consolidation in both lower lobes, cannot exclude pneumonia.  Will make him NPO for pancreatitis and ask Medicine to come back and help with pneumonia.  Add K+ to IV fluids, and make him NPO.

## 2016-12-17 NOTE — Consult Note (Addendum)
Medical Consultation   [name]  NWG:[id number]  DOB: Jan 09, 1956  DOA: 12/11/2016  PCP: [name] HoffSWAYNE,[name], MD Outpatient Specialists:    Requesting physician: Dr. Darnell Levelodd Gerkin, CCS  Reason for consultation: Worsening leukocytosis and concern for worsening pancreatitis and possible new onset pneumonia-evaluation and management.  History of Present Illness: [name] is an 61 y.o. male with PMH of asthma, anxiety, physically active (ran 4 times per week and lifted weights), was referred by PCP due to nausea, vomiting and abdominal pain. He was diagnosed with acute biliary pancreatitis. General surgery was consulted and he underwent laparoscopic cholecystectomy with intraoperative cholangiogram and placement of drain on 12/13/16. Diet was gradually advanced which she has tolerated. Having BMs. Due to worsening leukocytosis, primary service obtained CT abdomen and pelvis which shows worsening pancreatitis without evidence of necrosis or pseudocyst formation and no definitive abscess, scattered ascites, small bilateral pleural effusions with collapse/consolidation in both lower lobes, cannot exclude pneumonia per report. Patient was made NPO and TRH was requested to reassess.  Patient states that he has been progressively improving since surgery. Complains of soreness at drain site but otherwise no significant abdominal pain. Occasional nausea but tolerating diet. Having multiple BMs-some normal and some watery. Intermittent dyspnea but denies cough or chest pain. Denies dysuria or urinary frequency. Having difficulty sleeping at night.    Review of Systems:  ROS All other systems reviewed and apart from HPI, all others are negative.  Past Medical History: Past Medical History:  Diagnosis Date  . Anxiety   . Asthma    uses inhaler    Past Surgical History: Past Surgical History:  Procedure Laterality Date  . CHOLECYSTECTOMY N/A 12/13/2016   Procedure: LAPAROSCOPIC  CHOLECYSTECTOMY WITH INTRAOPERATIVE CHOLANGIOGRAM;  Surgeon: Chevis PrettyPaul [name], MD;  Location: [facility];  Service: General;  Laterality: N/A;  . GUM SURGERY       Allergies:  No Known Allergies   Social History:  reports that he has never smoked. He has never used smokeless tobacco. He reports that he drinks about 0.6 oz of alcohol per week . He reports that he does not use drugs.   Family History: Family History  Problem Relation Age of Onset  . Dementia Mother       Physical Exam: Vitals:   12/16/16 2141 12/17/16 0515 12/17/16 0745 12/17/16 1415  BP: 137/68 134/75  137/73  Pulse: 64 66  63  Resp: 17 17  18   Temp: 98.2 F (36.8 C) 97.7 F (36.5 C)  97.9 F (36.6 C)  TempSrc: Oral Oral  Oral  SpO2: 97% 96% 97% 95%  Weight:      Height:        Constitutional: Pleasant middle-aged male, lying comfortably supine in bed without distress. Does not appear septic or toxic. Eyes: PERLA, EOMI, irises appear normal, anicteric sclera,  ENMT: external ears and nose appear normal, hearing normal, Lips appears normal, oropharynx mucosa, tongue, posterior pharynx appear normal . Mostly edentulous.  Neck: neck appears normal, no masses, normal ROM, no thyromegaly, no JVD  CVS: S1-S2 clear, RRR, no murmur rubs or gallops, normal pedal pulses . 1+ pitting bilateral leg edema. Respiratory:  Reduced breath sounds in the bases with bibasal bronchial breath sounds but no wheezing, rhonchi or crackles. Rest of lung fields clear to auscultation. Respiratory effort normal. No accessory muscle use.  Abdomen: Nondistended, laparotomy incision site intact and healing without acute  findings. No tenderness, rigidity or guarding. RUQ biliary drain with minimal serosanguineous fluid.  Musculoskeletal: : no cyanosis, clubbing or edema noted bilaterally. Joint/bones/muscle exam, strength, contractures or atrophy Neuro: Cranial nerves II-XII intact, strength, sensation, reflexes Psych: judgement and insight  appear normal, stable mood and affect, mental status Skin: no rashes or lesions or ulcers, no induration or nodules    Data reviewed:  I have personally reviewed following labs and imaging studies Labs:  CBC:  Recent Labs Lab 12/11/16 1326 12/11/16 1942 12/12/16 0428 12/13/16 0414 12/16/16 0405 12/17/16 0541  WBC 22.5* 15.0* 13.9* 9.6 17.2* 25.5*  NEUTROABS 20.9*  --   --   --  15.1* 23.5*  HGB 17.5* 15.0 14.8 13.6 11.3* 11.3*  HCT 48.6 42.5 42.7 39.3 32.4* 32.8*  MCV 93.3 94.2 93.4 95.4 93.9 91.9  PLT 187 150 156 164 149* 174    Basic Metabolic Panel:  Recent Labs Lab 12/11/16 1326 12/11/16 1942 12/12/16 0428 12/12/16 1122 12/16/16 0405  NA 132*  --  138 135 135  K 4.3  --  4.9 4.3 3.2*  CL 100*  --  109 105 103  CO2 22  --  24 23 26   GLUCOSE 133*  --  104* 132* 103*  BUN 35*  --  30* 29* 19  CREATININE 1.39* 1.14 0.87 0.91 0.74  CALCIUM 8.8*  --  8.4* 8.4* 7.6*   Liver Function Tests:  Recent Labs Lab 12/11/16 1326 12/12/16 0428 12/12/16 1122 12/16/16 0405  AST 28 20 20 28   ALT 24 18 18 26   ALKPHOS 29* 26* 31* 60  BILITOT 2.5* 2.1* 2.0* 1.8*  PROT 6.4* 5.7* 6.2* 4.6*  ALBUMIN 3.7 2.7* 3.0* 2.0*    Recent Labs Lab 12/11/16 1326 12/12/16 0428 12/13/16 0414 12/16/16 0405  LIPASE 376* 93* 27 22    Cardiac Enzymes:  Recent Labs Lab 12/11/16 1326  CKTOTAL 44*  TROPONINI <0.03      Sepsis Labs Invalid input(s): PROCALCITONIN,  WBC,  LACTICIDVEN Microbiology Recent Results (from the past 240 hour(s))  Culture, blood (Routine X 2) [name] Reflex to ID Panel     Status: None   Collection Time: 12/11/16  7:42 PM  Result Value Ref Range Status   Specimen Description BLOOD LEFT ANTECUBITAL  Final   Special Requests BOTTLES DRAWN AEROBIC AND ANAEROBIC 5CC  Final   Culture   Final    NO GROWTH 5 DAYS Performed at [facility], [address], [place], [place] [address]    Report Status 12/17/2016 FINAL  Final  Culture, blood (Routine X 2)  [name] Reflex to ID Panel     Status: None   Collection Time: 12/11/16  7:42 PM  Result Value Ref Range Status   Specimen Description BLOOD LEFT ANTECUBITAL  Final   Special Requests BOTTLES DRAWN AEROBIC AND ANAEROBIC 5CC  Final   Culture   Final    NO GROWTH 5 DAYS Performed at [facility], [address], [place], [place] [address]    Report Status 12/17/2016 FINAL  Final  Surgical PCR screen     Status: Abnormal   Collection Time: 12/13/16 10:55 AM  Result Value Ref Range Status   MRSA, PCR NEGATIVE NEGATIVE Final   Staphylococcus aureus POSITIVE (A) NEGATIVE Final    Comment:        The Xpert SA Assay (FDA approved for NASAL specimens in patients over 46 years of age), is one component of a comprehensive surveillance program.  Test performance has been validated by [facility] for patients greater than or equal to 40 year old. It is not intended to diagnose infection nor to guide or monitor treatment.        Inpatient Medications:   Scheduled Meds: . Chlorhexidine Gluconate Cloth  6 each Topical Daily  . clonazePAM  1.5 mg Oral QHS  . enoxaparin (LOVENOX) injection  40 mg Subcutaneous Q24H  . iopamidol      . mometasone-formoterol  2 puff Inhalation BID  . montelukast  10 mg Oral QHS  . mupirocin ointment  1 application Nasal BID  . piperacillin-tazobactam (ZOSYN)  IV  3.375 g Intravenous Q8H  . sertraline  200 mg Oral QHS   Continuous Infusions: . dextrose 5 % and 0.9 % NaCl with KCl 40 mEq/L 125 mL/hr at 12/17/16 1608     Radiological Exams on Admission: Ct Abdomen Pelvis [name] Contrast  Result Date: 12/17/2016 CLINICAL DATA:  Laparoscopic cholecystectomy 4 days ago with drain placement, biliary pancreatitis, rising white blood cell count. EXAM: CT ABDOMEN AND PELVIS WITH CONTRAST TECHNIQUE: Multidetector CT imaging of the abdomen and pelvis was performed using the standard protocol following bolus administration of intravenous contrast. CONTRAST:   ISOVUE-300 IOPAMIDOL (ISOVUE-300) INJECTION 61%, 15mL ISOVUE-300 IOPAMIDOL (ISOVUE-300) INJECTION 61% COMPARISON:  12/11/2016. FINDINGS: Lower chest: Small bilateral pleural effusions with collapse/ consolidation in both lower lobes, left greater than right. Heart is enlarged. No pericardial effusion. Hepatobiliary: The liver is unremarkable. Cholecystectomy. No biliary ductal dilatation. Pancreas: Extensive inflammatory stranding and fluid surrounds the pancreas, increased from 12/11/2016. Pancreas appears uniform in attenuation. Spleen: 2.9 cm low-attenuation lesion in the medial spleen is unchanged. Adrenals/Urinary Tract: Adrenal glands and kidneys are unremarkable. Ureters are decompressed. Bladder is grossly unremarkable. Stomach/Bowel: Stomach, small bowel, appendix and colon are unremarkable. Vascular/Lymphatic: Minimal atherosclerotic calcification of the arterial vasculature. Peripancreatic lymph node measures 14 mm. Reproductive: Prostate is visualized. Other: Mild presacral edema. Bilateral inguinal hernias contain fat. Scattered ascites. Extensive peripancreatic fluid and stranding. Percutaneous perihepatic drain in place. Musculoskeletal: No worrisome lytic or sclerotic lesions. Subtotal fusion of the sacroiliac joints. Degenerative changes are seen in the spine. IMPRESSION: 1. Worsening pancreatitis. No evidence of necrosis or pseudocyst formation. No definite abscess. 2. Scattered ascites. 3. Small bilateral pleural effusions with collapse/consolidation in both lower lobes, cannot exclude pneumonia. Electronically Signed   By: Leanna Battles M.D.   On: 12/17/2016 12:47    Impression/Recommendations Principal Problem:   Acute gallstone pancreatitis Active Problems:   Hyponatremia   AKI (acute kidney injury) (HCC)   1. Acute biliary pancreatitis: Status post laparoscopic cholecystectomy, IOC & drain placement on 12/13/16. Clinically seems to have done quite well: Not much pain, tolerating  diet, having BMs, no fevers, lipase normalized and improved LFTs. Has been on IV Zosyn since 12/13/16. Unclear significance off repeat CT abdomen findings from 3/12 showing worsening pancreatitis but no evidence of necrosis, pseudocyst or abscess. No new recommendations. Consider GI consultation. 2. Leukocytosis: Overall patient doing well. No clear respiratory symptoms suggestive of pneumonia. Clinical and radiological findings likely related to atelectasis/pleural effusion. Already on Zosyn. Check pro calcitonin. Continue to trend daily CBCs and if leukocytosis keeps worsening then consider further evaluation. Having some watery stools but currently low index of suspicion for C. difficile. 3. Hypokalemia: Replace and follow. 4. Anemia: Secondary to acute illness. Stable. 5. Acute kidney injury: Resolved. 6. Volume overload: Secondary to fluid resuscitation and hypoalbuminemia. Incentive spirometry. Trial of a dose of Lasix 20 mg IV  1. Reduce IV fluids.  7. Anxiety: Stable. 8. Asthma: Stable without clinical bronchospasm.  Thank you for this consultation.  We will follow the patient along with you.   Time Spent: 60 minutes  Rashelle Ireland M.D. [facility] 12/17/2016, 5:13 PM

## 2016-12-18 DIAGNOSIS — D72825 Bandemia: Secondary | ICD-10-CM

## 2016-12-18 DIAGNOSIS — D72829 Elevated white blood cell count, unspecified: Secondary | ICD-10-CM

## 2016-12-18 DIAGNOSIS — K8000 Calculus of gallbladder with acute cholecystitis without obstruction: Secondary | ICD-10-CM

## 2016-12-18 DIAGNOSIS — E876 Hypokalemia: Secondary | ICD-10-CM

## 2016-12-18 DIAGNOSIS — B370 Candidal stomatitis: Secondary | ICD-10-CM

## 2016-12-18 LAB — CBC
HEMATOCRIT: 34.5 % — AB (ref 39.0–52.0)
Hemoglobin: 12.2 g/dL — ABNORMAL LOW (ref 13.0–17.0)
MCH: 32.7 pg (ref 26.0–34.0)
MCHC: 35.4 g/dL (ref 30.0–36.0)
MCV: 92.5 fL (ref 78.0–100.0)
Platelets: 197 10*3/uL (ref 150–400)
RBC: 3.73 MIL/uL — AB (ref 4.22–5.81)
RDW: 13.4 % (ref 11.5–15.5)
WBC: 27.5 10*3/uL — AB (ref 4.0–10.5)

## 2016-12-18 LAB — COMPREHENSIVE METABOLIC PANEL
ALBUMIN: 1.9 g/dL — AB (ref 3.5–5.0)
ALT: 24 U/L (ref 17–63)
AST: 26 U/L (ref 15–41)
Alkaline Phosphatase: 67 U/L (ref 38–126)
Anion gap: 7 (ref 5–15)
BUN: 14 mg/dL (ref 6–20)
CHLORIDE: 101 mmol/L (ref 101–111)
CO2: 27 mmol/L (ref 22–32)
CREATININE: 0.69 mg/dL (ref 0.61–1.24)
Calcium: 7.6 mg/dL — ABNORMAL LOW (ref 8.9–10.3)
GFR calc Af Amer: >60 mL/min (ref >60)
Glucose, Bld: 118 mg/dL — ABNORMAL HIGH (ref 65–99)
POTASSIUM: 2.9 mmol/L — AB (ref 3.5–5.1)
Sodium: 135 mmol/L (ref 135–145)
TOTAL PROTEIN: 4.8 g/dL — AB (ref 6.5–8.1)
Total Bilirubin: 2 mg/dL — ABNORMAL HIGH (ref 0.3–1.2)

## 2016-12-18 LAB — MAGNESIUM: Magnesium: 1.9 mg/dL (ref 1.7–2.4)

## 2016-12-18 LAB — LIPASE, BLOOD: LIPASE: 36 U/L (ref 11–51)

## 2016-12-18 MED ORDER — SODIUM CHLORIDE 0.9 % IV SOLN: 30.0000 meq | Freq: Once | INTRAVENOUS | Status: DC

## 2016-12-18 MED ORDER — POTASSIUM CHLORIDE 10 MEQ/100ML IV SOLN
10.0000 meq | INTRAVENOUS | Status: AC
  Administered 2016-12-18 (×2): 10 meq via INTRAVENOUS
  Filled 2016-12-18 (×3): qty 100

## 2016-12-18 MED ORDER — BISACODYL 10 MG RE SUPP
10.0000 mg | Freq: Once | RECTAL | Status: AC
  Administered 2016-12-18: 10 mg via RECTAL
  Filled 2016-12-18: qty 1

## 2016-12-18 MED ORDER — NYSTATIN 100000 UNIT/ML MT SUSP
5.0000 mL | Freq: Four times a day (QID) | OROMUCOSAL | Status: DC
  Administered 2016-12-18 – 2016-12-23 (×20): 500000 [IU] via ORAL
  Filled 2016-12-18 (×20): qty 5

## 2016-12-18 MED ORDER — IPRATROPIUM BROMIDE 0.02 % IN SOLN
RESPIRATORY_TRACT | Status: AC
  Filled 2016-12-18: qty 2.5

## 2016-12-18 MED ORDER — POTASSIUM CHLORIDE 10 MEQ/100ML IV SOLN
10.0000 meq | INTRAVENOUS | Status: AC
  Administered 2016-12-18 – 2016-12-19 (×4): 10 meq via INTRAVENOUS
  Filled 2016-12-18 (×5): qty 100

## 2016-12-18 MED ORDER — FLUCONAZOLE IN SODIUM CHLORIDE 100-0.9 MG/50ML-% IV SOLN
100.0000 mg | INTRAVENOUS | Status: DC
  Administered 2016-12-18 – 2016-12-22 (×5): 100 mg via INTRAVENOUS
  Filled 2016-12-18 (×7): qty 50

## 2016-12-18 MED ORDER — POTASSIUM CHLORIDE 10 MEQ/100ML IV SOLN
10.0000 meq | INTRAVENOUS | Status: DC
  Filled 2016-12-18 (×3): qty 100

## 2016-12-18 NOTE — Progress Notes (Signed)
CONSULT NOTE    [name]   [name]  DOB: [birth year]  DOA: 12/11/2016 PCP: [name] Hoff, MD   Brief Narrative:  [name] a 61 y.o.malewith no significant past medical history that comes in for abdominal pain that started 2 days prior to admission. He relates he was nauseated and started vomiting with significant abdominal pain no hematemesis. He took several naproxen to relieve the pain and was unsuccessful. He has not been able to tolerate anything by mouth since the abdominal pain started. He denies any diarrhea, fever, constipation. He relates he cannot take a deep breath as his abdominal pain gets worse. He relates that his urine has been very dark and brown colored today he was seen by his PCP today who sent him to the ED. Found to have acute pancreatitis with phlegmon and gallstones on CT. Underwent  laparoscopic cholecystectomy with intraoperative cholangiogram and placement of drain on 12/13/16. Diet was gradually advanced. WBC worsened however and a CT abd/pelvis was done which showed worsening pancreatitis without evidence of necrosis or pseudocyst formation and no definitive abscess, scattered ascites, small bilateral pleural effusions with collapse/consolidation in both lower lobes, cannot exclude pneumonia per report. Patient was made NPO and TRH was requested to reassess. He did not show signs of pneumonia on exam. He was given 20 mg IV lasix and IVF were cut back but he only voided a small amount last night and has not voided since.   Subjective: He states his belly hurts- all of it feels sore. Does not radiate to back. No cough or dyspnea. Has been using IS.   Noted to be whispering. States he can't talk louder than this since the surgery.    Assessment & Plan: Poor urine output overnight, abdomen is sore, exam reveal thrush on tongue and palate and a moderately distended abdomen which is tympanic to percussion, drain has a tiny amount of thin yellowish fluid,  voice is just a whisper.   Leukocytosis- gallstone pancreatitis-  Thrush - Steady increase in WBC 9.6 >>> 27.5 despite being on Zosyn - ? leukocytosis due to possible candida esophagitis- start Nystatin and Diflucan to see if leukocytosis improves - follow pancreatitis- oddly lipase is normal - he is NPO- will increase IVF from 50 up to 100 cc/hr - agree that he has no pneumonia symptoms, need to use IS and at least sit up in chair most of the day if he doesn't walk down the hall  Abdominal distension - no BM in a couple of days- give Dulcolax suppository   Hypokalemia - K 2.9 today- replace aggressively as he is NPO & had Lasix last night  - 60 meq IV runs, 40 meq in continuous fluids,  - Mg normal  DVT prophylaxis: Lovenox Code Status: Full code Family Communication: wife at bedside Disposition Plan: per gen surgery    Antimicrobials:  Anti-infectives    Start     Dose/Rate Route Frequency Ordered Stop   12/18/16 1215  fluconazole (DIFLUCAN) IVPB 100 mg     100 mg 50 mL/hr over 60 Minutes Intravenous Every 24 hours 12/18/16 1201     12/13/16 2000  piperacillin-tazobactam (ZOSYN) IVPB 3.375 g     3.375 g 12.5 mL/hr over 240 Minutes Intravenous Every 8 hours 12/13/16 1816     12/13/16 0930  cefTRIAXone (ROCEPHIN) 2 g in dextrose 5 % 50 mL IVPB     2 g 100 mL/hr over 30 Minutes Intravenous On call to O.R. 12/13/16 0848 12/13/16  1517       Objective: Vitals:   12/17/16 1415 12/17/16 2050 12/17/16 2056 12/18/16 0536  BP: 137/73  138/75 131/70  Pulse: 63 64 66 62  Resp: 18 18 18 16   Temp: 97.9 F (36.6 C)  97.4 F (36.3 C) 98.4 F (36.9 C)  TempSrc: Oral  Oral Oral  SpO2: 95% 96% 98% 93%  Weight:    98.9 kg (218 lb 0.6 oz)  Height:        Intake/Output Summary (Last 24 hours) at 12/18/16 1201 Last data filed at 12/18/16 0634  Gross per 24 hour  Intake          1279.17 ml  Output             1825 ml  Net          -545.83 ml   Filed Weights   12/14/16 0538  12/16/16 0537 12/18/16 0536  Weight: 95.2 kg (209 lb 14.1 oz) 98.7 kg (217 lb 11.2 oz) 98.9 kg (218 lb 0.6 oz)    Examination: General exam: Appears comfortable  HEENT: PERRLA, oral mucosa moist, no sclera icterus +  thrush Respiratory system: Clear to auscultation. Respiratory effort normal. Cardiovascular system: S1 & S2 heard, RRR.  No murmurs  Gastrointestinal system: Abdomen soft,  tender,  distended. Normal bowel sound. No organomegaly Central nervous system: Alert and oriented. No focal neurological deficits. Extremities: No cyanosis, clubbing or edema Skin: No rashes or ulcers Psychiatry:  Mood & affect appropriate.     Data Reviewed: I have personally reviewed following labs and imaging studies  CBC:  Recent Labs Lab 12/11/16 1326  12/12/16 0428 12/13/16 0414 12/16/16 0405 12/17/16 0541 12/18/16 0443  WBC 22.5*  < > 13.9* 9.6 17.2* 25.5* 27.5*  NEUTROABS 20.9*  --   --   --  15.1* 23.5*  --   HGB 17.5*  < > 14.8 13.6 11.3* 11.3* 12.2*  HCT 48.6  < > 42.7 39.3 32.4* 32.8* 34.5*  MCV 93.3  < > 93.4 95.4 93.9 91.9 92.5  PLT 187  < > 156 164 149* 174 197  < > = values in this interval not displayed. Basic Metabolic Panel:  Recent Labs Lab 12/11/16 1326 12/11/16 1942 12/12/16 0428 12/12/16 1122 12/16/16 0405 12/18/16 0443  NA 132*  --  138 135 135 135  K 4.3  --  4.9 4.3 3.2* 2.9*  CL 100*  --  109 105 103 101  CO2 22  --  24 23 26 27   GLUCOSE 133*  --  104* 132* 103* 118*  BUN 35*  --  30* 29* 19 14  CREATININE 1.39* 1.14 0.87 0.91 0.74 0.69  CALCIUM 8.8*  --  8.4* 8.4* 7.6* 7.6*  MG  --   --   --   --   --  1.9   GFR: Estimated Creatinine Clearance: 120.4 mL/min (by C-G formula based on SCr of 0.69 mg/dL). Liver Function Tests:  Recent Labs Lab 12/11/16 1326 12/12/16 0428 12/12/16 1122 12/16/16 0405 12/18/16 0443  AST 28 20 20 28 26   ALT 24 18 18 26 24   ALKPHOS 29* 26* 31* 60 67  BILITOT 2.5* 2.1* 2.0* 1.8* 2.0*  PROT 6.4* 5.7* 6.2* 4.6*  4.8*  ALBUMIN 3.7 2.7* 3.0* 2.0* 1.9*    Recent Labs Lab 12/11/16 1326 12/12/16 0428 12/13/16 0414 12/16/16 0405 12/18/16 0443  LIPASE 376* 93* 27 22 36   No results for input(s): AMMONIA in the last 168 hours.  Coagulation Profile: No results for input(s): INR, PROTIME in the last 168 hours. Cardiac Enzymes:  Recent Labs Lab 12/11/16 1326  CKTOTAL 44*  TROPONINI <0.03   BNP (last 3 results) No results for input(s): PROBNP in the last 8760 hours. HbA1C: No results for input(s): HGBA1C in the last 72 hours. CBG: No results for input(s): GLUCAP in the last 168 hours. Lipid Profile: No results for input(s): CHOL, HDL, LDLCALC, TRIG, CHOLHDL, LDLDIRECT in the last 72 hours. Thyroid Function Tests: No results for input(s): TSH, T4TOTAL, FREET4, T3FREE, THYROIDAB in the last 72 hours. Anemia Panel: No results for input(s): VITAMINB12, FOLATE, FERRITIN, TIBC, IRON, RETICCTPCT in the last 72 hours. Urine analysis:    Component Value Date/Time   COLORURINE AMBER (A) 12/11/2016 1208   APPEARANCEUR HAZY (A) 12/11/2016 1208   LABSPEC 1.033 (H) 12/11/2016 1208   PHURINE 5.0 12/11/2016 1208   GLUCOSEU NEGATIVE 12/11/2016 1208   HGBUR NEGATIVE 12/11/2016 1208   BILIRUBINUR SMALL (A) 12/11/2016 1208   KETONESUR NEGATIVE 12/11/2016 1208   PROTEINUR 100 (A) 12/11/2016 1208   NITRITE NEGATIVE 12/11/2016 1208   LEUKOCYTESUR NEGATIVE 12/11/2016 1208   Sepsis Labs: @LABRCNTIP (procalcitonin:4,lacticidven:4) ) Recent Results (from the past 240 hour(s))  Culture, blood (Routine X 2) w Reflex to ID Panel     Status: None   Collection Time: 12/11/16  7:42 PM  Result Value Ref Range Status   Specimen Description BLOOD LEFT ANTECUBITAL  Final   Special Requests BOTTLES DRAWN AEROBIC AND ANAEROBIC 5CC  Final   Culture   Final    NO GROWTH 5 DAYS Performed at [facility], [address] [place], [place] [address]    Report Status 12/17/2016 FINAL  Final  Culture, blood  (Routine X 2) w Reflex to ID Panel     Status: None   Collection Time: 12/11/16  7:42 PM  Result Value Ref Range Status   Specimen Description BLOOD LEFT ANTECUBITAL  Final   Special Requests BOTTLES DRAWN AEROBIC AND ANAEROBIC 5CC  Final   Culture   Final    NO GROWTH 5 DAYS Performed at [facility], [address], [place], [place] [address]    Report Status 12/17/2016 FINAL  Final  Surgical PCR screen     Status: Abnormal   Collection Time: 12/13/16 10:55 AM  Result Value Ref Range Status   MRSA, PCR NEGATIVE NEGATIVE Final   Staphylococcus aureus POSITIVE (A) NEGATIVE Final    Comment:        The Xpert SA Assay (FDA approved for NASAL specimens in patients over 69 years of age), is one component of a comprehensive surveillance program.  Test performance has been validated by [facility] for patients greater than or equal to 82 year old. It is not intended to diagnose infection nor to guide or monitor treatment.          Radiology Studies: Ct Abdomen Pelvis W Contrast  Result Date: 12/17/2016 CLINICAL DATA:  Laparoscopic cholecystectomy 4 days ago with drain placement, biliary pancreatitis, rising white blood cell count. EXAM: CT ABDOMEN AND PELVIS WITH CONTRAST TECHNIQUE: Multidetector CT imaging of the abdomen and pelvis was performed using the standard protocol following bolus administration of intravenous contrast. CONTRAST:  ISOVUE-300 IOPAMIDOL (ISOVUE-300) INJECTION 61%, 15mL ISOVUE-300 IOPAMIDOL (ISOVUE-300) INJECTION 61% COMPARISON:  12/11/2016. FINDINGS: Lower chest: Small bilateral pleural effusions with collapse/ consolidation in both lower lobes, left greater than right. Heart is enlarged. No pericardial effusion. Hepatobiliary: The liver is unremarkable.  Cholecystectomy. No biliary ductal dilatation. Pancreas: Extensive inflammatory stranding and fluid surrounds the pancreas, increased from 12/11/2016. Pancreas appears uniform in attenuation. Spleen:  2.9 cm low-attenuation lesion in the medial spleen is unchanged. Adrenals/Urinary Tract: Adrenal glands and kidneys are unremarkable. Ureters are decompressed. Bladder is grossly unremarkable. Stomach/Bowel: Stomach, small bowel, appendix and colon are unremarkable. Vascular/Lymphatic: Minimal atherosclerotic calcification of the arterial vasculature. Peripancreatic lymph node measures 14 mm. Reproductive: Prostate is visualized. Other: Mild presacral edema. Bilateral inguinal hernias contain fat. Scattered ascites. Extensive peripancreatic fluid and stranding. Percutaneous perihepatic drain in place. Musculoskeletal: No worrisome lytic or sclerotic lesions. Subtotal fusion of the sacroiliac joints. Degenerative changes are seen in the spine. IMPRESSION: 1. Worsening pancreatitis. No evidence of necrosis or pseudocyst formation. No definite abscess. 2. Scattered ascites. 3. Small bilateral pleural effusions with collapse/consolidation in both lower lobes, cannot exclude pneumonia. Electronically Signed   By: Leanna BattlesMelinda  Blietz M.D.   On: 12/17/2016 12:47      Scheduled Meds: . clonazePAM  1.5 mg Oral QHS  . enoxaparin (LOVENOX) injection  40 mg Subcutaneous Q24H  . fluconazole (DIFLUCAN) IV  100 mg Intravenous Q24H  . mometasone-formoterol  2 puff Inhalation BID  . montelukast  10 mg Oral QHS  . nystatin  5 mL Oral QID  . piperacillin-tazobactam (ZOSYN)  IV  3.375 g Intravenous Q8H  . sertraline  200 mg Oral QHS   Continuous Infusions: . dextrose 5 % and 0.9 % NaCl with KCl 40 mEq/L 100 mL/hr at 12/18/16 0938     LOS: 7 days    Time spent in minutes: 35    Janet Humphreys, MD [facility] Pager: [email or web address] Password [email or web address] 12/18/2016, 12:01 PM

## 2016-12-18 NOTE — Progress Notes (Signed)
Asked by Dr. Gerrit FriendsGerkin to evaluate pt for hoarseness.  Pt is POD #5 s/p lap chole. Pt remains in hospital with persistent pancreatitis post op. Pt remains NPO.    [name] can phonate, yet voice is out of breath and extremely quiet. He and his wife maybe note slight improvement today. He is weak and ambulates very slowly, dyspneic and tires rapidly. His voice is a whisper.   [name] was intubated for the cholecystectomy.  On discussion with the CRNA present during the intubation, Ms. Williford,  [name] was electively intubated under direct visualization of the glottis and vocal cords with a VideoGlide scope. The CRNA relates that the entire glottis was well visualized, and appeared normal, with the ETT passing easily. No trauma was noted.   While 2-3 days of hoarseness post-intubation is common, 5 days seems persistent.  Ms. Jeanie SewerWilliford, CRNA will speak with patient again tomorrow. If pt's voice has not improved, suggest consultation with ENT. The patient and his wife agreed, all questions answered.   Sandford Craze Adysen Raphael, MD   [phone]

## 2016-12-18 NOTE — Progress Notes (Signed)
5 Days Post-Op  Subjective: Complaining of back and abdomen, may even be a little confused this AM.  Chest is clear and he does not sound like he has a pulmonary issue.    Objective: Vital signs in last 24 hours: Temp:  [97.4 F (36.3 C)-98.4 F (36.9 C)] 98.4 F (36.9 C) (03/13 0536) Pulse Rate:  [62-66] 62 (03/13 0536) Resp:  [16-18] 16 (03/13 0536) BP: (131-138)/(70-75) 131/70 (03/13 0536) SpO2:  [93 %-98 %] 93 % (03/13 0536) Weight:  [98.9 kg (218 lb 0.6 oz)] 98.9 kg (218 lb 0.6 oz) (03/13 0536) Last BM Date: 12/15/16 240 PO 1029 IV Urine 1825 Drain 5 BM x 3 Afebrile, VSS K+ 2.9 WBC is still up  CT scan 12/17/16:  Worsening pancreatitis. No evidence of necrosis or pseudocyst formation. No definite abscess. 2. Scattered ascites. 3. Small bilateral pleural effusions with collapse/consolidation in both lower lobes, cannot exclude pneumonia.  Intake/Output from previous day: 03/12 0701 - 03/13 0700 In: 1519.2 [P.O.:240; I.V.:1029.2; IV Piggyback:250] Out: 1830 [Urine:1825; Drains:5] Intake/Output this shift: No intake/output data recorded.  General appearance: alert, cooperative, no distress and a little confused. Resp: clear to auscultation bilaterally and down some in the bases but not much GI: soft, complains of midepigastric discomfort.  also back pain, sites all look fine.  Lab Results:   Recent Labs  12/17/16 0541 12/18/16 0443  WBC 25.5* 27.5*  HGB 11.3* 12.2*  HCT 32.8* 34.5*  PLT 174 197    BMET  Recent Labs  12/16/16 0405 12/18/16 0443  NA 135 135  K 3.2* 2.9*  CL 103 101  CO2 26 27  GLUCOSE 103* 118*  BUN 19 14  CREATININE 0.74 0.69  CALCIUM 7.6* 7.6*   PT/INR No results for input(s): LABPROT, INR in the last 72 hours.   Recent Labs Lab 12/11/16 1326 12/12/16 0428 12/12/16 1122 12/16/16 0405 12/18/16 0443  AST 28 20 20 28 26   ALT 24 18 18 26 24   ALKPHOS 29* 26* 31* 60 67  BILITOT 2.5* 2.1* 2.0* 1.8* 2.0*  PROT 6.4* 5.7* 6.2*  4.6* 4.8*  ALBUMIN 3.7 2.7* 3.0* 2.0* 1.9*     Lipase     Component Value Date/Time   LIPASE 36 12/18/2016 0443     Studies/Results: Ct Abdomen Pelvis W Contrast  Result Date: 12/17/2016 CLINICAL DATA:  Laparoscopic cholecystectomy 4 days ago with drain placement, biliary pancreatitis, rising white blood cell count. EXAM: CT ABDOMEN AND PELVIS WITH CONTRAST TECHNIQUE: Multidetector CT imaging of the abdomen and pelvis was performed using the standard protocol following bolus administration of intravenous contrast. CONTRAST:  100mL ISOVUE-300 IOPAMIDOL (ISOVUE-300) INJECTION 61%, 15mL ISOVUE-300 IOPAMIDOL (ISOVUE-300) INJECTION 61% COMPARISON:  12/11/2016. FINDINGS: Lower chest: Small bilateral pleural effusions with collapse/ consolidation in both lower lobes, left greater than right. Heart is enlarged. No pericardial effusion. Hepatobiliary: The liver is unremarkable. Cholecystectomy. No biliary ductal dilatation. Pancreas: Extensive inflammatory stranding and fluid surrounds the pancreas, increased from 12/11/2016. Pancreas appears uniform in attenuation. Spleen: 2.9 cm low-attenuation lesion in the medial spleen is unchanged. Adrenals/Urinary Tract: Adrenal glands and kidneys are unremarkable. Ureters are decompressed. Bladder is grossly unremarkable. Stomach/Bowel: Stomach, small bowel, appendix and colon are unremarkable. Vascular/Lymphatic: Minimal atherosclerotic calcification of the arterial vasculature. Peripancreatic lymph node measures 14 mm. Reproductive: Prostate is visualized. Other: Mild presacral edema. Bilateral inguinal hernias contain fat. Scattered ascites. Extensive peripancreatic fluid and stranding. Percutaneous perihepatic drain in place. Musculoskeletal: No worrisome lytic or sclerotic lesions. Subtotal fusion of the sacroiliac  joints. Degenerative changes are seen in the spine. IMPRESSION: 1. Worsening pancreatitis. No evidence of necrosis or pseudocyst formation. No definite  abscess. 2. Scattered ascites. 3. Small bilateral pleural effusions with collapse/consolidation in both lower lobes, cannot exclude pneumonia. Electronically Signed   By: Leanna Battles M.D.   On: 12/17/2016 12:47    Medications: . Chlorhexidine Gluconate Cloth  6 each Topical Daily  . clonazePAM  1.5 mg Oral QHS  . enoxaparin (LOVENOX) injection  40 mg Subcutaneous Q24H  . mometasone-formoterol  2 puff Inhalation BID  . montelukast  10 mg Oral QHS  . mupirocin ointment  1 application Nasal BID  . piperacillin-tazobactam (ZOSYN)  IV  3.375 g Intravenous Q8H  . potassium chloride  10 mEq Intravenous Q1 Hr x 3  . potassium chloride  10 mEq Intravenous Q1 Hr x 3  . sertraline  200 mg Oral QHS    Assessment/Plan Gallstone pancreatitis LAPAROSCOPIC CHOLECYSTECTOMY WITH INTRAOPERATIVE CHOLANGIOGRAM, 12/13/16, Dr. Carolynne Edouard Post op pancreatitis Post op atelectasis/pleural effusions - ?  Acute kidney injury HYpokalemia - being replaced FEN: IV fluids/clears ID: Zosyn day 6 DVT: Heparin   Plan:  Ice chips and sips, mobilize some and get him OOB, Kpad for the back also.  Labs in for tomorrow alos    LOS: 7 days    Geraline Halberstadt 12/18/[id number]

## 2016-12-19 LAB — URINALYSIS, COMPLETE (UACMP) WITH MICROSCOPIC
BILIRUBIN URINE: NEGATIVE
Bacteria, UA: NONE SEEN
Glucose, UA: NEGATIVE mg/dL
KETONES UR: NEGATIVE mg/dL
LEUKOCYTES UA: NEGATIVE
Nitrite: NEGATIVE
PROTEIN: NEGATIVE mg/dL
SQUAMOUS EPITHELIAL / LPF: NONE SEEN
Specific Gravity, Urine: 1.02 (ref 1.005–1.030)
pH: 7 (ref 5.0–8.0)

## 2016-12-19 LAB — CBC
HCT: 33.8 % — ABNORMAL LOW (ref 39.0–52.0)
Hemoglobin: 11.5 g/dL — ABNORMAL LOW (ref 13.0–17.0)
MCH: 31.3 pg (ref 26.0–34.0)
MCHC: 34 g/dL (ref 30.0–36.0)
MCV: 92.1 fL (ref 78.0–100.0)
PLATELETS: 221 10*3/uL (ref 150–400)
RBC: 3.67 MIL/uL — AB (ref 4.22–5.81)
RDW: 13.5 % (ref 11.5–15.5)
WBC: 24 10*3/uL — AB (ref 4.0–10.5)

## 2016-12-19 LAB — BASIC METABOLIC PANEL
ANION GAP: 6 (ref 5–15)
BUN: 13 mg/dL (ref 6–20)
CALCIUM: 7.3 mg/dL — AB (ref 8.9–10.3)
CO2: 27 mmol/L (ref 22–32)
Chloride: 101 mmol/L (ref 101–111)
Creatinine, Ser: 0.67 mg/dL (ref 0.61–1.24)
Glucose, Bld: 117 mg/dL — ABNORMAL HIGH (ref 65–99)
Potassium: 3.4 mmol/L — ABNORMAL LOW (ref 3.5–5.1)
Sodium: 134 mmol/L — ABNORMAL LOW (ref 135–145)

## 2016-12-19 LAB — PROCALCITONIN: Procalcitonin: 0.79 ng/mL

## 2016-12-19 MED ORDER — SODIUM CHLORIDE 0.9 % IV SOLN
30.0000 meq | Freq: Once | INTRAVENOUS | Status: DC
  Filled 2016-12-19: qty 15

## 2016-12-19 MED ORDER — POTASSIUM CHLORIDE 10 MEQ/100ML IV SOLN
10.0000 meq | INTRAVENOUS | Status: AC
  Administered 2016-12-19 (×3): 10 meq via INTRAVENOUS
  Filled 2016-12-19 (×3): qty 100

## 2016-12-19 MED ORDER — UNJURY CHICKEN SOUP POWDER
8.0000 [oz_av] | Freq: Three times a day (TID) | ORAL | Status: DC
  Filled 2016-12-19 (×13): qty 27

## 2016-12-19 NOTE — Progress Notes (Signed)
PROGRESS NOTE  [name] [name] DOB: 02-Aug-1956 DOA: 12/11/2016 PCP: Sissy Hoff, MD  Brief History:   61 y.o. male with PMH of asthma, anxiety, physically active (ran 4 times per week and lifted weights), was referred by PCP due to nausea, vomiting and abdominal pain. He was diagnosed with acute biliary pancreatitis. General surgery was consulted and he underwent laparoscopic cholecystectomy with intraoperative cholangiogram and placement of drain on 12/13/16. Diet was gradually advanced which she has tolerated. Having BMs. Due to worsening leukocytosis, primary service obtained CT abdomen and pelvis which shows worsening pancreatitis without evidence of necrosis or pseudocyst formation and no definitive abscess, scattered ascites, small bilateral pleural effusions with collapse/consolidation in both lower lobes, cannot exclude pneumonia per report. Patient was made NPO and TRH was requested to reassess.  Patient states that he has been progressively improving since surgery.  Assessment/Plan:  Leukocytosis:  -afebrile and hemodynamically stable -suspect main driver is worsening of his pancreatitis -No respiratory symptoms suggestive of pneumonia. Clinical and radiological findings likely related to atelectasis/pleural effusion.  -Already on Zosyn--plan to d/c 3/15 if stable clinically -Check pro calcitonin 1.24>>>0.79.  -Continue to trend daily CBCs and if leukocytosis keeps worsening then consider further evaluation.  -some watery stools but currently low index of suspicion for C. Difficile--had bisacodyl 3/13 at 0930 -CBC with diff in am -blood culture x 2 if WBC increases again or febrile -check UA and urine culture  Acute biliary pancreatitis:  -Status post laparoscopic cholecystectomy, IOC & drain placement on 12/13/16.  -pain controlled, tolerating diet, having BMs, no fevers, lipase normalized and improved LFTs.  -Has been on IV Zosyn since 12/13/16. Unclear  significance of repeat CT abdomen findings from 3/12 showing worsening pancreatitis but no evidence of necrosis, pseudocyst or abscess.  -No new recommendations.   Lower extremity pain and edema -venous duplex  Hypokalemia:  -Replace and follow. -check mag  Acute kidney injury:  -Resolved.   Anxiety:  -Stable -continue klonopin and zoloft  Asthma:  -Stable without clinical bronchospasm.   Disposition Plan:   Home 2-3 days if stable and WBC trends down Family Communication:  Spouse updated  at bedside  Consultants:  N/A  Code Status:  FULL  DVT Prophylaxis: Ruby Lovenox   Procedures: As Listed in Progress Note Above  Antibiotics: Zosyn 3/8>>>    Subjective: Patient states that abdominal pain is improving. Denies any fevers, chills, chest pain, chest breath, nausea, vomiting, diarrhea. He had 3 loose bowel movements today. Denies any dysuria or hematuria. Denies headache or neck pain. No coughing or hemoptysis.  Objective: Vitals:   12/18/16 2035 12/19/16 0407 12/19/16 0911 12/19/16 1456  BP: 129/85 133/74  134/88  Pulse: 65 (!) 58  65  Resp: 16 14  16   Temp: 98.7 F (37.1 C) 98.4 F (36.9 C)  98.6 F (37 C)  TempSrc: Oral Oral  Oral  SpO2: 95% 97% 94% 97%  Weight:  105 kg (231 lb 7.7 oz)    Height:        Intake/Output Summary (Last 24 hours) at 12/19/16 1831 Last data filed at 12/19/16 0524  Gross per 24 hour  Intake          2861.66 ml  Output                0 ml  Net          2861.66 ml   Weight change: 6.1 kg (13 lb  7.2 oz) Exam:   General:  Pt is alert, follows commands appropriately, not in acute distress  HEENT: No icterus, No thrush, No neck mass, Octa/AT  Cardiovascular: RRR, S1/S2, no rubs, no gallops  Respiratory: Bibasilar crackles. No wheezing. Good air movement  Abdomen: Soft/+BS, non tender, non distended, no guarding  Extremities: 1  +LE edema, No lymphangitis, No petechiae, No rashes, no synovitis   Data Reviewed: I have  personally reviewed following labs and imaging studies Basic Metabolic Panel:  Recent Labs Lab 12/16/16 0405 12/18/16 0443 12/19/16 0400  NA 135 135 134*  K 3.2* 2.9* 3.4*  CL 103 101 101  CO2 26 27 27   GLUCOSE 103* 118* 117*  BUN 19 14 13   CREATININE 0.74 0.69 0.67  CALCIUM 7.6* 7.6* 7.3*  MG  --  1.9  --    Liver Function Tests:  Recent Labs Lab 12/16/16 0405 12/18/16 0443  AST 28 26  ALT 26 24  ALKPHOS 60 67  BILITOT 1.8* 2.0*  PROT 4.6* 4.8*  ALBUMIN 2.0* 1.9*    Recent Labs Lab 12/13/16 0414 12/16/16 0405 12/18/16 0443  LIPASE 27 22 36   No results for input(s): AMMONIA in the last 168 hours. Coagulation Profile: No results for input(s): INR, PROTIME in the last 168 hours. CBC:  Recent Labs Lab 12/13/16 0414 12/16/16 0405 12/17/16 0541 12/18/16 0443 12/19/16 0400  WBC 9.6 17.2* 25.5* 27.5* 24.0*  NEUTROABS  --  15.1* 23.5*  --   --   HGB 13.6 11.3* 11.3* 12.2* 11.5*  HCT 39.3 32.4* 32.8* 34.5* 33.8*  MCV 95.4 93.9 91.9 92.5 92.1  PLT 164 149* 174 197 221   Cardiac Enzymes: No results for input(s): CKTOTAL, CKMB, CKMBINDEX, TROPONINI in the last 168 hours. BNP: Invalid input(s): POCBNP CBG: No results for input(s): GLUCAP in the last 168 hours. HbA1C: No results for input(s): HGBA1C in the last 72 hours. Urine analysis:    Component Value Date/Time   COLORURINE AMBER (A) 12/11/2016 1208   APPEARANCEUR HAZY (A) 12/11/2016 1208   LABSPEC 1.033 (H) 12/11/2016 1208   PHURINE 5.0 12/11/2016 1208   GLUCOSEU NEGATIVE 12/11/2016 1208   HGBUR NEGATIVE 12/11/2016 1208   BILIRUBINUR SMALL (A) 12/11/2016 1208   KETONESUR NEGATIVE 12/11/2016 1208   PROTEINUR 100 (A) 12/11/2016 1208   NITRITE NEGATIVE 12/11/2016 1208   LEUKOCYTESUR NEGATIVE 12/11/2016 1208   Sepsis Labs: @LABRCNTIP (procalcitonin:4,lacticidven:4) ) Recent Results (from the past 240 hour(s))  Culture, blood (Routine X 2) w Reflex to ID Panel     Status: None   Collection Time:  12/11/16  7:42 PM  Result Value Ref Range Status   Specimen Description BLOOD LEFT ANTECUBITAL  Final   Special Requests BOTTLES DRAWN AEROBIC AND ANAEROBIC 5CC  Final   Culture   Final    NO GROWTH 5 DAYS Performed at [facility], [address] [place], [place] [address]    Report Status 12/17/2016 FINAL  Final  Culture, blood (Routine X 2) w Reflex to ID Panel     Status: None   Collection Time: 12/11/16  7:42 PM  Result Value Ref Range Status   Specimen Description BLOOD LEFT ANTECUBITAL  Final   Special Requests BOTTLES DRAWN AEROBIC AND ANAEROBIC 5CC  Final   Culture   Final    NO GROWTH 5 DAYS Performed at [facility], [address] [place], [place] [address]    Report Status 12/17/2016 FINAL  Final  Surgical PCR screen  Status: Abnormal   Collection Time: 12/13/16 10:55 AM  Result Value Ref Range Status   MRSA, PCR NEGATIVE NEGATIVE Final   Staphylococcus aureus POSITIVE (A) NEGATIVE Final    Comment:        The Xpert SA Assay (FDA approved for NASAL specimens in patients over 55 years of age), is one component of a comprehensive surveillance program.  Test performance has been validated by [facility] for patients greater than or equal to 63 year old. It is not intended to diagnose infection nor to guide or monitor treatment.      Scheduled Meds: . clonazePAM  1.5 mg Oral QHS  . enoxaparin (LOVENOX) injection  40 mg Subcutaneous Q24H  . fluconazole (DIFLUCAN) IV  100 mg Intravenous Q24H  . mometasone-formoterol  2 puff Inhalation BID  . montelukast  10 mg Oral QHS  . nystatin  5 mL Oral QID  . piperacillin-tazobactam (ZOSYN)  IV  3.375 g Intravenous Q8H  . protein supplement  8 oz Oral TID  . sertraline  200 mg Oral QHS   Continuous Infusions: . dextrose 5 % and 0.9 % NaCl with KCl 40 mEq/L 100 mL/hr at 12/19/16 1024    Procedures/Studies: Ct Abdomen Pelvis Wo Contrast  Result Date: 12/11/2016 CLINICAL DATA:  Abdominal pain since  yesterday. EXAM: CT ABDOMEN AND PELVIS WITHOUT CONTRAST TECHNIQUE: Multidetector CT imaging of the abdomen and pelvis was performed following the standard protocol without IV contrast. COMPARISON:  None. FINDINGS: Lower chest: Small bilateral pleural effusions and bibasilar atelectasis. The heart is mildly enlarged. Coronary artery calcifications are noted. The distal esophagus is grossly normal. Hepatobiliary: No focal hepatic lesions or intrahepatic biliary dilatation. There are gallstones and sludge noted in the gallbladder. No common bile duct dilatation. Pancreas: The pancreas is enlarged and diffusely inflamed. There is also extensive peripancreatic inflammation/phlegmon. Small fluid collections likely represent developing pseudocysts. No pancreatic mass. No pancreatic duct dilatation. No obvious obstructing common bile duct stone. There is a small duodenal diverticulum noted at near the pancreatic head. Spleen: Normal size. Simple appearing cysts noted. Peripancreatic fluid. Adrenals/Urinary Tract: The adrenal glands and kidneys are unremarkable. No renal, ureteral or bladder calculi or mass. Moderate fluid and inflammation noted in the anterior pararenal spaces, left greater than right and also fluid tracking back along the pericolic gutters. Stomach/Bowel: The stomach, duodenum, small bowel and colon are unremarkable. No inflammatory changes, mass lesions or obstructive findings. The terminal ileum is normal. The appendix is normal. Vascular/Lymphatic: No significant aortic calcifications or aneurysm. Small scattered mesenteric and retroperitoneal lymph nodes but no mass or adenopathy. Reproductive: The prostate gland and seminal vesicles are unremarkable. Other: Small to moderate amount of free pelvic fluid. No inguinal mass or adenopathy. Musculoskeletal: No significant bony findings. IMPRESSION: CT findings consistent with acute pancreatitis as discussed above. Gallstones and gallbladder sludge are  noted. No obvious common bile duct stone. Small bilateral pleural effusions and bibasilar atelectasis. Electronically Signed   By: Rudie Meyer M.D.   On: 12/11/2016 16:11   Dg Chest 2 View  Result Date: 12/11/2016 CLINICAL DATA:  Shortness of Breath EXAM: CHEST  2 VIEW COMPARISON:  None. FINDINGS: Low lung volumes with bibasilar atelectasis or infiltrates. Mild cardiomegaly. No overt edema. No effusions or acute bony abnormality. IMPRESSION: Mild cardiomegaly.  Bibasilar atelectasis or infiltrates. Electronically Signed   By: Charlett Nose M.D.   On: 12/11/2016 14:04   Dg Cholangiogram Operative  Result Date: 12/13/2016 CLINICAL DATA:  61 year old male with a history of  cholecystitis EXAM: INTRAOPERATIVE CHOLANGIOGRAM TECHNIQUE: Cholangiographic images from the C-arm fluoroscopic device were submitted for interpretation post-operatively. Please see the procedural report for the amount of contrast and the fluoroscopy time utilized. COMPARISON:  Ultrasound 12/11/2016 FINDINGS: Surgical instruments project over the upper abdomen. There is cannulation of the cystic duct/gallbladder neck, with antegrade infusion of contrast. Caliber of the extrahepatic ductal system within normal limits. No large filling defect identified. Free flow of contrast across the ampulla. IMPRESSION: Intraoperative cholangiogram demonstrates extrahepatic biliary ducts of unremarkable caliber, with no large filling defect identified. Free flow of contrast across the ampulla. Please refer to the dictated operative report for full details of intraoperative findings and procedure Electronically Signed   By: Gilmer Mor D.O.   On: 12/13/2016 16:31   Ct Abdomen Pelvis W Contrast  Result Date: 12/17/2016 CLINICAL DATA:  Laparoscopic cholecystectomy 4 days ago with drain placement, biliary pancreatitis, rising white blood cell count. EXAM: CT ABDOMEN AND PELVIS WITH CONTRAST TECHNIQUE: Multidetector CT imaging of the abdomen and pelvis was  performed using the standard protocol following bolus administration of intravenous contrast. CONTRAST:  ISOVUE-300 IOPAMIDOL (ISOVUE-300) INJECTION 61%, 15mL ISOVUE-300 IOPAMIDOL (ISOVUE-300) INJECTION 61% COMPARISON:  12/11/2016. FINDINGS: Lower chest: Small bilateral pleural effusions with collapse/ consolidation in both lower lobes, left greater than right. Heart is enlarged. No pericardial effusion. Hepatobiliary: The liver is unremarkable. Cholecystectomy. No biliary ductal dilatation. Pancreas: Extensive inflammatory stranding and fluid surrounds the pancreas, increased from 12/11/2016. Pancreas appears uniform in attenuation. Spleen: 2.9 cm low-attenuation lesion in the medial spleen is unchanged. Adrenals/Urinary Tract: Adrenal glands and kidneys are unremarkable. Ureters are decompressed. Bladder is grossly unremarkable. Stomach/Bowel: Stomach, small bowel, appendix and colon are unremarkable. Vascular/Lymphatic: Minimal atherosclerotic calcification of the arterial vasculature. Peripancreatic lymph node measures 14 mm. Reproductive: Prostate is visualized. Other: Mild presacral edema. Bilateral inguinal hernias contain fat. Scattered ascites. Extensive peripancreatic fluid and stranding. Percutaneous perihepatic drain in place. Musculoskeletal: No worrisome lytic or sclerotic lesions. Subtotal fusion of the sacroiliac joints. Degenerative changes are seen in the spine. IMPRESSION: 1. Worsening pancreatitis. No evidence of necrosis or pseudocyst formation. No definite abscess. 2. Scattered ascites. 3. Small bilateral pleural effusions with collapse/consolidation in both lower lobes, cannot exclude pneumonia. Electronically Signed   By: Leanna Battles M.D.   On: 12/17/2016 12:47   US Abdomen Limited Ruq  Result Date: 12/11/2016 CLINICAL DATA:  Acute pancreatitis EXAM: US ABDOMEN LIMITED - RIGHT UPPER QUADRANT COMPARISON:  CT abdomen pelvis 12/11/2016 FINDINGS: Gallbladder: Multiple gallstones  measuring up to 8 mm in diameter. Gallbladder wall not significantly thickened. Positive sonographic Murphy sign. Common bile duct: Diameter: 4.5 mm Liver: No focal lesion identified. Within normal limits in parenchymal echogenicity. Small amount of fluid around the liver IMPRESSION: Cholelithiasis. Patient tender over the gallbladder compatible with positive sonographic Murphy sign. No biliary dilatation. Small amount of free fluid around the liver. Electronically Signed   By: Marlan Palau M.D.   On: 12/11/2016 17:29    Samina Weekes, [place], DO  [facility] Pager [phone]  If 7PM-7AM, please contact night-coverage [email or web address] Password [facility] 12/19/2016, 6:31 PM   LOS: 8 days

## 2016-12-19 NOTE — Progress Notes (Signed)
Initial Nutrition Assessment  INTERVENTION:   Provide Unjury chicken Soup TID, Each serving provides 100kcal and 21g protein  Encourage PO intake RD will continue to monitor for plan  If patient unable to have diet advanced by 3/15, will need to consider nutrition support. Recommend feeding tube placed beyond the ligament of Treitz.  NUTRITION DIAGNOSIS:   Increased nutrient needs related to other (see comment) (pancreatitis) as evidenced by estimated needs.  GOAL:   Patient will meet greater than or equal to 90% of their needs  MONITOR:   PO intake, Supplement acceptance, Labs, Weight trends, I & O's  REASON FOR ASSESSMENT:   NPO/Clear Liquid Diet, LOS, Other (Comment) (x 8 days, Nursing Consult)   ASSESSMENT:    61 y.o. male with no significant past medical history that comes in for abdominal pain that started 2 days prior to admission. He relates he was nauseated and started vomiting with significant abdominal pain no hematemesis. 3/8: s/p LAPAROSCOPIC CHOLECYSTECTOMY WITH INTRAOPERATIVE CHOLANGIOGRAM  RD approached by pt's RN regarding pt's wife's concerns regarding his nutrition. Pt's diet has not been able to be advanced and stay advanced beyond 1 day during his 8 day LOS. Pt in restroom during visit, but RD gathered history from pt's wife. Pt's wife very anxious and concerned that the patient has not eaten beyond clear liquids in 10 days. Pt's wife states the pt was not eating well 3 days PTA. Pt's wife wanted information about TPN. RD discussed the risks of TPN and recommended a feeding tube placement if pt was unable to take in adequate calories/protein and have diet advanced soon.  RD to order the patient Unjury Chicken soup supplements to provide additional protein and calories.  Will continue to monitor PO intake and attempt NFPE at follow-up as pt is at risk of malnutrition given poor PO.  Per pt's wife, UBW is 205 lb. At admission pt weighed 184 lb, now has gained 47  lb in 8 days. Most likely fluid gain.  Medications: D5 and .9% NaCl w/ KCl infusion at 100 ml/hr -provides 408 kcal Labs reviewed: Low Na, K Mg WNL  Diet Order:  Diet clear liquid Room service appropriate? Yes; Fluid consistency: Thin  Skin:  Reviewed, no issues  Last BM:  3/14  Height:   Ht Readings from Last 1 Encounters:  12/11/16 6\' 2"  (1.88 m)    Weight:   Wt Readings from Last 1 Encounters:  12/19/16 231 lb 7.7 oz (105 kg)    Ideal Body Weight:  86.3 kg  BMI:  Body mass index is 29.72 kg/m.  Estimated Nutritional Needs:   Kcal:  2100-2300  Protein:  120-130g  Fluid:  2.3L/day  EDUCATION NEEDS:   Education needs addressed  Tilda FrancoLindsey Nallely Yost, MS, RD, LDN Pager: [phone] After Hours Pager: [phone]

## 2016-12-19 NOTE — Care Management Note (Signed)
Case Management Note  Patient Details  Name: [name] FrederickDavid A [name] MRN: [id number] Date of Birth: 1955-11-24  Subjective/Objective:    61 yo admitted with Acute Gallstone Pancreatitis.                 Action/Plan: Pt from home with spouse. Pt independent with ADLs prior to admission. Chart reviewed and CM following for DC needs.  Expected Discharge Date:   (unknown)               Expected Discharge Plan:  Home/Self Care  In-House Referral:     Discharge planning Services  CM Consult  Post Acute Care Choice:    Choice offered to:     DME Arranged:    DME Agency:     HH Arranged:    HH Agency:     Status of Service:  In process, will continue to follow  If discussed at Long Length of Stay Meetings, dates discussed:    Additional CommentsBartholome Bill:  Rissie Sculley H, RN 12/19/2016, 11:27 AM [phone]

## 2016-12-19 NOTE — Progress Notes (Signed)
Anesthesia eval  Pt states his voice is pretty much back to normal. I recommended that he see an ENT for evaluation if he feels differently after discharge. Objectively, he is improving clinically. I encouraged him to contact the anesthesia department if he has any other concerns or needs.

## 2016-12-19 NOTE — Progress Notes (Signed)
6 Days Post-Op  Subjective: Voice is marginally better.  He denies any abdomina pain, still complains of back pain.  Says the K pad helps, but does not want to use it now.  Drain removed this AM.  He is asking about PO's.  He still seems a bit confused.  Wanted blinds closed.  He say he walked once yesterday and only remembers using IS x 1  Tongue still coated with candidiasis.   Objective: Vital signs in last 24 hours: Temp:  [98.3 F (36.8 C)-98.7 F (37.1 C)] 98.4 F (36.9 C) (03/14 0407) Pulse Rate:  [58-65] 58 (03/14 0407) Resp:  [14-18] 14 (03/14 0407) BP: (129-138)/(74-85) 133/74 (03/14 0407) SpO2:  [92 %-97 %] 97 % (03/14 0407) Weight:  [105 kg (231 lb 7.7 oz)] 105 kg (231 lb 7.7 oz) (03/14 0407) Last BM Date: 12/18/16 2900 IV Voided x 5 Drain 0 BM x 5  Afebrile, VSS K+ up to 3.4 this AM; mag 1.9 yesterday WBC down some to 24K H/H stable  CT on 12/17/16   Intake/Output from previous day: 03/13 0701 - 03/14 0700 In: 2911.7 [I.V.:2111.7; IV Piggyback:200] Out: 0  Intake/Output this shift: No intake/output data recorded.  General appearance: alert, cooperative, no distress and tongue is still coated with white. Resp: clear to auscultation bilaterally and decreased some in the bases GI: soft, non-tender; bowel sounds normal; no masses,  no organomegaly and nothing from the drain and it was removed. Port sites all look fine.  Lab Results:   Recent Labs  12/18/16 0443 12/19/16 0400  WBC 27.5* 24.0*  HGB 12.2* 11.5*  HCT 34.5* 33.8*  PLT 197 221    BMET  Recent Labs  12/18/16 0443 12/19/16 0400  NA 135 134*  K 2.9* 3.4*  CL 101 101  CO2 27 27  GLUCOSE 118* 117*  BUN 14 13  CREATININE 0.69 0.67  CALCIUM 7.6* 7.3*   PT/INR No results for input(s): LABPROT, INR in the last 72 hours.   Recent Labs Lab 12/12/16 1122 12/16/16 0405 12/18/16 0443  AST 20 28 26   ALT 18 26 24   ALKPHOS 31* 60 67  BILITOT 2.0* 1.8* 2.0*  PROT 6.2* 4.6* 4.8*  ALBUMIN  3.0* 2.0* 1.9*     Lipase     Component Value Date/Time   LIPASE 36 12/18/2016 0443     Studies/Results: Ct Abdomen Pelvis W Contrast  Result Date: 12/17/2016 CLINICAL DATA:  Laparoscopic cholecystectomy 4 days ago with drain placement, biliary pancreatitis, rising white blood cell count. EXAM: CT ABDOMEN AND PELVIS WITH CONTRAST TECHNIQUE: Multidetector CT imaging of the abdomen and pelvis was performed using the standard protocol following bolus administration of intravenous contrast. CONTRAST:  100mL ISOVUE-300 IOPAMIDOL (ISOVUE-300) INJECTION 61%, 15mL ISOVUE-300 IOPAMIDOL (ISOVUE-300) INJECTION 61% COMPARISON:  12/11/2016. FINDINGS: Lower chest: Small bilateral pleural effusions with collapse/ consolidation in both lower lobes, left greater than right. Heart is enlarged. No pericardial effusion. Hepatobiliary: The liver is unremarkable. Cholecystectomy. No biliary ductal dilatation. Pancreas: Extensive inflammatory stranding and fluid surrounds the pancreas, increased from 12/11/2016. Pancreas appears uniform in attenuation. Spleen: 2.9 cm low-attenuation lesion in the medial spleen is unchanged. Adrenals/Urinary Tract: Adrenal glands and kidneys are unremarkable. Ureters are decompressed. Bladder is grossly unremarkable. Stomach/Bowel: Stomach, small bowel, appendix and colon are unremarkable. Vascular/Lymphatic: Minimal atherosclerotic calcification of the arterial vasculature. Peripancreatic lymph node measures 14 mm. Reproductive: Prostate is visualized. Other: Mild presacral edema. Bilateral inguinal hernias contain fat. Scattered ascites. Extensive peripancreatic fluid and stranding. Percutaneous  perihepatic drain in place. Musculoskeletal: No worrisome lytic or sclerotic lesions. Subtotal fusion of the sacroiliac joints. Degenerative changes are seen in the spine. IMPRESSION: 1. Worsening pancreatitis. No evidence of necrosis or pseudocyst formation. No definite abscess. 2. Scattered ascites.  3. Small bilateral pleural effusions with collapse/consolidation in both lower lobes, cannot exclude pneumonia. Electronically Signed   By: Leanna Battles M.D.   On: 12/17/2016 12:47    Medications: . clonazePAM  1.5 mg Oral QHS  . enoxaparin (LOVENOX) injection  40 mg Subcutaneous Q24H  . fluconazole (DIFLUCAN) IV  100 mg Intravenous Q24H  . mometasone-formoterol  2 puff Inhalation BID  . montelukast  10 mg Oral QHS  . nystatin  5 mL Oral QID  . piperacillin-tazobactam (ZOSYN)  IV  3.375 g Intravenous Q8H  . sertraline  200 mg Oral QHS    Assessment/Plan Gallstone pancreatitis LAPAROSCOPIC CHOLECYSTECTOMY WITH INTRAOPERATIVE CHOLANGIOGRAM, 12/13/16, Dr. Carolynne Edouard   POD #6 Post op pancreatitis Oral candidiasis Voice loss/hoarseness Post op atelectasis/pleural effusions - ?  Acute kidney injury HYpokalemia - being replaced Dr. Butler Denmark FEN: IV fluids/clears ID: Zosyn day 7 Diflucan day 2 Nystatin day 2 DVT: Lovenox   Plan:  Will review with Dr. Gerrit Friends, discuss Zosyn and starting clears.      LOS: 8 days    Krysteena Stalker 12/19/2016 [phone]

## 2016-12-20 ENCOUNTER — Encounter (HOSPITAL_COMMUNITY): Payer: Self-pay | Admitting: Radiology

## 2016-12-20 ENCOUNTER — Inpatient Hospital Stay (HOSPITAL_COMMUNITY): Payer: BC Managed Care – PPO

## 2016-12-20 DIAGNOSIS — R609 Edema, unspecified: Secondary | ICD-10-CM

## 2016-12-20 DIAGNOSIS — D72829 Elevated white blood cell count, unspecified: Secondary | ICD-10-CM

## 2016-12-20 DIAGNOSIS — J90 Pleural effusion, not elsewhere classified: Secondary | ICD-10-CM

## 2016-12-20 LAB — BASIC METABOLIC PANEL
ANION GAP: 6 (ref 5–15)
BUN: 11 mg/dL (ref 6–20)
CO2: 23 mmol/L (ref 22–32)
Calcium: 7.5 mg/dL — ABNORMAL LOW (ref 8.9–10.3)
Chloride: 104 mmol/L (ref 101–111)
Creatinine, Ser: 0.57 mg/dL — ABNORMAL LOW (ref 0.61–1.24)
GFR calc Af Amer: >60 mL/min (ref >60)
GFR calc non Af Amer: >60 mL/min (ref >60)
GLUCOSE: 119 mg/dL — AB (ref 65–99)
POTASSIUM: 3.8 mmol/L (ref 3.5–5.1)
SODIUM: 133 mmol/L — AB (ref 135–145)

## 2016-12-20 LAB — C DIFFICILE QUICK SCREEN W PCR REFLEX
C DIFFICLE (CDIFF) ANTIGEN: NEGATIVE
C Diff interpretation: NOT DETECTED
C Diff toxin: NEGATIVE

## 2016-12-20 LAB — CBC WITH DIFFERENTIAL/PLATELET
BASOS ABS: 0 10*3/uL (ref 0.0–0.1)
Basophils Relative: 0 %
EOS PCT: 0 %
Eosinophils Absolute: 0 10*3/uL (ref 0.0–0.7)
HCT: 33.1 % — ABNORMAL LOW (ref 39.0–52.0)
Hemoglobin: 11.3 g/dL — ABNORMAL LOW (ref 13.0–17.0)
LYMPHS PCT: 2 %
Lymphs Abs: 0.6 10*3/uL — ABNORMAL LOW (ref 0.7–4.0)
MCH: 31.7 pg (ref 26.0–34.0)
MCHC: 34.1 g/dL (ref 30.0–36.0)
MCV: 92.7 fL (ref 78.0–100.0)
Monocytes Absolute: 1.3 10*3/uL — ABNORMAL HIGH (ref 0.1–1.0)
Monocytes Relative: 5 %
NEUTROS PCT: 93 %
Neutro Abs: 22.6 10*3/uL — ABNORMAL HIGH (ref 1.7–7.7)
PLATELETS: 239 10*3/uL (ref 150–400)
RBC: 3.57 MIL/uL — AB (ref 4.22–5.81)
RDW: 13.7 % (ref 11.5–15.5)
WBC: 24.5 10*3/uL — AB (ref 4.0–10.5)

## 2016-12-20 LAB — LACTIC ACID, PLASMA: Lactic Acid, Venous: 0.9 mmol/L (ref 0.5–1.9)

## 2016-12-20 LAB — PROCALCITONIN: PROCALCITONIN: 0.52 ng/mL

## 2016-12-20 LAB — MAGNESIUM: MAGNESIUM: 1.9 mg/dL (ref 1.7–2.4)

## 2016-12-20 MED ORDER — LIDOCAINE HCL 4 % EX SOLN
0.0000 mL | Freq: Once | CUTANEOUS | Status: DC | PRN
  Filled 2016-12-20: qty 50

## 2016-12-20 MED ORDER — LIDOCAINE-EPINEPHRINE (PF) 1 %-1:200000 IJ SOLN
0.0000 mL | Freq: Once | INTRAMUSCULAR | Status: DC | PRN
  Filled 2016-12-20: qty 30

## 2016-12-20 MED ORDER — TRIPLE ANTIBIOTIC 3.5-400-5000 EX OINT
1.0000 "application " | TOPICAL_OINTMENT | Freq: Once | CUTANEOUS | Status: DC | PRN
  Filled 2016-12-20: qty 1

## 2016-12-20 MED ORDER — SILVER NITRATE-POT NITRATE 75-25 % EX MISC
10.0000 | Freq: Once | CUTANEOUS | Status: DC | PRN
  Filled 2016-12-20 (×2): qty 10

## 2016-12-20 MED ORDER — LIDOCAINE HCL 2 % EX GEL
1.0000 "application " | Freq: Once | CUTANEOUS | Status: DC | PRN
  Filled 2016-12-20: qty 5

## 2016-12-20 MED ORDER — SODIUM CHLORIDE 0.9 % IV SOLN
INTRAVENOUS | Status: DC
  Administered 2016-12-20: 1000 mL via INTRAVENOUS
  Administered 2016-12-20: 17:00:00 via INTRAVENOUS

## 2016-12-20 MED ORDER — IOPAMIDOL (ISOVUE-300) INJECTION 61%
INTRAVENOUS | Status: AC
  Administered 2016-12-20: 75 mL
  Filled 2016-12-20: qty 75

## 2016-12-20 MED ORDER — OXYMETAZOLINE HCL 0.05 % NA SOLN
1.0000 | Freq: Once | NASAL | Status: DC | PRN
  Filled 2016-12-20 (×2): qty 15

## 2016-12-20 NOTE — Progress Notes (Addendum)
PROGRESS NOTE  [name] WJX:[id number] DOB: 26-Jul-1956 DOA: 12/11/2016 PCP: Sissy Hoff, MD  Brief History:   61 y.o.malewith PMH of asthma, anxiety, physically active (ran 4 times per week and lifted weights), was referred by PCP due to nausea, vomiting and abdominal pain. He was diagnosed with acute biliary pancreatitis. General surgery was consulted and he underwent laparoscopic cholecystectomy with intraoperative cholangiogram and placement of drain on 12/13/16. Diet was gradually advanced which she has tolerated. Having BMs. Due to worsening leukocytosis, primary service obtained repeat CT abdomen and pelvis on 12/17/16 which shows worsening pancreatitis without evidence of necrosis or pseudocyst formation and no definitive abscess, scattered ascites, small bilateral pleural effusions with collapse/consolidation in both lower lobes, cannot exclude pneumonia per report. Patient was made NPOand TRH was requested to reassess.  Assessment/Plan: Leukocytosis: -afebrile and hemodynamically stable -suspect main driver is worsening of his pancreatitis -No respiratory symptoms suggestive of pneumonia. Clinical and radiological findings likely related to atelectasis/pleural effusion.  -Already on Zosyn--plan to d/c 3/15 if stable clinically -Check pro calcitonin 1.24>>>0.79.  -Continue to trend daily CBCs and if leukocytosis keeps worsening then consider further evaluation.  -continues to have 3 watery stools daily-->check Cdiff -12/20/16 CT chest--small bilateral pleural effusions with bibasilar collapse and scattered upper lobe air space disease -blood culture x 2 if WBC increases again or febrile -check UA--negative for pyuria -discontinue zosyn -consult pulmonary  Acute biliary pancreatitis: -Status post laparoscopic cholecystectomy, IOC &drain placement on 12/13/16.  -pain controlled, tolerating diet, having BMs, no fevers, lipase normalized and improved LFTs.  -Has been  on IV Zosyn since 12/13/16. Unclear significance of repeat CT abdomen findings from 3/12 showing worsening pancreatitis but no evidence of necrosis, pseudocyst or abscess.  -unclear clinical significance of CT with normal lipase (36) and clinical stability -wife requests GI evaluation which I have consulted   Lower extremity pain and edema -venous duplex--pending  Hypokalemia:  -Replace and follow. -check mag--1.9  Acute kidney injury:  -Resolved.   Anxiety: -Stable -continue klonopin and zoloft  Asthma:  -Stable without clinical bronchospasm.   Disposition Plan:   Home 2-3 days if stable and WBC trends down Family Communication:  Spouse updated  at bedside--Total time spent 35 minutes.  Greater than 50% spent face to face counseling and coordinating care.   Consultants:  pulmonary; [facility]  Code Status:  FULL  DVT Prophylaxis: [place] Lovenox   Procedures: As Listed in Progress Note Above  Antibiotics: Zosyn 3/8>>>          Subjective: Patient complains of intermittent abdominal pain but overall improving. Has some nausea without emesis. The patient continues to have loose stools. In the last 24 hours. Denies any emesis, dysuria, hematuria, headache, neck pain, fevers, chills, shortness of breath.  Objective: Vitals:   12/19/16 1947 12/19/16 2020 12/20/16 0644 12/20/16 0855  BP:  (!) 141/68 134/80   Pulse: 65 63 62   Resp: 16 20 20    Temp:  97.4 F (36.3 C) 98.2 F (36.8 C)   TempSrc:  Oral Oral   SpO2: 95% 97% 96% 94%  Weight:   100 kg (220 lb 8 oz)   Height:        Intake/Output Summary (Last 24 hours) at 12/20/16 1232 Last data filed at 12/20/16 1145  Gross per 24 hour  Intake              800 ml  Output  650 ml  Net              150 ml   Weight change: -4.982 kg (-10 lb 15.7 oz) Exam:   General:  Pt is alert, follows commands appropriately, not in acute distress  HEENT: No icterus, No thrush, No neck mass,  [place]/AT  Cardiovascular: RRR, S1/S2, no rubs, no gallops  Respiratory: Bibasilar crackles but no wheezing. Good air movement.  Abdomen: Soft/+BS, mild epigastric tender, non distended, no guarding  Extremities: 1 + LE edema, No lymphangitis, No petechiae, No rashes, no synovitis   Data Reviewed: I have personally reviewed following labs and imaging studies Basic Metabolic Panel:  Recent Labs Lab 12/16/16 0405 12/18/16 0443 12/19/16 0400 12/20/16 0432  NA 135 135 134* 133*  K 3.2* 2.9* 3.4* 3.8  CL 103 101 101 104  CO2 26 27 27 23   GLUCOSE 103* 118* 117* 119*  BUN 19 14 13 11   CREATININE 0.74 0.69 0.67 0.57*  CALCIUM 7.6* 7.6* 7.3* 7.5*  MG  --  1.9  --  1.9   Liver Function Tests:  Recent Labs Lab 12/16/16 0405 12/18/16 0443  AST 28 26  ALT 26 24  ALKPHOS 60 67  BILITOT 1.8* 2.0*  PROT 4.6* 4.8*  ALBUMIN 2.0* 1.9*    Recent Labs Lab 12/16/16 0405 12/18/16 0443  LIPASE 22 36   No results for input(s): AMMONIA in the last 168 hours. Coagulation Profile: No results for input(s): INR, PROTIME in the last 168 hours. CBC:  Recent Labs Lab 12/16/16 0405 12/17/16 0541 12/18/16 0443 12/19/16 0400 12/20/16 0432  WBC 17.2* 25.5* 27.5* 24.0* 24.5*  NEUTROABS 15.1* 23.5*  --   --  22.6*  HGB 11.3* 11.3* 12.2* 11.5* 11.3*  HCT 32.4* 32.8* 34.5* 33.8* 33.1*  MCV 93.9 91.9 92.5 92.1 92.7  PLT 149* 174 197 221 239   Cardiac Enzymes: No results for input(s): CKTOTAL, CKMB, CKMBINDEX, TROPONINI in the last 168 hours. BNP: Invalid input(s): POCBNP CBG: No results for input(s): GLUCAP in the last 168 hours. HbA1C: No results for input(s): HGBA1C in the last 72 hours. Urine analysis:    Component Value Date/Time   COLORURINE AMBER (A) 12/19/2016 2004   APPEARANCEUR CLEAR 12/19/2016 2004   LABSPEC 1.020 12/19/2016 2004   PHURINE 7.0 12/19/2016 2004   GLUCOSEU NEGATIVE 12/19/2016 2004   HGBUR SMALL (A) 12/19/2016 2004   BILIRUBINUR NEGATIVE 12/19/2016 2004    KETONESUR NEGATIVE 12/19/2016 2004   PROTEINUR NEGATIVE 12/19/2016 2004   NITRITE NEGATIVE 12/19/2016 2004   LEUKOCYTESUR NEGATIVE 12/19/2016 2004   Sepsis Labs: @LABRCNTIP (procalcitonin:4,lacticidven:4) ) Recent Results (from the past 240 hour(s))  Culture, blood (Routine X 2) w Reflex to ID Panel     Status: None   Collection Time: 12/11/16  7:42 PM  Result Value Ref Range Status   Specimen Description BLOOD LEFT ANTECUBITAL  Final   Special Requests BOTTLES DRAWN AEROBIC AND ANAEROBIC 5CC  Final   Culture   Final    NO GROWTH 5 DAYS Performed at [facility], [address], [place], [place] [address]    Report Status 12/17/2016 FINAL  Final  Culture, blood (Routine X 2) w Reflex to ID Panel     Status: None   Collection Time: 12/11/16  7:42 PM  Result Value Ref Range Status   Specimen Description BLOOD LEFT ANTECUBITAL  Final   Special Requests BOTTLES DRAWN AEROBIC AND ANAEROBIC 5CC  Final   Culture   Final  NO GROWTH 5 DAYS Performed at [facility], [address] [place], [place] [address]    Report Status 12/17/2016 FINAL  Final  Surgical PCR screen     Status: Abnormal   Collection Time: 12/13/16 10:55 AM  Result Value Ref Range Status   MRSA, PCR NEGATIVE NEGATIVE Final   Staphylococcus aureus POSITIVE (A) NEGATIVE Final    Comment:        The Xpert SA Assay (FDA approved for NASAL specimens in patients over 63 years of age), is one component of a comprehensive surveillance program.  Test performance has been validated by [facility] for patients greater than or equal to 412 year old. It is not intended to diagnose infection nor to guide or monitor treatment.      Scheduled Meds: . clonazePAM  1.5 mg Oral QHS  . enoxaparin (LOVENOX) injection  40 mg Subcutaneous Q24H  . fluconazole (DIFLUCAN) IV  100 mg Intravenous Q24H  . mometasone-formoterol  2 puff Inhalation BID  . montelukast  10 mg Oral QHS  . nystatin  5 mL Oral QID  .  piperacillin-tazobactam (ZOSYN)  IV  3.375 g Intravenous Q8H  . protein supplement  8 oz Oral TID  . sertraline  200 mg Oral QHS   Continuous Infusions: . dextrose 5 % and 0.9 % NaCl with KCl 40 mEq/L 1,000 mL (12/20/16 0620)    Procedures/Studies: Ct Abdomen Pelvis Wo Contrast  Result Date: 12/11/2016 CLINICAL DATA:  Abdominal pain since yesterday. EXAM: CT ABDOMEN AND PELVIS WITHOUT CONTRAST TECHNIQUE: Multidetector CT imaging of the abdomen and pelvis was performed following the standard protocol without IV contrast. COMPARISON:  None. FINDINGS: Lower chest: Small bilateral pleural effusions and bibasilar atelectasis. The heart is mildly enlarged. Coronary artery calcifications are noted. The distal esophagus is grossly normal. Hepatobiliary: No focal hepatic lesions or intrahepatic biliary dilatation. There are gallstones and sludge noted in the gallbladder. No common bile duct dilatation. Pancreas: The pancreas is enlarged and diffusely inflamed. There is also extensive peripancreatic inflammation/phlegmon. Small fluid collections likely represent developing pseudocysts. No pancreatic mass. No pancreatic duct dilatation. No obvious obstructing common bile duct stone. There is a small duodenal diverticulum noted at near the pancreatic head. Spleen: Normal size. Simple appearing cysts noted. Peripancreatic fluid. Adrenals/Urinary Tract: The adrenal glands and kidneys are unremarkable. No renal, ureteral or bladder calculi or mass. Moderate fluid and inflammation noted in the anterior pararenal spaces, left greater than right and also fluid tracking back along the pericolic gutters. Stomach/Bowel: The stomach, duodenum, small bowel and colon are unremarkable. No inflammatory changes, mass lesions or obstructive findings. The terminal ileum is normal. The appendix is normal. Vascular/Lymphatic: No significant aortic calcifications or aneurysm. Small scattered mesenteric and retroperitoneal lymph nodes but  no mass or adenopathy. Reproductive: The prostate gland and seminal vesicles are unremarkable. Other: Small to moderate amount of free pelvic fluid. No inguinal mass or adenopathy. Musculoskeletal: No significant bony findings. IMPRESSION: CT findings consistent with acute pancreatitis as discussed above. Gallstones and gallbladder sludge are noted. No obvious common bile duct stone. Small bilateral pleural effusions and bibasilar atelectasis. Electronically Signed   By: Rudie MeyerP.  Gallerani M.D.   On: 12/11/2016 16:11   Dg Chest 2 View  Result Date: 12/11/2016 CLINICAL DATA:  Shortness of Breath EXAM: CHEST  2 VIEW COMPARISON:  None. FINDINGS: Low lung volumes with bibasilar atelectasis or infiltrates. Mild cardiomegaly. No overt edema. No effusions or acute bony abnormality. IMPRESSION: Mild cardiomegaly.  Bibasilar atelectasis or  infiltrates. Electronically Signed   By: Charlett Nose M.D.   On: 12/11/2016 14:04   Dg Cholangiogram Operative  Result Date: 12/13/2016 CLINICAL DATA:  61 year old male with a history of cholecystitis EXAM: INTRAOPERATIVE CHOLANGIOGRAM TECHNIQUE: Cholangiographic images from the C-arm fluoroscopic device were submitted for interpretation post-operatively. Please see the procedural report for the amount of contrast and the fluoroscopy time utilized. COMPARISON:  Ultrasound 12/11/2016 FINDINGS: Surgical instruments project over the upper abdomen. There is cannulation of the cystic duct/gallbladder neck, with antegrade infusion of contrast. Caliber of the extrahepatic ductal system within normal limits. No large filling defect identified. Free flow of contrast across the ampulla. IMPRESSION: Intraoperative cholangiogram demonstrates extrahepatic biliary ducts of unremarkable caliber, with no large filling defect identified. Free flow of contrast across the ampulla. Please refer to the dictated operative report for full details of intraoperative findings and procedure Electronically Signed    By: Gilmer Mor D.O.   On: 12/13/2016 16:31   Ct Chest W Contrast  Result Date: 12/20/2016 CLINICAL DATA:  Leukocytosis. Laparoscopic cholecystectomy on 12/13/2016. EXAM: CT CHEST WITH CONTRAST TECHNIQUE: Multidetector CT imaging of the chest was performed during intravenous contrast administration. CONTRAST:  75mL ISOVUE-300 IOPAMIDOL (ISOVUE-300) INJECTION 61% COMPARISON:  Plain films 12/11/2016. abdominopelvic CT of 12/17/2016. FINDINGS: Cardiovascular: Tortuous thoracic aorta. Mild cardiomegaly, without pericardial effusion. Lad coronary artery atherosclerosis. Mediastinum/Nodes: No mediastinal or hilar adenopathy. Lungs/Pleura: Small bilateral pleural effusions. Mild motion degradation throughout. Collapse/consolidative change in both lung bases. Mild patchy ground-glass opacities involving both upper lobes. Upper Abdomen: Normal imaged portions of the liver, stomach. Hypoattenuating posterior splenic low-density lesion is incompletely imaged and grossly similar to the prior to dedicated CT. Upper abdominal ascites and peripancreatic fluid are incompletely imaged. Musculoskeletal: Right hemidiaphragm elevation. No acute osseous abnormality. IMPRESSION: 1. Mildly motion degraded exam. 2. Small bilateral pleural effusions. Bibasilar collapse/ consolidative. suspicious for pneumonia. Patchy upper lobe ground-glass opacities are also favored to represent infection. These could represent subsegmental atelectasis. 3. Cardiomegaly with LAD coronary artery atherosclerosis. 4. Incompletely imaged upper abdominal findings, as on the dedicated CT of 12/17/2016. Electronically Signed   By: Jeronimo Greaves M.D.   On: 12/20/2016 11:06   Ct Abdomen Pelvis W Contrast  Result Date: 12/17/2016 CLINICAL DATA:  Laparoscopic cholecystectomy 4 days ago with drain placement, biliary pancreatitis, rising white blood cell count. EXAM: CT ABDOMEN AND PELVIS WITH CONTRAST TECHNIQUE: Multidetector CT imaging of the abdomen and  pelvis was performed using the standard protocol following bolus administration of intravenous contrast. CONTRAST:  ISOVUE-300 IOPAMIDOL (ISOVUE-300) INJECTION 61%, 15mL ISOVUE-300 IOPAMIDOL (ISOVUE-300) INJECTION 61% COMPARISON:  12/11/2016. FINDINGS: Lower chest: Small bilateral pleural effusions with collapse/ consolidation in both lower lobes, left greater than right. Heart is enlarged. No pericardial effusion. Hepatobiliary: The liver is unremarkable. Cholecystectomy. No biliary ductal dilatation. Pancreas: Extensive inflammatory stranding and fluid surrounds the pancreas, increased from 12/11/2016. Pancreas appears uniform in attenuation. Spleen: 2.9 cm low-attenuation lesion in the medial spleen is unchanged. Adrenals/Urinary Tract: Adrenal glands and kidneys are unremarkable. Ureters are decompressed. Bladder is grossly unremarkable. Stomach/Bowel: Stomach, small bowel, appendix and colon are unremarkable. Vascular/Lymphatic: Minimal atherosclerotic calcification of the arterial vasculature. Peripancreatic lymph node measures 14 mm. Reproductive: Prostate is visualized. Other: Mild presacral edema. Bilateral inguinal hernias contain fat. Scattered ascites. Extensive peripancreatic fluid and stranding. Percutaneous perihepatic drain in place. Musculoskeletal: No worrisome lytic or sclerotic lesions. Subtotal fusion of the sacroiliac joints. Degenerative changes are seen in the spine. IMPRESSION: 1. Worsening pancreatitis. No evidence of necrosis or pseudocyst  formation. No definite abscess. 2. Scattered ascites. 3. Small bilateral pleural effusions with collapse/consolidation in both lower lobes, cannot exclude pneumonia. Electronically Signed   By: Leanna Battles M.D.   On: 12/17/2016 12:47   US Abdomen Limited Ruq  Result Date: 12/11/2016 CLINICAL DATA:  Acute pancreatitis EXAM: US ABDOMEN LIMITED - RIGHT UPPER QUADRANT COMPARISON:  CT abdomen pelvis 12/11/2016 FINDINGS: Gallbladder: Multiple  gallstones measuring up to 8 mm in diameter. Gallbladder wall not significantly thickened. Positive sonographic Murphy sign. Common bile duct: Diameter: 4.5 mm Liver: No focal lesion identified. Within normal limits in parenchymal echogenicity. Small amount of fluid around the liver IMPRESSION: Cholelithiasis. Patient tender over the gallbladder compatible with positive sonographic Murphy sign. No biliary dilatation. Small amount of free fluid around the liver. Electronically Signed   By: Marlan Palau M.D.   On: 12/11/2016 17:29    Elaysia Devargas, Deontre, DO  [facility] Pager [phone]  If 7PM-7AM, please contact night-coverage [email or web address] Password [email or web address] 12/20/2016, 12:32 PM   LOS: 9 days

## 2016-12-20 NOTE — Consult Note (Signed)
Name: Merlinda FrederickDavid A Etcheverry MRN: [id number] DOB: [birth year]    ADMISSION DATE:  12/11/2016 CONSULTATION DATE:  3/15  REFERRING MD :  TAT  CHIEF COMPLAINT:  Pleural effusions and abnormal CT chest   BRIEF PATIENT DESCRIPTION:  This is a 61 year old male s/p Lap Chole on 3/8. During the surgery a small abscess was discovered in the RUQ at the edge of the liver. A drain was left post-op. Post-op course was c/b rising WBC ct and CT abd was obtained showing worsening pancreatitis but no necrosis, pseudocyst or abscess. It did show bibasilar atx and effusions. PCCM asked to assess given persistent leukocytosis and if this could be Pulmonary in origin.   STUDIES:   CT chest 3/15: . Small bilateral pleural effusions. Bibasilar collapse/ consolidative.Patchy upper lobe ground-glass opacities  HPI  This is a 61 year old male admitted 3/6 w/ gallstone pancreatitis. The lipase trended down and was brought for Lap Chole on 3/8. During the surgery a small abscess was discovered in the RUQ at the edge of the liver. A drain was left post-op. Post-op course was c/b rising WBC ct and CT abd was obtained showing worsening pancreatitis but no necrosis, pseudocyst or abscess. It did show bibasilar atx and effusions. On 3/12 and 3/13 IM service was consulted and identified oral thrush as a possible contributing factor to his leukocytosis and also was identified by anesthesia for on-going vocal hoarseness. They recommended ENT consult if it persists. He had also continued to have watery stools.  On 3/15 a CT chest was obtained to look for explanation of leukocytosis.  PCCM asked to assess given persistent leukocytosis and if this could be Pulmonary in origin.    PAST MEDICAL HISTORY :   has a past medical history of Anxiety and Asthma.  has a past surgical history that includes Gum surgery and Cholecystectomy (N/A, 12/13/2016). Prior to Admission medications   Medication Sig Start Date End Date Taking? Authorizing  Provider  ADVAIR DISKUS 250-50 MCG/DOSE AEPB Inhale 1 puff into the lungs 2 (two) times daily. 11/06/16  Yes Historical Provider, MD  clonazePAM (KLONOPIN) 0.5 MG tablet Take 1.5 mg by mouth at bedtime. 11/08/16  Yes Historical Provider, MD  montelukast (SINGULAIR) 10 MG tablet Take 10 mg by mouth at bedtime. 11/23/16  Yes Historical Provider, MD  Multiple Vitamins-Minerals (MULTIVITAMIN ADULT PO) Take 1 tablet by mouth every evening.   Yes Historical Provider, MD  naproxen sodium (ANAPROX) 220 MG tablet Take 440 mg by mouth every 12 (twelve) hours as needed (pain).   Yes Historical Provider, MD  sertraline (ZOLOFT) 100 MG tablet Take 200 mg by mouth at bedtime. 11/08/16  Yes Historical Provider, MD   No Known Allergies  FAMILY HISTORY:  family history includes Dementia in his mother. SOCIAL HISTORY:  reports that he has never smoked. He has never used smokeless tobacco. He reports that he drinks about 0.6 oz of alcohol per week . He reports that he does not use drugs.  REVIEW OF SYSTEMS:   Constitutional: Negative for fever, chills, weight loss, malaise/fatigue and diaphoresis.  HENT: Negative for hearing loss, ear pain, nosebleeds, congestion, sore throat, neck pain, tinnitus and ear discharge.  weak voice  Eyes: Negative for blurred vision, double vision, photophobia, pain, discharge and redness.  Respiratory: Negative for cough, hemoptysis, sputum production, shortness of breath, wheezing and stridor.   Cardiovascular: Negative for chest pain, palpitations, orthopnea, claudication, leg swelling and PND.  Gastrointestinal: Negative for heartburn, nausea, vomiting, abdominal  pain, diarrhea, constipation, blood in stool and melena.  Genitourinary: Negative for dysuria, urgency, frequency, hematuria and flank pain.  Musculoskeletal: Negative for myalgias, back pain, joint pain and falls.  Skin: Negative for itching and rash.  Neurological: Negative for dizziness, tingling, tremors, sensory change,  speech change, focal weakness, seizures, loss of consciousness, weakness and headaches.  Endo/Heme/Allergies: Negative for environmental allergies and polydipsia. Does not bruise/bleed easily.  SUBJECTIVE: no distress.   VITAL SIGNS: Temp:  [97.4 F (36.3 C)-98.6 F (37 C)] 98.2 F (36.8 C) (03/15 0644) Pulse Rate:  [62-65] 62 (03/15 0644) Resp:  [16-20] 20 (03/15 0644) BP: (134-141)/(68-88) 134/80 (03/15 0644) SpO2:  [94 %-97 %] 94 % (03/15 0855) Weight:  [220 lb 8 oz (100 kg)] 220 lb 8 oz (100 kg) (03/15 0644)  PHYSICAL EXAMINATION: General:  Awake, alert. No distress Neuro:  Awake and alert, no focal def  HEENT:  NCAT, no JVD  Cardiovascular:  RRR w/out MRG Lungs:  Clear and w/out accessory use  Abdomen:  Soft, not tender  Musculoskeletal:  Equal st and bulk Skin:  Warm and dry    Recent Labs Lab 12/18/16 0443 12/19/16 0400 12/20/16 0432  NA 135 134* 133*  K 2.9* 3.4* 3.8  CL 101 101 104  CO2 27 27 23   BUN 14 13 11   CREATININE 0.69 0.67 0.57*  GLUCOSE 118* 117* 119*    Recent Labs Lab 12/18/16 0443 12/19/16 0400 12/20/16 0432  HGB 12.2* 11.5* 11.3*  HCT 34.5* 33.8* 33.1*  WBC 27.5* 24.0* 24.5*  PLT 197 221 239   Ct Chest W Contrast  Result Date: 12/20/2016 CLINICAL DATA:  Leukocytosis. Laparoscopic cholecystectomy on 12/13/2016. EXAM: CT CHEST WITH CONTRAST TECHNIQUE: Multidetector CT imaging of the chest was performed during intravenous contrast administration. CONTRAST:  75mL ISOVUE-300 IOPAMIDOL (ISOVUE-300) INJECTION 61% COMPARISON:  Plain films 12/11/2016. abdominopelvic CT of 12/17/2016. FINDINGS: Cardiovascular: Tortuous thoracic aorta. Mild cardiomegaly, without pericardial effusion. Lad coronary artery atherosclerosis. Mediastinum/Nodes: No mediastinal or hilar adenopathy. Lungs/Pleura: Small bilateral pleural effusions. Mild motion degradation throughout. Collapse/consolidative change in both lung bases. Mild patchy ground-glass opacities involving  both upper lobes. Upper Abdomen: Normal imaged portions of the liver, stomach. Hypoattenuating posterior splenic low-density lesion is incompletely imaged and grossly similar to the prior to dedicated CT. Upper abdominal ascites and peripancreatic fluid are incompletely imaged. Musculoskeletal: Right hemidiaphragm elevation. No acute osseous abnormality. IMPRESSION: 1. Mildly motion degraded exam. 2. Small bilateral pleural effusions. Bibasilar collapse/ consolidative. suspicious for pneumonia. Patchy upper lobe ground-glass opacities are also favored to represent infection. These could represent subsegmental atelectasis. 3. Cardiomegaly with LAD coronary artery atherosclerosis. 4. Incompletely imaged upper abdominal findings, as on the dedicated CT of 12/17/2016. Electronically Signed   By: Jeronimo Greaves M.D.   On: 12/20/2016 11:06    ASSESSMENT / PLAN: Small bilateral pleural effusions w/ associated atelectasis.  -doubt that this explains his on-going leukocytosis and this is a very common finding associated w/ pancreatitis and to date he is 13 liters positive.  Bedside US showed minimal pleural fluid Plan IS Mobilize Lasix   On-going leukocytosis. Source is not clear. I am doubtful that this is pulmonary in etiology. ? On-going pancreatic inflammation??  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

## 2016-12-20 NOTE — Consult Note (Signed)
Referring Provider:  Dr. Arbutus Leasat    Primary Care Physician:  Sissy HoffSWAYNE,[name] Primary Gastroenterologist:  [name]  Reason for Consultation:  Pancreatitis  HPI: [name] is a 61 y.o. male was admitted to the hospital on 12/11/2016 for abdominal pain, nausea and vomiting. [name] was found to have elevated lipase 376. CT scan was performed which showed acute pancreatitis and gallstones. [name] underwent lap chole with  IOC on 12/13/2016.  [name] subsequent a started developing worsening leukocytosis. Repeat CT abdomen pelvis was ordered which showed worsening pancreatitis. Chest CT showed  bilateral pleural effusion and possible pneumonia. GI  is consulted for further evaluation.  [name] seen and examined. According to [name], his abdominal pain is improving. Nausea and vomiting is resolved. Tolerating clear liquid diet. Denied any fever or chills. Complaining of diarrhea with loose stools. Denied any blood in the stool. Denied any dysphagia or odynophagia. Complaining of hoarseness of voice after extubation.   No previous colonoscopy. No family history of colon cancer or pancreatic cancer.      Past Medical History:  Diagnosis Date  . Anxiety   . Asthma    uses inhaler    Past Surgical History:  Procedure Laterality Date  . CHOLECYSTECTOMY N/A 12/13/2016   Procedure: LAPAROSCOPIC CHOLECYSTECTOMY WITH INTRAOPERATIVE CHOLANGIOGRAM;  Surgeon: Chevis PrettyPaul [name], [name];  Location: [facility];  Service: General;  Laterality: N/A;  . GUM SURGERY      Prior to Admission medications   Medication Sig Start Date End Date Taking? Authorizing Provider  ADVAIR DISKUS 250-50 MCG/DOSE AEPB Inhale 1 puff into the lungs 2 (two) times daily. 11/06/16  Yes Historical Provider, [name]  clonazePAM (KLONOPIN) 0.5 MG tablet Take 1.5 mg by mouth at bedtime. 11/08/16  Yes Historical Provider, [name]  montelukast (SINGULAIR) 10 MG tablet Take 10 mg by mouth at bedtime. 11/23/16  Yes Historical Provider, [name]   Multiple Vitamins-Minerals (MULTIVITAMIN ADULT PO) Take 1 tablet by mouth every evening.   Yes Historical Provider, [name]  naproxen sodium (ANAPROX) 220 MG tablet Take 440 mg by mouth every 12 (twelve) hours as needed (pain).   Yes Historical Provider, [name]  sertraline (ZOLOFT) 100 MG tablet Take 200 mg by mouth at bedtime. 11/08/16  Yes Historical Provider, [name]    Scheduled Meds: . clonazePAM  1.5 mg Oral QHS  . enoxaparin (LOVENOX) injection  40 mg Subcutaneous Q24H  . fluconazole (DIFLUCAN) IV  100 mg Intravenous Q24H  . mometasone-formoterol  2 puff Inhalation BID  . montelukast  10 mg Oral QHS  . nystatin  5 mL Oral QID  . piperacillin-tazobactam (ZOSYN)  IV  3.375 g Intravenous Q8H  . protein supplement  8 oz Oral TID  . sertraline  200 mg Oral QHS   Continuous Infusions: . dextrose 5 % and 0.9 % NaCl with KCl 40 mEq/L 1,000 mL (12/20/16 0620)   PRN Meds:.acetaminophen, albuterol, morphine injection, oxyCODONE, polyethylene glycol, sodium phosphate, zolpidem  Allergies as of 12/11/2016  . (No Known Allergies)    Family History  Problem Relation Age of Onset  . Dementia Mother     Social History   Social History  . Marital status: Married    Spouse name: N/A  . Number of children: N/A  . Years of education: N/A   Occupational History  . Not on file.   Social History Main Topics  . Smoking status: Never Smoker  . Smokeless tobacco: Never Used  . Alcohol use 0.6 oz/week    1 Glasses  of wine per week  . Drug use: No  . Sexual activity: Yes   Other Topics Concern  . Not on file   Social History Narrative  . No narrative on file    Review of Systems: All negative except as stated above in HPI. Review of Systems  Constitutional: Negative for chills and fever.  HENT: Negative for hearing loss and tinnitus.   Eyes: Negative for blurred vision and double vision.  Respiratory: Negative for hemoptysis and shortness of breath.   Cardiovascular: Positive for leg  swelling. Negative for chest pain and palpitations.  Gastrointestinal: Positive for abdominal pain and diarrhea. Negative for blood in stool, melena and vomiting.  Genitourinary: Negative for dysuria and urgency.  Musculoskeletal: Positive for back pain.  Skin: Negative for rash.  Neurological: Positive for weakness. Negative for focal weakness and seizures.  Endo/Heme/Allergies: Does not bruise/bleed easily.  Psychiatric/Behavioral: Negative for hallucinations and suicidal ideas.    Physical Exam: Vital signs: Vitals:   12/19/16 2020 12/20/16 0644  BP: (!) 141/68 134/80  Pulse: 63 62  Resp: 20 20  Temp: 97.4 F (36.3 C) 98.2 F (36.8 C)   Last BM Date: 12/20/16   Physical Exam  Constitutional: He is oriented to person, place, and time and well-developed, well-nourished, and in no distress.  HENT:  Head: Normocephalic and atraumatic.  Eyes: EOM are normal. No scleral icterus.  Neck: Normal range of motion. Neck supple.  Cardiovascular: Normal rate, regular rhythm and normal heart sounds.   Pulmonary/Chest: Effort normal. No respiratory distress. He has no wheezes. He exhibits no tenderness.  Anterior exam only  Abdominal: Soft. Bowel sounds are normal. He exhibits distension. He exhibits no mass. There is tenderness. There is no rebound and no guarding.  Mildly distended with epigastric tenderness to palpation  Musculoskeletal: He exhibits edema. He exhibits no tenderness.  1 + LEE  Neurological: He is alert and oriented to person, place, and time.  Skin: Skin is warm. No erythema.  Psychiatric: Mood, memory and judgment normal.  Vitals reviewed.   GI:  Lab Results:  Recent Labs  12/18/16 0443 12/19/16 0400 12/20/16 0432  WBC 27.5* 24.0* 24.5*  HGB 12.2* 11.5* 11.3*  HCT 34.5* 33.8* 33.1*  PLT 197 221 239   BMET  Recent Labs  12/18/16 0443 12/19/16 0400 12/20/16 0432  NA 135 134* 133*  K 2.9* 3.4* 3.8  CL 101 101 104  CO2 27 27 23   GLUCOSE 118* 117*  119*  BUN 14 13 11   CREATININE 0.69 0.67 0.57*  CALCIUM 7.6* 7.3* 7.5*   LFT  Recent Labs  12/18/16 0443  PROT 4.8*  ALBUMIN 1.9*  AST 26  ALT 24  ALKPHOS 67  BILITOT 2.0*   PT/INR No results for input(s): LABPROT, INR in the last 72 hours.   Studies/Results: Ct Chest W Contrast  Result Date: 12/20/2016 CLINICAL DATA:  Leukocytosis. Laparoscopic cholecystectomy on 12/13/2016. EXAM: CT CHEST WITH CONTRAST TECHNIQUE: Multidetector CT imaging of the chest was performed during intravenous contrast administration. CONTRAST:  75mL ISOVUE-300 IOPAMIDOL (ISOVUE-300) INJECTION 61% COMPARISON:  Plain films 12/11/2016. abdominopelvic CT of 12/17/2016. FINDINGS: Cardiovascular: Tortuous thoracic aorta. Mild cardiomegaly, without pericardial effusion. Lad coronary artery atherosclerosis. Mediastinum/Nodes: No mediastinal or hilar adenopathy. Lungs/Pleura: Small bilateral pleural effusions. Mild motion degradation throughout. Collapse/consolidative change in both lung bases. Mild patchy ground-glass opacities involving both upper lobes. Upper Abdomen: Normal imaged portions of the liver, stomach. Hypoattenuating posterior splenic low-density lesion is incompletely imaged and grossly similar to the  prior to dedicated CT. Upper abdominal ascites and peripancreatic fluid are incompletely imaged. Musculoskeletal: Right hemidiaphragm elevation. No acute osseous abnormality. IMPRESSION: 1. Mildly motion degraded exam. 2. Small bilateral pleural effusions. Bibasilar collapse/ consolidative. suspicious for pneumonia. Patchy upper lobe ground-glass opacities are also favored to represent infection. These could represent subsegmental atelectasis. 3. Cardiomegaly with LAD coronary artery atherosclerosis. 4. Incompletely imaged upper abdominal findings, as on the dedicated CT of 12/17/2016. Electronically Signed   By: Jeronimo Greaves M.D.   On: 12/20/2016 11:06    Impression/Plan: Gallstone pancreatitis: status post  laparoscopic cholecystectomy plus IOC on 12/13/2016. Repeat CT scan on March 12th showed worsening pancreatitis. [name] with leukocytosis but he is afebrile. - Leukocytosis. Etiology undetermined. Probably reactive from underlying pancreatitis - Bilateral pleural effusion.??  - Diarrhea. C. difficile negative.  Recommendations ----------------------- - Follow LFTs.  - C. difficile is negative. Diarrhea could be from bile salt. We will get GI pathogen panel. -[name] has  pleural effusion as well as lower extremity edema limiting aggresive hydration. Increase IV hydration to 150 mL/h - Advance diet to full liquid. - GI will follow.    LOS: 9 days   Kathi Der  [name], FACP 12/20/2016, 2:04 PM  Pager [phone] If no answer or after 5 PM call [phone]

## 2016-12-20 NOTE — Consult Note (Signed)
Reason for Consult:loss of voice Referring Physician: Md Ccs, MD  [name] is an 61 y.o. male.  HPI: previously healthy, underwent emergency cholecystectomy one week ago. Since waking up from surgery, he has had no voice. He has occasional aspiration when drinking but he hasn't really eaten anything and has drank small amounts since surgery. He denies sore throat. He denies shortness of breath.  Past Medical History:  Diagnosis Date  . Anxiety   . Asthma    uses inhaler    Past Surgical History:  Procedure Laterality Date  . CHOLECYSTECTOMY N/A 12/13/2016   Procedure: LAPAROSCOPIC CHOLECYSTECTOMY WITH INTRAOPERATIVE CHOLANGIOGRAM;  Surgeon: Autumn Messing III, MD;  Location: [facility];  Service: General;  Laterality: N/A;  . GUM SURGERY      Family History  Problem Relation Age of Onset  . Dementia Mother     Social History:  reports that he has never smoked. He has never used smokeless tobacco. He reports that he drinks about 0.6 oz of alcohol per week . He reports that he does not use drugs.  Allergies: No Known Allergies  Medications: Reviewed  Results for orders placed or performed during the hospital encounter of 12/11/16 (from the past 48 hour(s))  Procalcitonin     Status: None   Collection Time: 12/19/16  4:00 AM  Result Value Ref Range   Procalcitonin 0.79 ng/mL    Comment:        Interpretation: PCT > 0.5 ng/mL and <= 2 ng/mL: Systemic infection (sepsis) is possible, but other conditions are known to elevate PCT as well. (NOTE)         ICU PCT Algorithm               Non ICU PCT Algorithm    ----------------------------     ------------------------------         PCT < 0.25 ng/mL                 PCT < 0.1 ng/mL     Stopping of antibiotics            Stopping of antibiotics       strongly encouraged.               strongly encouraged.    ----------------------------     ------------------------------       PCT level decrease by               PCT < 0.25 ng/mL        >= 80% from peak PCT       OR PCT 0.25 - 0.5 ng/mL          Stopping of antibiotics                                             encouraged.     Stopping of antibiotics           encouraged.    ----------------------------     ------------------------------       PCT level decrease by              PCT >= 0.25 ng/mL       < 80% from peak PCT        AND PCT >= 0.5 ng/mL             Continuing antibiotics  encouraged.       Continuing antibiotics            encouraged.    ----------------------------     ------------------------------     PCT level increase compared          PCT > 0.5 ng/mL         with peak PCT AND          PCT >= 0.5 ng/mL             Escalation of antibiotics                                          strongly encouraged.      Escalation of antibiotics        strongly encouraged.   CBC     Status: Abnormal   Collection Time: 12/19/16  4:00 AM  Result Value Ref Range   WBC 24.0 (H) 4.0 - 10.5 K/uL   RBC 3.67 (L) 4.22 - 5.81 MIL/uL   Hemoglobin 11.5 (L) 13.0 - 17.0 g/dL   HCT 33.8 (L) 39.0 - 52.0 %   MCV 92.1 78.0 - 100.0 fL   MCH 31.3 26.0 - 34.0 pg   MCHC 34.0 30.0 - 36.0 g/dL   RDW 13.5 11.5 - 15.5 %   Platelets 221 150 - 400 K/uL  Basic metabolic panel     Status: Abnormal   Collection Time: 12/19/16  4:00 AM  Result Value Ref Range   Sodium 134 (L) 135 - 145 mmol/L   Potassium 3.4 (L) 3.5 - 5.1 mmol/L   Chloride 101 101 - 111 mmol/L   CO2 27 22 - 32 mmol/L   Glucose, Bld 117 (H) 65 - 99 mg/dL   BUN 13 6 - 20 mg/dL   Creatinine, Ser 0.67 0.61 - 1.24 mg/dL   Calcium 7.3 (L) 8.9 - 10.3 mg/dL   GFR calc non Af Amer >60 >60 mL/min   GFR calc Af Amer >60 >60 mL/min    Comment: (NOTE) The eGFR has been calculated using the CKD EPI equation. This calculation has not been validated in all clinical situations. eGFR's persistently <60 mL/min signify possible Chronic Kidney Disease.    Anion gap 6 5 - 15   Urinalysis, Complete w Microscopic     Status: Abnormal   Collection Time: 12/19/16  8:04 PM  Result Value Ref Range   Color, Urine AMBER (A) YELLOW    Comment: BIOCHEMICALS MAY BE AFFECTED BY COLOR   APPearance CLEAR CLEAR   Specific Gravity, Urine 1.020 1.005 - 1.030   pH 7.0 5.0 - 8.0   Glucose, UA NEGATIVE NEGATIVE mg/dL   Hgb urine dipstick SMALL (A) NEGATIVE   Bilirubin Urine NEGATIVE NEGATIVE   Ketones, ur NEGATIVE NEGATIVE mg/dL   Protein, ur NEGATIVE NEGATIVE mg/dL   Nitrite NEGATIVE NEGATIVE   Leukocytes, UA NEGATIVE NEGATIVE   RBC / HPF 6-30 0 - 5 RBC/hpf   WBC, UA 0-5 0 - 5 WBC/hpf   Bacteria, UA NONE SEEN NONE SEEN   Squamous Epithelial / LPF NONE SEEN NONE SEEN   Mucous PRESENT    Hyaline Casts, UA PRESENT   CBC with Differential/Platelet     Status: Abnormal   Collection Time: 12/20/16  4:32 AM  Result Value Ref Range   WBC 24.5 (H) 4.0 - 10.5 K/uL   RBC 3.57 (L) 4.22 -  5.81 MIL/uL   Hemoglobin 11.3 (L) 13.0 - 17.0 g/dL   HCT 33.1 (L) 39.0 - 52.0 %   MCV 92.7 78.0 - 100.0 fL   MCH 31.7 26.0 - 34.0 pg   MCHC 34.1 30.0 - 36.0 g/dL   RDW 13.7 11.5 - 15.5 %   Platelets 239 150 - 400 K/uL   Neutrophils Relative % 93 %   Neutro Abs 22.6 (H) 1.7 - 7.7 K/uL   Lymphocytes Relative 2 %   Lymphs Abs 0.6 (L) 0.7 - 4.0 K/uL   Monocytes Relative 5 %   Monocytes Absolute 1.3 (H) 0.1 - 1.0 K/uL   Eosinophils Relative 0 %   Eosinophils Absolute 0.0 0.0 - 0.7 K/uL   Basophils Relative 0 %   Basophils Absolute 0.0 0.0 - 0.1 K/uL  Basic metabolic panel     Status: Abnormal   Collection Time: 12/20/16  4:32 AM  Result Value Ref Range   Sodium 133 (L) 135 - 145 mmol/L   Potassium 3.8 3.5 - 5.1 mmol/L   Chloride 104 101 - 111 mmol/L   CO2 23 22 - 32 mmol/L   Glucose, Bld 119 (H) 65 - 99 mg/dL   BUN 11 6 - 20 mg/dL   Creatinine, Ser 0.57 (L) 0.61 - 1.24 mg/dL   Calcium 7.5 (L) 8.9 - 10.3 mg/dL   GFR calc non Af Amer >60 >60 mL/min   GFR calc Af Amer >60 >60 mL/min     Comment: (NOTE) The eGFR has been calculated using the CKD EPI equation. This calculation has not been validated in all clinical situations. eGFR's persistently <60 mL/min signify possible Chronic Kidney Disease.    Anion gap 6 5 - 15  Magnesium     Status: None   Collection Time: 12/20/16  4:32 AM  Result Value Ref Range   Magnesium 1.9 1.7 - 2.4 mg/dL  C difficile quick scan w PCR reflex     Status: None   Collection Time: 12/20/16 12:31 PM  Result Value Ref Range   C Diff antigen NEGATIVE NEGATIVE   C Diff toxin NEGATIVE NEGATIVE   C Diff interpretation No C. difficile detected.     Ct Chest W Contrast  Result Date: 12/20/2016 CLINICAL DATA:  Leukocytosis. Laparoscopic cholecystectomy on 12/13/2016. EXAM: CT CHEST WITH CONTRAST TECHNIQUE: Multidetector CT imaging of the chest was performed during intravenous contrast administration. CONTRAST:  84m ISOVUE-300 IOPAMIDOL (ISOVUE-300) INJECTION 61% COMPARISON:  Plain films 12/11/2016. abdominopelvic CT of 12/17/2016. FINDINGS: Cardiovascular: Tortuous thoracic aorta. Mild cardiomegaly, without pericardial effusion. Lad coronary artery atherosclerosis. Mediastinum/Nodes: No mediastinal or hilar adenopathy. Lungs/Pleura: Small bilateral pleural effusions. Mild motion degradation throughout. Collapse/consolidative change in both lung bases. Mild patchy ground-glass opacities involving both upper lobes. Upper Abdomen: Normal imaged portions of the liver, stomach. Hypoattenuating posterior splenic low-density lesion is incompletely imaged and grossly similar to the prior to dedicated CT. Upper abdominal ascites and peripancreatic fluid are incompletely imaged. Musculoskeletal: Right hemidiaphragm elevation. No acute osseous abnormality. IMPRESSION: 1. Mildly motion degraded exam. 2. Small bilateral pleural effusions. Bibasilar collapse/ consolidative. suspicious for pneumonia. Patchy upper lobe ground-glass opacities are also favored to represent  infection. These could represent subsegmental atelectasis. 3. Cardiomegaly with LAD coronary artery atherosclerosis. 4. Incompletely imaged upper abdominal findings, as on the dedicated CT of 12/17/2016. Electronically Signed   By: KAbigail MiyamotoM.D.   On: 12/20/2016 11:06    RAST:[id number] as listed in admit H&P  Blood pressure 134/80, pulse 62, temperature 98.2  F (36.8 C), temperature source Oral, resp. rate 20, height 6' 2"  (1.88 m), weight 100 kg (220 lb 8 oz), SpO2 94 %.  PHYSICAL EXAM: Overall appearance:  Healthy appearing, in no distress  Voice is very breathy and weak. Head:  Normocephalic, atraumatic. Ears: External ears look healthy. Nose: External nose is healthy in appearance. Internal nasal exam free of any lesions or obstruction.diffuse crusting of the right nasal cavity mucosa. Oral Cavity/Pharynx:  There are no mucosal lesions or masses identified. Larynx/Hypopharynx: left vocal cord paralysis. Neuro:  No identifiable neurologic deficits. Neck: No palpable neck masses.  Studies Reviewed: none  Procedures: flexible fiberoptic laryngoscopy. Procedure note: Topical Afrin/Xylocaine was sprayed into the nasal cavities. The flexible fiberoptic scope was passed down the left nasal cavity and into the nasopharynx and oropharynx. The larynx was inspected. Epiglottis was normal but omega shaped. Hypopharynx was clear. There is no obvious mucosal lesions identified. The left vocal cord is immobile.   Assessment/Plan: New-onset left vocal cord paralysis. When occurring immediately after endotracheal intubation, it is felt to be related to a neuro-apraxia of the internal branch of the recurrent laryngeal nerve secondary to pressure from the endotracheal tube and/or cuff. The most important symptoms from this are weak breathy voice and aspiration of liquids. He has only drank very small amount so we can't really be sure about aspiration just yet. I would like to get a modified barium  swallow to see if he is having any significant aspiration. That will determine if we need to limit or modify what he eats and drinks. The second problem is vocal weakness. We discussed that over time this may spontaneously improve. We discussed that medialization laryngoplasty can help the voice and swallowing if necessary. All questions were answered.   Jais Demir 12/20/2016, 5:37 PM

## 2016-12-20 NOTE — Consult Note (Signed)
STAFF NOTE: I, Dr Lavinia SharpsM Tahirih Lair have personally reviewed patient's available data, including medical history, events of note, physical examination and test results as part of my evaluation. I have discussed with resident/NP and other care providers such as pharmacist, RN and RRT.  In addition,  I personally evaluated patient and elicited key findings of   S: [name] yearo old very functional male and per wife running and lifitng weight at baseline. Admitted 12/11/2016 with Gallstone pancretitis and s/p lap chole 12/13/16. Initial improvement in wbc as below but since then worsening without fever (last fever 12/13/16).Underwent CT abdomen 12/17/16 that showed wosening pancreatitis and CT chest 12/20/16 with bilateral small pleural effusion with comperssive atelectasis  O:  Vitals:   12/19/16 1947 12/19/16 2020 12/20/16 0644 12/20/16 0855  BP:  (!) 141/68 134/80   Pulse: 65 63 62   Resp: 16 20 20    Temp:  97.4 F (36.3 C) 98.2 F (36.8 C)   TempSrc:  Oral Oral   SpO2: 95% 97% 96% 94%  Weight:   100 kg (220 lb 8 oz)   Height:       Deconditioned CTA biltaterally Diminished at bases No distress Soft voice (having vocal cord evaluation)   Results for Merlinda FrederickMURR, Deklan A (MRN [id number]) as of 12/20/2016 16:22  Ref. Range 12/11/2016 13:26 12/11/2016 19:42 12/12/2016 04:28 12/13/2016 04:14 12/16/2016 04:05 12/17/2016 05:41 12/18/2016 04:43 12/19/2016 04:00 12/20/2016 04:32  WBC Latest Ref Range: 4.0 - 10.5 K/uL 22.5 (H) 15.0 (H) 13.9 (H) 9.6 17.2 (H) 25.5 (H) 27.5 (H) 24.0 (H) 24.5 (H)   A: Leukocytosis -likely due to Pancreatitis and unrelated to effusion Bilateral effuson pleural - likely related to low albumin state. No evidnce of pneumonia  P: Get ECHO Check PCT and lactate - rule out sepsis syndrome Diurse pe triad based on echo result and clnical status  Wife updated  PCCM will sign off   .  Rest per NP/medical resident whose note is outlined above and that I agree with   Dr. Kalman ShanMurali Lema Heinkel, M.D.,  [facility]F.C.C.P Pulmonary and Critical Care Medicine Staff Physician [facility] [place] [facility] Pager: [phone], If no answer or between  15:00h - 7:00h: call [phone]  12/20/2016 4:35 PM

## 2016-12-20 NOTE — Progress Notes (Signed)
**  Preliminary report by tech**  Bilateral lower extremity venous duplex completed. There is no evidence of deep or superficial vein thrombosis involving the right and left lower extremities. All visualized vessels appear patent and compressible. There is no evidence of Baker's cysts bilaterally.  12/20/16 1:36 PM Olen CordialGreg Jaye Saal RVT

## 2016-12-20 NOTE — Progress Notes (Signed)
7 Days Post-Op  Subjective: Feels a little better, WBC still up.  Stools still a little loose.  I have not checked a C diff.  Sites all look fine.    Objective: Vital signs in last 24 hours: Temp:  [97.4 F (36.3 C)-98.6 F (37 C)] 98.2 F (36.8 C) (03/15 0644) Pulse Rate:  [62-65] 62 (03/15 0644) Resp:  [16-20] 20 (03/15 0644) BP: (134-141)/(68-88) 134/80 (03/15 0644) SpO2:  [94 %-97 %] 94 % (03/15 0855) Weight:  [100 kg (220 lb 8 oz)] 100 kg (220 lb 8 oz) (03/15 0644) Last BM Date: 12/20/16 800 IV PO nothing recorded Urine 300 Stool x 3 Afebrile, VSS, WBC is still 24.5 BMP OK CT chest yesterday:  2. Small bilateral pleural effusions. Bibasilar collapse/ consolidative. suspicious for pneumonia. Patchy upper lobe ground-glass opacities are also favored to represent infection. These could represent subsegmental atelectasis. 3. Cardiomegaly with LAD coronary artery atherosclerosis. 12/17/16 CT abd:  1. Worsening pancreatitis. No evidence of necrosis or pseudocyst formation. No definite abscess. 2. Scattered ascites. 3. Small bilateral pleural effusions with collapse/consolidation in both lower lobes, cannot exclude pneumonia. Intake/Output from previous day: 03/14 0701 - 03/15 0700 In: 800 [I.V.:800] Out: 300 [Urine:300] Intake/Output this shift: Total I/O In: -  Out: 350 [Urine:350]  General appearance: alert, cooperative, no distress and voice is still very weak, hoarsness remains. GI: soft still a bit sore mid abdomen.  Lab Results:   Recent Labs  12/19/16 0400 12/20/16 0432  WBC 24.0* 24.5*  HGB 11.5* 11.3*  HCT 33.8* 33.1*  PLT 221 239    BMET  Recent Labs  12/19/16 0400 12/20/16 0432  NA 134* 133*  K 3.4* 3.8  CL 101 104  CO2 27 23  GLUCOSE 117* 119*  BUN 13 11  CREATININE 0.67 0.57*  CALCIUM 7.3* 7.5*   PT/INR No results for input(s): LABPROT, INR in the last 72 hours.   Recent Labs Lab 12/16/16 0405 12/18/16 0443  AST 28 26  ALT 26  24  ALKPHOS 60 67  BILITOT 1.8* 2.0*  PROT 4.6* 4.8*  ALBUMIN 2.0* 1.9*     Lipase     Component Value Date/Time   LIPASE 36 12/18/2016 0443     Studies/Results: Ct Chest W Contrast  Result Date: 12/20/2016 CLINICAL DATA:  Leukocytosis. Laparoscopic cholecystectomy on 12/13/2016. EXAM: CT CHEST WITH CONTRAST TECHNIQUE: Multidetector CT imaging of the chest was performed during intravenous contrast administration. CONTRAST:  75mL ISOVUE-300 IOPAMIDOL (ISOVUE-300) INJECTION 61% COMPARISON:  Plain films 12/11/2016. abdominopelvic CT of 12/17/2016. FINDINGS: Cardiovascular: Tortuous thoracic aorta. Mild cardiomegaly, without pericardial effusion. Lad coronary artery atherosclerosis. Mediastinum/Nodes: No mediastinal or hilar adenopathy. Lungs/Pleura: Small bilateral pleural effusions. Mild motion degradation throughout. Collapse/consolidative change in both lung bases. Mild patchy ground-glass opacities involving both upper lobes. Upper Abdomen: Normal imaged portions of the liver, stomach. Hypoattenuating posterior splenic low-density lesion is incompletely imaged and grossly similar to the prior to dedicated CT. Upper abdominal ascites and peripancreatic fluid are incompletely imaged. Musculoskeletal: Right hemidiaphragm elevation. No acute osseous abnormality. IMPRESSION: 1. Mildly motion degraded exam. 2. Small bilateral pleural effusions. Bibasilar collapse/ consolidative. suspicious for pneumonia. Patchy upper lobe ground-glass opacities are also favored to represent infection. These could represent subsegmental atelectasis. 3. Cardiomegaly with LAD coronary artery atherosclerosis. 4. Incompletely imaged upper abdominal findings, as on the dedicated CT of 12/17/2016. Electronically Signed   By: Jeronimo GreavesKyle  Talbot M.D.   On: 12/20/2016 11:06    Medications: . clonazePAM  1.5 mg Oral QHS  .  enoxaparin (LOVENOX) injection  40 mg Subcutaneous Q24H  . fluconazole (DIFLUCAN) IV  100 mg Intravenous Q24H   . mometasone-formoterol  2 puff Inhalation BID  . montelukast  10 mg Oral QHS  . nystatin  5 mL Oral QID  . piperacillin-tazobactam (ZOSYN)  IV  3.375 g Intravenous Q8H  . protein supplement  8 oz Oral TID  . sertraline  200 mg Oral QHS    Assessment/Plan Gallstone pancreatitis LAPAROSCOPIC CHOLECYSTECTOMY WITH INTRAOPERATIVE CHOLANGIOGRAM, 12/13/16, Dr. Carolynne Edouard   POD #7 Post op pancreatitis Oral candidiasis Voice loss/hoarseness Post op atelectasis/pleural effusions - ?  Acute kidney injury HYpokalemia - being replaced Dr. Butler Denmark FEN: IV fluids/clears ID: Zosyn day 8 Diflucan day 3 Nystatin day 3 DVT: Lovenox    Plan:  Clears  See how he does. C diff PCR is pending.   LOS: 9 days    [name] 12/20/2016 [phone]

## 2016-12-20 NOTE — Progress Notes (Signed)
Nutrition Brief Note  Pt initially assessed 3/14. Pt's wife continues to reach out to RD regarding patient's nutrition status. States the patient has not received any Un[place] chicken soup supplement despite it being ordered. Pt on a clear liquid diet.   Would consider nutrition support if pt continues to eat poorly and cannot have diet advanced. Recommend feeding tube placed beyond the ligament of Treitz.  At time of visit, pt was in room with MD. Per pt's wife they have been waiting to talk to him so will attempt to see patient at a later time. Will also monitor for POC.  If nutrition issues arise, please consult RD.   Tilda FrancoLindsey Jeanetta Alonzo, MS, RD, LDN Pager: [phone] After Hours Pager: [phone]

## 2016-12-21 ENCOUNTER — Inpatient Hospital Stay (HOSPITAL_COMMUNITY): Payer: BC Managed Care – PPO

## 2016-12-21 LAB — CBC WITH DIFFERENTIAL/PLATELET
BASOS ABS: 0 10*3/uL (ref 0.0–0.1)
BASOS PCT: 0 %
EOS ABS: 0 10*3/uL (ref 0.0–0.7)
Eosinophils Relative: 0 %
HCT: 36 % — ABNORMAL LOW (ref 39.0–52.0)
Hemoglobin: 12.7 g/dL — ABNORMAL LOW (ref 13.0–17.0)
LYMPHS ABS: 0.7 10*3/uL (ref 0.7–4.0)
LYMPHS PCT: 3 %
MCH: 33.2 pg (ref 26.0–34.0)
MCHC: 35.3 g/dL (ref 30.0–36.0)
MCV: 94 fL (ref 78.0–100.0)
MONO ABS: 1.5 10*3/uL — AB (ref 0.1–1.0)
Monocytes Relative: 7 %
NEUTROS PCT: 90 %
Neutro Abs: 20.6 10*3/uL — ABNORMAL HIGH (ref 1.7–7.7)
Platelets: 303 10*3/uL (ref 150–400)
RBC: 3.83 MIL/uL — ABNORMAL LOW (ref 4.22–5.81)
RDW: 13.8 % (ref 11.5–15.5)
WBC: 22.8 10*3/uL — AB (ref 4.0–10.5)

## 2016-12-21 LAB — URINE CULTURE: CULTURE: NO GROWTH

## 2016-12-21 LAB — COMPREHENSIVE METABOLIC PANEL
ALT: 44 U/L (ref 17–63)
AST: 44 U/L — AB (ref 15–41)
Albumin: 1.9 g/dL — ABNORMAL LOW (ref 3.5–5.0)
Alkaline Phosphatase: 89 U/L (ref 38–126)
Anion gap: 7 (ref 5–15)
BUN: 10 mg/dL (ref 6–20)
CHLORIDE: 100 mmol/L — AB (ref 101–111)
CO2: 25 mmol/L (ref 22–32)
CREATININE: 0.69 mg/dL (ref 0.61–1.24)
Calcium: 7.6 mg/dL — ABNORMAL LOW (ref 8.9–10.3)
Glucose, Bld: 88 mg/dL (ref 65–99)
POTASSIUM: 3.4 mmol/L — AB (ref 3.5–5.1)
SODIUM: 132 mmol/L — AB (ref 135–145)
Total Bilirubin: 1.7 mg/dL — ABNORMAL HIGH (ref 0.3–1.2)
Total Protein: 5.3 g/dL — ABNORMAL LOW (ref 6.5–8.1)

## 2016-12-21 LAB — PHOSPHORUS: PHOSPHORUS: 3.5 mg/dL (ref 2.5–4.6)

## 2016-12-21 LAB — MAGNESIUM: MAGNESIUM: 1.8 mg/dL (ref 1.7–2.4)

## 2016-12-21 LAB — PROCALCITONIN: PROCALCITONIN: 0.55 ng/mL

## 2016-12-21 LAB — LIPASE, BLOOD: Lipase: 33 U/L (ref 11–51)

## 2016-12-21 MED ORDER — FUROSEMIDE 10 MG/ML IJ SOLN
20.0000 mg | Freq: Once | INTRAMUSCULAR | Status: AC
  Administered 2016-12-21: 20 mg via INTRAVENOUS
  Filled 2016-12-21: qty 2

## 2016-12-21 MED ORDER — ACETAMINOPHEN 500 MG PO TABS
1000.0000 mg | ORAL_TABLET | ORAL | Status: DC | PRN
  Administered 2016-12-21 – 2016-12-23 (×6): 1000 mg via ORAL
  Filled 2016-12-21 (×6): qty 2

## 2016-12-21 NOTE — Progress Notes (Signed)
[facility] Progress Note  Subjective: Patient states his abdomen is feeling better today. Less abdominal pain. Tolerating full liquids.  Objective: Vital signs in last 24 hours: Temp:  [98.5 F (36.9 C)-98.8 F (37.1 C)] 98.8 F (37.1 C) (03/16 0556) Pulse Rate:  [63-67] 67 (03/16 0556) Resp:  [18-20] 20 (03/16 0556) BP: (138-146)/(76-77) 138/76 (03/16 0556) SpO2:  [94 %-97 %] 97 % (03/16 0556) Weight:  [100.7 kg (222 lb 0.1 oz)] 100.7 kg (222 lb 0.1 oz) (03/16 0556) Weight change: 0.682 kg (1 lb 8.1 oz)   PE:  No distress  Heart regular rhythm  Lungs decreased breath sounds  Abdomen soft and nontender  Lab Results: Results for orders placed or performed during the hospital encounter of 12/11/16 (from the past 24 hour(s))  C difficile quick scan w PCR reflex     Status: None   Collection Time: 12/20/16 12:31 PM  Result Value Ref Range   C Diff antigen NEGATIVE NEGATIVE   C Diff toxin NEGATIVE NEGATIVE   C Diff interpretation No C. difficile detected.   Procalcitonin - Baseline     Status: None   Collection Time: 12/20/16  5:32 PM  Result Value Ref Range   Procalcitonin 0.52 ng/mL  Lactic acid, plasma     Status: None   Collection Time: 12/20/16  5:32 PM  Result Value Ref Range   Lactic Acid, Venous 0.9 0.5 - 1.9 mmol/L  Procalcitonin     Status: None   Collection Time: 12/21/16  4:11 AM  Result Value Ref Range   Procalcitonin 0.55 ng/mL  Comprehensive metabolic panel     Status: Abnormal   Collection Time: 12/21/16  4:11 AM  Result Value Ref Range   Sodium 132 (L) 135 - 145 mmol/L   Potassium 3.4 (L) 3.5 - 5.1 mmol/L   Chloride 100 (L) 101 - 111 mmol/L   CO2 25 22 - 32 mmol/L   Glucose, Bld 88 65 - 99 mg/dL   BUN 10 6 - 20 mg/dL   Creatinine, Ser 1.300.69 0.61 - 1.24 mg/dL   Calcium 7.6 (L) 8.9 - 10.3 mg/dL   Total Protein 5.3 (L) 6.5 - 8.1 g/dL   Albumin 1.9 (L) 3.5 - 5.0 g/dL   AST 44 (H) 15 - 41 U/L   ALT 44 17 - 63 U/L   Alkaline Phosphatase  89 38 - 126 U/L   Total Bilirubin 1.7 (H) 0.3 - 1.2 mg/dL   GFR calc non Af Amer >60 >60 mL/min   GFR calc Af Amer >60 >60 mL/min   Anion gap 7 5 - 15  CBC with Differential/Platelet     Status: Abnormal   Collection Time: 12/21/16  4:11 AM  Result Value Ref Range   WBC 22.8 (H) 4.0 - 10.5 K/uL   RBC 3.83 (L) 4.22 - 5.81 MIL/uL   Hemoglobin 12.7 (L) 13.0 - 17.0 g/dL   HCT 86.536.0 (L) 78.439.0 - 69.652.0 %   MCV 94.0 78.0 - 100.0 fL   MCH 33.2 26.0 - 34.0 pg   MCHC 35.3 30.0 - 36.0 g/dL   RDW 29.513.8 28.411.5 - 13.215.5 %   Platelets 303 150 - 400 K/uL   Neutrophils Relative % 90 %   Neutro Abs 20.6 (H) 1.7 - 7.7 K/uL   Lymphocytes Relative 3 %   Lymphs Abs 0.7 0.7 - 4.0 K/uL   Monocytes Relative 7 %   Monocytes Absolute 1.5 (H) 0.1 - 1.0 K/uL   Eosinophils Relative 0 %  Eosinophils Absolute 0.0 0.0 - 0.7 K/uL   Basophils Relative 0 %   Basophils Absolute 0.0 0.0 - 0.1 K/uL  Magnesium     Status: None   Collection Time: 12/21/16  4:11 AM  Result Value Ref Range   Magnesium 1.8 1.7 - 2.4 mg/dL  Phosphorus     Status: None   Collection Time: 12/21/16  4:11 AM  Result Value Ref Range   Phosphorus 3.5 2.5 - 4.6 mg/dL  Lipase, blood     Status: None   Collection Time: 12/21/16  4:11 AM  Result Value Ref Range   Lipase 33 11 - 51 U/L    Studies/Results: Ct Chest W Contrast  Result Date: 12/20/2016 CLINICAL DATA:  Leukocytosis. Laparoscopic cholecystectomy on 12/13/2016. EXAM: CT CHEST WITH CONTRAST TECHNIQUE: Multidetector CT imaging of the chest was performed during intravenous contrast administration. CONTRAST:  75mL ISOVUE-300 IOPAMIDOL (ISOVUE-300) INJECTION 61% COMPARISON:  Plain films 12/11/2016. abdominopelvic CT of 12/17/2016. FINDINGS: Cardiovascular: Tortuous thoracic aorta. Mild cardiomegaly, without pericardial effusion. Lad coronary artery atherosclerosis. Mediastinum/Nodes: No mediastinal or hilar adenopathy. Lungs/Pleura: Small bilateral pleural effusions. Mild motion degradation  throughout. Collapse/consolidative change in both lung bases. Mild patchy ground-glass opacities involving both upper lobes. Upper Abdomen: Normal imaged portions of the liver, stomach. Hypoattenuating posterior splenic low-density lesion is incompletely imaged and grossly similar to the prior to dedicated CT. Upper abdominal ascites and peripancreatic fluid are incompletely imaged. Musculoskeletal: Right hemidiaphragm elevation. No acute osseous abnormality. IMPRESSION: 1. Mildly motion degraded exam. 2. Small bilateral pleural effusions. Bibasilar collapse/ consolidative. suspicious for pneumonia. Patchy upper lobe ground-glass opacities are also favored to represent infection. These could represent subsegmental atelectasis. 3. Cardiomegaly with LAD coronary artery atherosclerosis. 4. Incompletely imaged upper abdominal findings, as on the dedicated CT of 12/17/2016. Electronically Signed   By: Jeronimo Greaves M.D.   On: 12/20/2016 11:06   Dg Swallowing Func-speech Pathology  Result Date: 12/21/2016 Objective Swallowing Evaluation: Type of Study: MBS-Modified Barium Swallow Study Patient Details Name: [name] MRN: [id number] Date of Birth: 10/20/1955 Today's Date: 12/21/2016 Time: SLP Start Time (ACUTE ONLY): 0835-SLP Stop Time (ACUTE ONLY): 0850 SLP Time Calculation (min) (ACUTE ONLY): 15 min Past Medical History: Past Medical History: Diagnosis Date . Anxiety  . Asthma   uses inhaler Past Surgical History: Past Surgical History: Procedure Laterality Date . CHOLECYSTECTOMY N/A 12/13/2016  Procedure: LAPAROSCOPIC CHOLECYSTECTOMY WITH INTRAOPERATIVE CHOLANGIOGRAM;  Surgeon: Chevis Pretty III, MD;  Location: [facility];  Service: General;  Laterality: N/A; . GUM SURGERY   HPI: 61 yo male adm to [facility] s/p emergent cholecystectomy one week ago.  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  MBS ordered.  Imaging studies 3/12  showed worsening pancreatitis and concern for pna.   Subjective: pt awake in chair Assessment / Plan / Recommendation CHL IP CLINICAL IMPRESSIONS 12/21/2016 Clinical Impression Pt presents swallow ability that is [facility].  No aspiration and only trace upper penetration cleared with further swallows.  Swallow is timely and strong.  If pt is having occasional aspiration, suspect may be due to minimal decreased timing of laryngeal closure - but this was not observed.  Provided pt with compensation strategies to attempt if he notes coughing with intake including tight chin tuck posture with liquids and drinking liquids with slightly more viscocity until improved.  Using live video and written tips, educated pt to findings/recommendations.  Barium tablet taken with thin easily  transited through pharynx and into esophagus.    Recommend when CCS deems indicated to advance diet to regular.  Of note, pt reports he does not desire any more procedures in the hospital and wanted this SLP to document as much.  If ENT indicates, pt may benefit from OP SLP to aid with voicing.   SLP Visit Diagnosis Dysphagia, pharyngeal phase (R13.13) Attention and concentration deficit following -- Frontal lobe and executive function deficit following -- Impact on safety and function Mild aspiration risk   No flowsheet data found.  No flowsheet data found. CHL IP DIET RECOMMENDATION 12/21/2016 SLP Diet Recommendations Regular solids;Thin liquid Liquid Administration via Cup;Straw Medication Administration Whole meds with liquid Compensations Slow rate;Small sips/bites Postural Changes Remain semi-upright after after feeds/meals (Comment);Seated upright at 90 degrees   CHL IP OTHER RECOMMENDATIONS 12/21/2016 Recommended Consults -- Oral Care Recommendations Oral care BID Other Recommendations --   No flowsheet data found.  No flowsheet data found.     CHL IP ORAL PHASE 12/21/2016 Oral Phase WFL Oral - Pudding Teaspoon -- Oral - Pudding Cup -- Oral - Honey  Teaspoon -- Oral - Honey Cup -- Oral - Nectar Teaspoon -- Oral - Nectar Cup WFL Oral - Nectar Straw -- Oral - Thin Teaspoon WFL Oral - Thin Cup WFL Oral - Thin Straw WFL Oral - Puree WFL Oral - Mech Soft WFL Oral - Regular -- Oral - Multi-Consistency WFL Oral - Pill WFL Oral Phase - Comment --  CHL IP PHARYNGEAL PHASE 12/21/2016 Pharyngeal Phase WFL Pharyngeal- Pudding Teaspoon -- Pharyngeal -- Pharyngeal- Pudding Cup -- Pharyngeal -- Pharyngeal- Honey Teaspoon -- Pharyngeal -- Pharyngeal- Honey Cup -- Pharyngeal -- Pharyngeal- Nectar Teaspoon -- Pharyngeal -- Pharyngeal- Nectar Cup WFL Pharyngeal -- Pharyngeal- Nectar Straw -- Pharyngeal -- Pharyngeal- Thin Teaspoon WFL Pharyngeal -- Pharyngeal- Thin Cup WFL;Penetration/Aspiration during swallow Pharyngeal Material enters airway, remains ABOVE vocal cords then ejected out Pharyngeal- Thin Straw WFL Pharyngeal -- Pharyngeal- Puree WFL Pharyngeal -- Pharyngeal- Mechanical Soft -- Pharyngeal -- Pharyngeal- Regular WFL Pharyngeal -- Pharyngeal- Multi-consistency -- Pharyngeal -- Pharyngeal- Pill WFL Pharyngeal -- Pharyngeal Comment --  CHL IP CERVICAL ESOPHAGEAL PHASE 12/21/2016 Cervical Esophageal Phase WFL Pudding Teaspoon -- Pudding Cup -- Honey Teaspoon -- Honey Cup -- Nectar Teaspoon -- Nectar Cup -- Nectar Straw -- Thin Teaspoon -- Thin Cup -- Thin Straw -- Puree -- Mechanical Soft -- Regular -- Multi-consistency -- Pill -- Cervical Esophageal Comment -- No flowsheet data found. Donavan Burnet, MS [facility] SLP [phone]                 Assessment: Gallstone pancreatitis  Plan:   Continue current management.    Gwenevere Abbot 12/21/2016, 10:39 AM  Pager: [phone] If no answer or after 5 PM call [phone]

## 2016-12-21 NOTE — Progress Notes (Signed)
Modified Barium Swallow Progress Note  Patient Details  Name: [name] MRN: [id number] Date of Birth: 1956-04-09  Today's Date: 12/21/2016  Modified Barium Swallow completed.  Full report located under Chart Review in the Imaging Section.  Brief recommendations include the following:  Clinical Impression  [facility].  No aspiration and only trace upper penetration of thin x1 cleared with further swallows.  Swallow is timely and strong without residuals.    If pt is having occasional aspiration, suspect may be due to minimal decreased timing of laryngeal closure - but this was not observed.  Provided pt with compensation strategies to attempt if he notes coughing with intake including tight chin tuck posture with liquids and drinking liquids with slightly more viscocity until improved.   Barium tablet taken with thin easily transited through pharynx and into esophagus.      Recommend when CCS deems indicated to advance diet to regular.  Using live video and written tips, educated pt to findings/recommendations.   Of note, pt reports he does not desire any more procedures in the hospital and wanted this SLP to document as much.    If ENT indicates, pt may benefit from OP SLP to aid with voicing/strengthen vocal fold adduction.     Swallow Evaluation Recommendations       SLP Diet Recommendations: Regular solids;Thin liquid   Liquid Administration via: Cup;Straw   Medication Administration: Whole meds with liquid   Supervision: Patient able to self feed   Compensations: Slow rate;Small sips/bites   Postural Changes: Remain semi-upright after after feeds/meals (Comment);Seated upright at 90 degrees   Oral Care Recommendations: Oral care BID       [name], MS [facility] SLP [phone]

## 2016-12-21 NOTE — Progress Notes (Signed)
New order for swallow evaluation received, spoke to RN - pt had MBS today - please see progress note from [phone] for information.  Functional oropharyngeal swallow.  Thanks. Donavan Burnetamara Walker Sitar, MS [facility] SLP [phone]

## 2016-12-21 NOTE — Progress Notes (Signed)
PROGRESS NOTE  [name] UJW:[id number] DOB: 06-03-1956 DOA: 12/11/2016 PCP: Sissy Hoff, MD  Brief History:   61 y.o.malewith PMH of asthma, anxiety, physically active (ran 4 times per week and lifted weights), was referred by PCP due to nausea, vomiting and abdominal pain. He was diagnosed with acute biliary pancreatitis. General surgery was consulted and he underwent laparoscopic cholecystectomy with intraoperative cholangiogram and placement of drain on 12/13/16. Diet was gradually advanced which she has tolerated. Having BMs. Due to worsening leukocytosis up to 27.5K, primary service obtained repeat CT abdomen and pelvis on 12/17/16 which shows worsening pancreatitis without evidence of necrosis or pseudocyst formation and no definitive abscess, scattered ascites, small bilateral pleural effusions with collapse/consolidation in both lower lobes, cannot exclude pneumonia per report. Patient was made NPOand TRH was requested to reassess.  Assessment/Plan: Leukocytosis: -afebrile and hemodynamically stable -suspect main driver is worsening of his pancreatitis -No respiratory symptoms suggestive of pneumonia. Clinical and radiological findings likely related to atelectasis/pleural effusion.  -Already had 8 days Zosyn-->d/c 3/15 -Check pro calcitonin 1.24>>>0.79.>>0.55 -continues to have 3 watery stools daily-->check Cdiff--NEG -12/20/16 CT chest--small bilateral pleural effusions with bibasilar collapse and scattered upper lobe air space disease -blood culture x 2--neg -check UA--negative for pyuria -discontinue zosyn -consult pulmonary-->air space disease on CT not clinical indicative of pneumonia -WBC trending down  Acute biliary pancreatitis: -Status post laparoscopic cholecystectomy, IOC &drain placement on 12/13/16.  -pain controlled, tolerating diet, having BMs, no fevers, lipase normalized and improved LFTs.  -Has been on IV Zosyn 12/13/16>>12/20/16.  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 Medialization laryngoplasty can help the voice and swallowing if necessary. -appreciate speech therapy eval-->mild pharyngeal phase dysphagia--can tolerate regular diet with thin liquid  Lower extremity pain and edema -venous duplex--neg  Hypokalemia:  -Replace and follow. -check mag--1.9  Acute kidney injury:  -Resolved.   Anxiety: -Stable -continue klonopin and zoloft  Asthma:  -Stable without clinical bronchospasm.   Disposition Plan: Home 1-2 days if stable and WBC trends down Family Communication: Spouse updatedat bedside-   Consultants: pulmonary; [facility]  Code Status: FULL  DVT Prophylaxis: [place] Lovenox   Procedures: As Listed in Progress Note Above  Antibiotics: Zosyn 3/8>>>12/20/16   Subjective: Patient denies fevers, chills, headache, chest pain, dyspnea, nausea, vomiting, diarrhea, abdominal pain, dysuria, hematuria, hematochezia, and melena.   Objective: Vitals:   12/20/16 1915 12/20/16 2037 12/21/16 0556 12/21/16 1356  BP:  (!) 146/77 138/76 131/78  Pulse: 63 65 67 64  Resp: 18 20 20    Temp:  98.5 F (36.9 C) 98.8 F (37.1 C) 98.8 F (37.1 C)  TempSrc:  Oral Oral Oral  SpO2: 94% 96% 97% 97%  Weight:   100.7 kg (222 lb 0.1 oz)   Height:        Intake/Output Summary (Last 24 hours) at 12/21/16 1543 Last data filed at 12/21/16 1540  Gross per 24 hour  Intake             1780 ml  Output             1825 ml  Net              -  45 ml   Weight change: 0.682 kg (1 lb 8.1 oz) Exam:   General:  Pt is alert, follows commands  appropriately, not in acute distress  HEENT: No icterus, No thrush, No neck mass, [place]/AT  Cardiovascular: RRR, S1/S2, no rubs, no gallops  Respiratory: Bibasilar crackles but no wheezing. Good air movement  Abdomen: Soft/+BS, non tender, non distended, no guarding  Extremities: 1 + LE edema, No lymphangitis, No petechiae, No rashes, no synovitis   Data Reviewed: I have personally reviewed following labs and imaging studies Basic Metabolic Panel:  Recent Labs Lab 12/16/16 0405 12/18/16 0443 12/19/16 0400 12/20/16 0432 12/21/16 0411  NA 135 135 134* 133* 132*  K 3.2* 2.9* 3.4* 3.8 3.4*  CL 103 101 101 104 100*  CO2 26 27 27 23 25   GLUCOSE 103* 118* 117* 119* 88  BUN 19 14 13 11 10   CREATININE 0.74 0.69 0.67 0.57* 0.69  CALCIUM 7.6* 7.6* 7.3* 7.5* 7.6*  MG  --  1.9  --  1.9 1.8  PHOS  --   --   --   --  3.5   Liver Function Tests:  Recent Labs Lab 12/16/16 0405 12/18/16 0443 12/21/16 0411  AST 28 26 44*  ALT 26 24 44  ALKPHOS 60 67 89  BILITOT 1.8* 2.0* 1.7*  PROT 4.6* 4.8* 5.3*  ALBUMIN 2.0* 1.9* 1.9*    Recent Labs Lab 12/16/16 0405 12/18/16 0443 12/21/16 0411  LIPASE 22 36 33   No results for input(s): AMMONIA in the last 168 hours. Coagulation Profile: No results for input(s): INR, PROTIME in the last 168 hours. CBC:  Recent Labs Lab 12/16/16 0405 12/17/16 0541 12/18/16 0443 12/19/16 0400 12/20/16 0432 12/21/16 0411  WBC 17.2* 25.5* 27.5* 24.0* 24.5* 22.8*  NEUTROABS 15.1* 23.5*  --   --  22.6* 20.6*  HGB 11.3* 11.3* 12.2* 11.5* 11.3* 12.7*  HCT 32.4* 32.8* 34.5* 33.8* 33.1* 36.0*  MCV 93.9 91.9 92.5 92.1 92.7 94.0  PLT 149* 174 197 221 239 303   Cardiac Enzymes: No results for input(s): CKTOTAL, CKMB, CKMBINDEX, TROPONINI in the last 168 hours. BNP: Invalid input(s): POCBNP CBG: No results for input(s): GLUCAP in the last 168 hours. HbA1C: No results for input(s): HGBA1C in the last 72 hours. Urine analysis:    Component Value  Date/Time   COLORURINE AMBER (A) 12/19/2016 2004   APPEARANCEUR CLEAR 12/19/2016 2004   LABSPEC 1.020 12/19/2016 2004   PHURINE 7.0 12/19/2016 2004   GLUCOSEU NEGATIVE 12/19/2016 2004   HGBUR SMALL (A) 12/19/2016 2004   BILIRUBINUR NEGATIVE 12/19/2016 2004   KETONESUR NEGATIVE 12/19/2016 2004   PROTEINUR NEGATIVE 12/19/2016 2004   NITRITE NEGATIVE 12/19/2016 2004   LEUKOCYTESUR NEGATIVE 12/19/2016 2004   Sepsis Labs: @LABRCNTIP (procalcitonin:4,lacticidven:4) ) Recent Results (from the past 240 hour(s))  Culture, blood (Routine X 2) w Reflex to ID Panel     Status: None   Collection Time: 12/11/16  7:42 PM  Result Value Ref Range Status   Specimen Description BLOOD LEFT ANTECUBITAL  Final   Special Requests BOTTLES DRAWN AEROBIC AND ANAEROBIC 5CC  Final   Culture   Final    NO GROWTH 5 DAYS Performed at [facility], [address] [place], [place] [address]    Report Status 12/17/2016 FINAL  Final  Culture, blood (Routine X 2) w Reflex to ID Panel     Status: None   Collection Time: 12/11/16  7:42 PM  Result Value Ref Range Status  Specimen Description BLOOD LEFT ANTECUBITAL  Final   Special Requests BOTTLES DRAWN AEROBIC AND ANAEROBIC 5CC  Final   Culture   Final    NO GROWTH 5 DAYS Performed at [facility], [address] [place], [place] [address]    Report Status 12/17/2016 FINAL  Final  Surgical PCR screen     Status: Abnormal   Collection Time: 12/13/16 10:55 AM  Result Value Ref Range Status   MRSA, PCR NEGATIVE NEGATIVE Final   Staphylococcus aureus POSITIVE (A) NEGATIVE Final    Comment:        The Xpert SA Assay (FDA approved for NASAL specimens in patients over 63 years of age), is one component of a comprehensive surveillance program.  Test performance has been validated by [facility] for patients greater than or equal to 862 year old. It is not intended to diagnose infection nor to guide or monitor treatment.   Culture, Urine      Status: None   Collection Time: 12/19/16  8:04 PM  Result Value Ref Range Status   Specimen Description URINE, RANDOM  Final   Special Requests NONE  Final   Culture   Final    NO GROWTH Performed at [facility], [address] [place], [place] [address]    Report Status 12/21/2016 FINAL  Final  Culture, blood (Routine X 2) w Reflex to ID Panel     Status: None (Preliminary result)   Collection Time: 12/20/16  9:31 AM  Result Value Ref Range Status   Specimen Description BLOOD RIGHT ARM  Final   Special Requests BOTTLES DRAWN AEROBIC AND ANAEROBIC 10CC  Final   Culture   Final    NO GROWTH 1 DAY Performed at [facility], [address] [place], [place] [address]    Report Status PENDING  Incomplete  Culture, blood (Routine X 2) w Reflex to ID Panel     Status: None (Preliminary result)   Collection Time: 12/20/16  9:36 AM  Result Value Ref Range Status   Specimen Description BLOOD RIGHT HAND  Final   Special Requests IN PEDIATRIC BOTTLE 2CC  Final   Culture   Final    NO GROWTH 1 DAY Performed at [facility], [address] [place], [place] [address]    Report Status PENDING  Incomplete  C difficile quick scan w PCR reflex     Status: None   Collection Time: 12/20/16 12:31 PM  Result Value Ref Range Status   C Diff antigen NEGATIVE NEGATIVE Final   C Diff toxin NEGATIVE NEGATIVE Final   C Diff interpretation No C. difficile detected.  Final     Scheduled Meds: . clonazePAM  1.5 mg Oral QHS  . enoxaparin (LOVENOX) injection  40 mg Subcutaneous Q24H  . fluconazole (DIFLUCAN) IV  100 mg Intravenous Q24H  . mometasone-formoterol  2 puff Inhalation BID  . montelukast  10 mg Oral QHS  . nystatin  5 mL Oral QID  . protein supplement  8 oz Oral TID  . sertraline  200 mg Oral QHS   Continuous Infusions: . dextrose 5 % and 0.9 % NaCl with KCl 40 mEq/L 100 mL/hr at 12/21/16 1502    Procedures/Studies: Ct Abdomen Pelvis Wo Contrast  Result Date:  12/11/2016 CLINICAL DATA:  Abdominal pain since yesterday. EXAM: CT ABDOMEN AND PELVIS WITHOUT CONTRAST TECHNIQUE: Multidetector CT imaging of the abdomen and pelvis was performed following the standard protocol without IV  contrast. COMPARISON:  None. FINDINGS: Lower chest: Small bilateral pleural effusions and bibasilar atelectasis. The heart is mildly enlarged. Coronary artery calcifications are noted. The distal esophagus is grossly normal. Hepatobiliary: No focal hepatic lesions or intrahepatic biliary dilatation. There are gallstones and sludge noted in the gallbladder. No common bile duct dilatation. Pancreas: The pancreas is enlarged and diffusely inflamed. There is also extensive peripancreatic inflammation/phlegmon. Small fluid collections likely represent developing pseudocysts. No pancreatic mass. No pancreatic duct dilatation. No obvious obstructing common bile duct stone. There is a small duodenal diverticulum noted at near the pancreatic head. Spleen: Normal size. Simple appearing cysts noted. Peripancreatic fluid. Adrenals/Urinary Tract: The adrenal glands and kidneys are unremarkable. No renal, ureteral or bladder calculi or mass. Moderate fluid and inflammation noted in the anterior pararenal spaces, left greater than right and also fluid tracking back along the pericolic gutters. Stomach/Bowel: The stomach, duodenum, small bowel and colon are unremarkable. No inflammatory changes, mass lesions or obstructive findings. The terminal ileum is normal. The appendix is normal. Vascular/Lymphatic: No significant aortic calcifications or aneurysm. Small scattered mesenteric and retroperitoneal lymph nodes but no mass or adenopathy. Reproductive: The prostate gland and seminal vesicles are unremarkable. Other: Small to moderate amount of free pelvic fluid. No inguinal mass or adenopathy. Musculoskeletal: No significant bony findings. IMPRESSION: CT findings consistent with acute pancreatitis as discussed  above. Gallstones and gallbladder sludge are noted. No obvious common bile duct stone. Small bilateral pleural effusions and bibasilar atelectasis. Electronically Signed   By: Rudie Meyer M.D.   On: 12/11/2016 16:11   Dg Chest 2 View  Result Date: 12/11/2016 CLINICAL DATA:  Shortness of Breath EXAM: CHEST  2 VIEW COMPARISON:  None. FINDINGS: Low lung volumes with bibasilar atelectasis or infiltrates. Mild cardiomegaly. No overt edema. No effusions or acute bony abnormality. IMPRESSION: Mild cardiomegaly.  Bibasilar atelectasis or infiltrates. Electronically Signed   By: Charlett Nose M.D.   On: 12/11/2016 14:04   Dg Cholangiogram Operative  Result Date: 12/13/2016 CLINICAL DATA:  61 year old male with a history of cholecystitis EXAM: INTRAOPERATIVE CHOLANGIOGRAM TECHNIQUE: Cholangiographic images from the C-arm fluoroscopic device were submitted for interpretation post-operatively. Please see the procedural report for the amount of contrast and the fluoroscopy time utilized. COMPARISON:  Ultrasound 12/11/2016 FINDINGS: Surgical instruments project over the upper abdomen. There is cannulation of the cystic duct/gallbladder neck, with antegrade infusion of contrast. Caliber of the extrahepatic ductal system within normal limits. No large filling defect identified. Free flow of contrast across the ampulla. IMPRESSION: Intraoperative cholangiogram demonstrates extrahepatic biliary ducts of unremarkable caliber, with no large filling defect identified. Free flow of contrast across the ampulla. Please refer to the dictated operative report for full details of intraoperative findings and procedure Electronically Signed   By: Gilmer Mor D.O.   On: 12/13/2016 16:31   Ct Chest W Contrast  Result Date: 12/20/2016 CLINICAL DATA:  Leukocytosis. Laparoscopic cholecystectomy on 12/13/2016. EXAM: CT CHEST WITH CONTRAST TECHNIQUE: Multidetector CT imaging of the chest was performed during intravenous contrast  administration. CONTRAST:  75mL ISOVUE-300 IOPAMIDOL (ISOVUE-300) INJECTION 61% COMPARISON:  Plain films 12/11/2016. abdominopelvic CT of 12/17/2016. FINDINGS: Cardiovascular: Tortuous thoracic aorta. Mild cardiomegaly, without pericardial effusion. Lad coronary artery atherosclerosis. Mediastinum/Nodes: No mediastinal or hilar adenopathy. Lungs/Pleura: Small bilateral pleural effusions. Mild motion degradation throughout. Collapse/consolidative change in both lung bases. Mild patchy ground-glass opacities involving both upper lobes. Upper Abdomen: Normal imaged portions of the liver, stomach. Hypoattenuating posterior splenic low-density lesion is incompletely imaged and grossly similar to the  prior to dedicated CT. Upper abdominal ascites and peripancreatic fluid are incompletely imaged. Musculoskeletal: Right hemidiaphragm elevation. No acute osseous abnormality. IMPRESSION: 1. Mildly motion degraded exam. 2. Small bilateral pleural effusions. Bibasilar collapse/ consolidative. suspicious for pneumonia. Patchy upper lobe ground-glass opacities are also favored to represent infection. These could represent subsegmental atelectasis. 3. Cardiomegaly with LAD coronary artery atherosclerosis. 4. Incompletely imaged upper abdominal findings, as on the dedicated CT of 12/17/2016. Electronically Signed   By: Jeronimo Greaves M.D.   On: 12/20/2016 11:06   Ct Abdomen Pelvis W Contrast  Result Date: 12/17/2016 CLINICAL DATA:  Laparoscopic cholecystectomy 4 days ago with drain placement, biliary pancreatitis, rising white blood cell count. EXAM: CT ABDOMEN AND PELVIS WITH CONTRAST TECHNIQUE: Multidetector CT imaging of the abdomen and pelvis was performed using the standard protocol following bolus administration of intravenous contrast. CONTRAST:  ISOVUE-300 IOPAMIDOL (ISOVUE-300) INJECTION 61%, 15mL ISOVUE-300 IOPAMIDOL (ISOVUE-300) INJECTION 61% COMPARISON:  12/11/2016. FINDINGS: Lower chest: Small bilateral pleural  effusions with collapse/ consolidation in both lower lobes, left greater than right. Heart is enlarged. No pericardial effusion. Hepatobiliary: The liver is unremarkable. Cholecystectomy. No biliary ductal dilatation. Pancreas: Extensive inflammatory stranding and fluid surrounds the pancreas, increased from 12/11/2016. Pancreas appears uniform in attenuation. Spleen: 2.9 cm low-attenuation lesion in the medial spleen is unchanged. Adrenals/Urinary Tract: Adrenal glands and kidneys are unremarkable. Ureters are decompressed. Bladder is grossly unremarkable. Stomach/Bowel: Stomach, small bowel, appendix and colon are unremarkable. Vascular/Lymphatic: Minimal atherosclerotic calcification of the arterial vasculature. Peripancreatic lymph node measures 14 mm. Reproductive: Prostate is visualized. Other: Mild presacral edema. Bilateral inguinal hernias contain fat. Scattered ascites. Extensive peripancreatic fluid and stranding. Percutaneous perihepatic drain in place. Musculoskeletal: No worrisome lytic or sclerotic lesions. Subtotal fusion of the sacroiliac joints. Degenerative changes are seen in the spine. IMPRESSION: 1. Worsening pancreatitis. No evidence of necrosis or pseudocyst formation. No definite abscess. 2. Scattered ascites. 3. Small bilateral pleural effusions with collapse/consolidation in both lower lobes, cannot exclude pneumonia. Electronically Signed   By: Leanna Battles M.D.   On: 12/17/2016 12:47   Dg Swallowing Func-speech Pathology  Result Date: 12/21/2016 Objective Swallowing Evaluation: Type of Study: MBS-Modified Barium Swallow Study Patient Details Name: CODYLEE PATIL MRN: [id number] Date of Birth: [birth year] Today's Date: 12/21/2016 Time: SLP Start Time (ACUTE ONLY): 0835-SLP Stop Time (ACUTE ONLY): 0850 SLP Time Calculation (min) (ACUTE ONLY): 15 min Past Medical History: Past Medical History: Diagnosis Date . Anxiety  . Asthma   uses inhaler Past Surgical History: Past Surgical History:  Procedure Laterality Date . CHOLECYSTECTOMY N/A 12/13/2016  Procedure: LAPAROSCOPIC CHOLECYSTECTOMY WITH INTRAOPERATIVE CHOLANGIOGRAM;  Surgeon: Chevis Pretty III, MD;  Location: [facility];  Service: General;  Laterality: N/A; . GUM SURGERY   HPI: 61 yo male adm to [facility] s/p emergent cholecystectomy one week ago.  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  MBS ordered.  Imaging studies 3/12 showed worsening pancreatitis and concern for pna.   Subjective: pt awake in chair Assessment / Plan / Recommendation CHL IP CLINICAL IMPRESSIONS 12/21/2016 Clinical Impression Pt presents swallow ability that is [facility].  No aspiration and only trace upper penetration cleared with further swallows.  Swallow is timely and strong.  If pt is having occasional aspiration, suspect may be due to minimal decreased timing of laryngeal closure - but this was not observed.  Provided pt with compensation strategies to attempt if he notes coughing with intake  including tight chin tuck posture with liquids and drinking liquids with slightly more viscocity until improved.  Using live video and written tips, educated pt to findings/recommendations.  Barium tablet taken with thin easily transited through pharynx and into esophagus.    Recommend when [facility] deems indicated to advance diet to regular.  Of note, pt reports he does not desire any more procedures in the hospital and wanted this SLP to document as much.  If ENT indicates, pt may benefit from OP SLP to aid with voicing.   SLP Visit Diagnosis Dysphagia, pharyngeal phase (R13.13) Attention and concentration deficit following -- Frontal lobe and executive function deficit following -- Impact on safety and function Mild aspiration risk   No flowsheet data found.  No flowsheet data found. CHL IP DIET RECOMMENDATION 12/21/2016 SLP Diet Recommendations Regular solids;Thin liquid Liquid Administration via  Cup;Straw Medication Administration Whole meds with liquid Compensations Slow rate;Small sips/bites Postural Changes Remain semi-upright after after feeds/meals (Comment);Seated upright at 90 degrees   CHL IP OTHER RECOMMENDATIONS 12/21/2016 Recommended Consults -- Oral Care Recommendations Oral care BID Other Recommendations --   No flowsheet data found.  No flowsheet data found.     CHL IP ORAL PHASE 12/21/2016 Oral Phase WFL Oral - Pudding Teaspoon -- Oral - Pudding Cup -- Oral - Honey Teaspoon -- Oral - Honey Cup -- Oral - Nectar Teaspoon -- Oral - Nectar Cup WFL Oral - Nectar Straw -- Oral - Thin Teaspoon WFL Oral - Thin Cup WFL Oral - Thin Straw WFL Oral - Puree WFL Oral - Mech Soft WFL Oral - Regular -- Oral - Multi-Consistency WFL Oral - Pill WFL Oral Phase - Comment --  CHL IP PHARYNGEAL PHASE 12/21/2016 Pharyngeal Phase WFL Pharyngeal- Pudding Teaspoon -- Pharyngeal -- Pharyngeal- Pudding Cup -- Pharyngeal -- Pharyngeal- Honey Teaspoon -- Pharyngeal -- Pharyngeal- Honey Cup -- Pharyngeal -- Pharyngeal- Nectar Teaspoon -- Pharyngeal -- Pharyngeal- Nectar Cup WFL Pharyngeal -- Pharyngeal- Nectar Straw -- Pharyngeal -- Pharyngeal- Thin Teaspoon WFL Pharyngeal -- Pharyngeal- Thin Cup WFL;Penetration/Aspiration during swallow Pharyngeal Material enters airway, remains ABOVE vocal cords then ejected out Pharyngeal- Thin Straw WFL Pharyngeal -- Pharyngeal- Puree WFL Pharyngeal -- Pharyngeal- Mechanical Soft -- Pharyngeal -- Pharyngeal- Regular WFL Pharyngeal -- Pharyngeal- Multi-consistency -- Pharyngeal -- Pharyngeal- Pill WFL Pharyngeal -- Pharyngeal Comment --  CHL IP CERVICAL ESOPHAGEAL PHASE 12/21/2016 Cervical Esophageal Phase WFL Pudding Teaspoon -- Pudding Cup -- Honey Teaspoon -- Honey Cup -- Nectar Teaspoon -- Nectar Cup -- Nectar Straw -- Thin Teaspoon -- Thin Cup -- Thin Straw -- Puree -- Mechanical Soft -- Regular -- Multi-consistency -- Pill -- Cervical Esophageal Comment -- No flowsheet data found.  Donavan Burnet, MS [facility] SLP [phone]              US Abdomen Limited Ruq  Result Date: 12/11/2016 CLINICAL DATA:  Acute pancreatitis EXAM: US ABDOMEN LIMITED - RIGHT UPPER QUADRANT COMPARISON:  CT abdomen pelvis 12/11/2016 FINDINGS: Gallbladder: Multiple gallstones measuring up to 8 mm in diameter. Gallbladder wall not significantly thickened. Positive sonographic Murphy sign. Common bile duct: Diameter: 4.5 mm Liver: No focal lesion identified. Within normal limits in parenchymal echogenicity. Small amount of fluid around the liver IMPRESSION: Cholelithiasis. Patient tender over the gallbladder compatible with positive sonographic Murphy sign. No biliary dilatation. Small amount of free fluid around the liver. Electronically Signed   By: Marlan Palau M.D.   On: 12/11/2016 17:29    Goku Harb, Jonovan, DO  [facility] Pager [phone]  If 7PM-7AM,  please contact night-coverage [email or web address] Password [email or web address] 12/21/2016, 3:43 PM   LOS: 10 days

## 2016-12-21 NOTE — Progress Notes (Signed)
General Surgery [facility]  Assessment & Plan:  Gallstone pancreatitis  LAP CHOLE WITH IOC, 12/13/16, Dr. Carolynne Edouard POD #8  Post op pancreatitis  Oral candidiasis  Limited liquid diet at present Voice loss/hoarseness  ENT evaluation appreciated  Swallow study pending Post op atelectasis/pleural effusions - ?pneumonia  Abx stopped per pulmonary medicine Acute kidney injury  Resolved ID  Zosyn day 9; Diflucan day 4; Nystatin day 4 DVT prophylaxis  Lovenox  Venous duplex negative for DVT        Velora Heckler, MD, [facility]       Office: [phone]    Subjective: Patient in bed, family at bedside.  No specific complaints.  Taking little po liquids - awaiting swallow study.  Objective: Vital signs in last 24 hours: Temp:  [98.5 F (36.9 C)-98.8 F (37.1 C)] 98.8 F (37.1 C) (03/16 0556) Pulse Rate:  [63-67] 67 (03/16 0556) Resp:  [18-20] 20 (03/16 0556) BP: (138-146)/(76-77) 138/76 (03/16 0556) SpO2:  [94 %-97 %] 97 % (03/16 0556) Weight:  [100.7 kg (222 lb 0.1 oz)] 100.7 kg (222 lb 0.1 oz) (03/16 0556) Last BM Date: 12/20/16  Intake/Output from previous day: 03/15 0701 - 03/16 0700 In: 1300 [I.V.:300] Out: 1350 [Urine:1350] Intake/Output this shift: Total I/O In: -  Out: 425 [Urine:425]  Physical Exam: HEENT - sclerae clear, mucous membranes moist Neck - voice weak, breathy Abdomen - softer, mild distension; BS present; mild epigastric tenderness; drain site dressing dry Ext - no edema, non-tender Neuro - alert & oriented, no focal deficits  Lab Results:   Recent Labs  12/20/16 0432 12/21/16 0411  WBC 24.5* 22.8*  HGB 11.3* 12.7*  HCT 33.1* 36.0*  PLT 239 303   BMET  Recent Labs  12/20/16 0432 12/21/16 0411  NA 133* 132*  K 3.8 3.4*  CL 104 100*  CO2 23 25  GLUCOSE 119* 88  BUN 11 10  CREATININE 0.57* 0.69  CALCIUM 7.5* 7.6*   PT/INR No results for input(s): LABPROT, INR in the  last 72 hours. Comprehensive Metabolic Panel:    Component Value Date/Time   NA 132 (L) 12/21/2016 0411   NA 133 (L) 12/20/2016 0432   K 3.4 (L) 12/21/2016 0411   K 3.8 12/20/2016 0432   CL 100 (L) 12/21/2016 0411   CL 104 12/20/2016 0432   CO2 25 12/21/2016 0411   CO2 23 12/20/2016 0432   BUN 10 12/21/2016 0411   BUN 11 12/20/2016 0432   CREATININE 0.69 12/21/2016 0411   CREATININE 0.57 (L) 12/20/2016 0432   GLUCOSE 88 12/21/2016 0411   GLUCOSE 119 (H) 12/20/2016 0432   CALCIUM 7.6 (L) 12/21/2016 0411   CALCIUM 7.5 (L) 12/20/2016 0432   AST 44 (H) 12/21/2016 0411   AST 26 12/18/2016 0443   ALT 44 12/21/2016 0411   ALT 24 12/18/2016 0443   ALKPHOS 89 12/21/2016 0411   ALKPHOS 67 12/18/2016 0443   BILITOT 1.7 (H) 12/21/2016 0411   BILITOT 2.0 (H) 12/18/2016 0443   PROT 5.3 (L) 12/21/2016 0411   PROT 4.8 (L) 12/18/2016 0443   ALBUMIN 1.9 (L) 12/21/2016 0411   ALBUMIN 1.9 (L) 12/18/2016 0443    Studies/Results: Ct Chest W Contrast  Result Date: 12/20/2016 CLINICAL DATA:  Leukocytosis. Laparoscopic cholecystectomy on 12/13/2016. EXAM: CT CHEST WITH CONTRAST TECHNIQUE: Multidetector CT imaging of the chest was performed during intravenous contrast administration. CONTRAST:  75mL ISOVUE-300 IOPAMIDOL (ISOVUE-300) INJECTION  61% COMPARISON:  Plain films 12/11/2016. abdominopelvic CT of 12/17/2016. FINDINGS: Cardiovascular: Tortuous thoracic aorta. Mild cardiomegaly, without pericardial effusion. Lad coronary artery atherosclerosis. Mediastinum/Nodes: No mediastinal or hilar adenopathy. Lungs/Pleura: Small bilateral pleural effusions. Mild motion degradation throughout. Collapse/consolidative change in both lung bases. Mild patchy ground-glass opacities involving both upper lobes. Upper Abdomen: Normal imaged portions of the liver, stomach. Hypoattenuating posterior splenic low-density lesion is incompletely imaged and grossly similar to the prior to dedicated CT. Upper abdominal ascites  and peripancreatic fluid are incompletely imaged. Musculoskeletal: Right hemidiaphragm elevation. No acute osseous abnormality. IMPRESSION: 1. Mildly motion degraded exam. 2. Small bilateral pleural effusions. Bibasilar collapse/ consolidative. suspicious for pneumonia. Patchy upper lobe ground-glass opacities are also favored to represent infection. These could represent subsegmental atelectasis. 3. Cardiomegaly with LAD coronary artery atherosclerosis. 4. Incompletely imaged upper abdominal findings, as on the dedicated CT of 12/17/2016. Electronically Signed   By: Jeronimo GreavesKyle  Talbot M.D.   On: 12/20/2016 11:06      Jasmeet Gehl Judie PetitM 12/21/2016

## 2016-12-22 ENCOUNTER — Inpatient Hospital Stay (HOSPITAL_COMMUNITY): Payer: BC Managed Care – PPO

## 2016-12-22 DIAGNOSIS — K851 Biliary acute pancreatitis without necrosis or infection: Secondary | ICD-10-CM

## 2016-12-22 DIAGNOSIS — J90 Pleural effusion, not elsewhere classified: Secondary | ICD-10-CM

## 2016-12-22 DIAGNOSIS — E876 Hypokalemia: Secondary | ICD-10-CM

## 2016-12-22 DIAGNOSIS — N179 Acute kidney failure, unspecified: Secondary | ICD-10-CM

## 2016-12-22 DIAGNOSIS — E871 Hypo-osmolality and hyponatremia: Secondary | ICD-10-CM

## 2016-12-22 DIAGNOSIS — D72823 Leukemoid reaction: Secondary | ICD-10-CM

## 2016-12-22 LAB — CBC
HCT: 33.4 % — ABNORMAL LOW (ref 39.0–52.0)
HEMOGLOBIN: 11.7 g/dL — AB (ref 13.0–17.0)
MCH: 32.9 pg (ref 26.0–34.0)
MCHC: 35 g/dL (ref 30.0–36.0)
MCV: 93.8 fL (ref 78.0–100.0)
Platelets: 313 10*3/uL (ref 150–400)
RBC: 3.56 MIL/uL — AB (ref 4.22–5.81)
RDW: 13.6 % (ref 11.5–15.5)
WBC: 21.1 10*3/uL — ABNORMAL HIGH (ref 4.0–10.5)

## 2016-12-22 LAB — COMPREHENSIVE METABOLIC PANEL
ALT: 37 U/L (ref 17–63)
ANION GAP: 7 (ref 5–15)
AST: 29 U/L (ref 15–41)
Albumin: 1.8 g/dL — ABNORMAL LOW (ref 3.5–5.0)
Alkaline Phosphatase: 96 U/L (ref 38–126)
BUN: 9 mg/dL (ref 6–20)
CALCIUM: 7.5 mg/dL — AB (ref 8.9–10.3)
CHLORIDE: 98 mmol/L — AB (ref 101–111)
CO2: 26 mmol/L (ref 22–32)
Creatinine, Ser: 0.67 mg/dL (ref 0.61–1.24)
GFR calc non Af Amer: >60 mL/min (ref >60)
Glucose, Bld: 111 mg/dL — ABNORMAL HIGH (ref 65–99)
Potassium: 3.6 mmol/L (ref 3.5–5.1)
SODIUM: 131 mmol/L — AB (ref 135–145)
Total Bilirubin: 1.4 mg/dL — ABNORMAL HIGH (ref 0.3–1.2)
Total Protein: 4.9 g/dL — ABNORMAL LOW (ref 6.5–8.1)

## 2016-12-22 LAB — GASTROINTESTINAL PANEL BY PCR, STOOL (REPLACES STOOL CULTURE)
ASTROVIRUS: NOT DETECTED
Adenovirus F40/41: NOT DETECTED
CRYPTOSPORIDIUM: NOT DETECTED
CYCLOSPORA CAYETANENSIS: NOT DETECTED
Campylobacter species: NOT DETECTED
ENTEROPATHOGENIC E COLI (EPEC): NOT DETECTED
Entamoeba histolytica: NOT DETECTED
Enteroaggregative E coli (EAEC): NOT DETECTED
Enterotoxigenic E coli (ETEC): NOT DETECTED
Giardia lamblia: NOT DETECTED
Norovirus GI/GII: NOT DETECTED
Plesimonas shigelloides: NOT DETECTED
Rotavirus A: NOT DETECTED
SALMONELLA SPECIES: NOT DETECTED
SAPOVIRUS (I, II, IV, AND V): NOT DETECTED
SHIGA LIKE TOXIN PRODUCING E COLI (STEC): NOT DETECTED
SHIGELLA/ENTEROINVASIVE E COLI (EIEC): NOT DETECTED
VIBRIO SPECIES: NOT DETECTED
Vibrio cholerae: NOT DETECTED
YERSINIA ENTEROCOLITICA: NOT DETECTED

## 2016-12-22 LAB — ECHOCARDIOGRAM COMPLETE
HEIGHTINCHES: 74 in
Weight: 3552.05 oz

## 2016-12-22 LAB — PHOSPHORUS: PHOSPHORUS: 3.2 mg/dL (ref 2.5–4.6)

## 2016-12-22 LAB — PROCALCITONIN: PROCALCITONIN: 0.47 ng/mL

## 2016-12-22 LAB — LIPASE, BLOOD: LIPASE: 32 U/L (ref 11–51)

## 2016-12-22 LAB — MAGNESIUM: MAGNESIUM: 1.8 mg/dL (ref 1.7–2.4)

## 2016-12-22 MED ORDER — FUROSEMIDE 10 MG/ML IJ SOLN
20.0000 mg | Freq: Once | INTRAMUSCULAR | Status: AC
  Administered 2016-12-22: 20 mg via INTRAVENOUS
  Filled 2016-12-22: qty 2

## 2016-12-22 MED ORDER — KCL IN DEXTROSE-NACL 40-5-0.9 MEQ/L-%-% IV SOLN
INTRAVENOUS | Status: DC
  Filled 2016-12-22 (×2): qty 1000

## 2016-12-22 NOTE — Progress Notes (Signed)
[facility] Progress Note  [name] 61 y.o. 1955/12/04  CC:   Pancreatitis   Subjective: Patient's abdominal pain is improving. Leukocytosis trending down. Tolerating full liquid diet. Continues to have 3-4 loose stools.  ROS : Positive for diarrhea. Negative for vomiting. Positive for abdominal discomfort   Objective: Vital signs in last 24 hours: Vitals:   12/21/16 2008 12/22/16 0505  BP: 128/70 139/68  Pulse: 64 66  Resp: 16 14  Temp: 98.2 F (36.8 C) 98.5 F (36.9 C)    Physical Exam:  Constitutional: He is oriented to person, place, and time and well-developed, well-nourished, and in no distress.  HENT:  Head: Normocephalic and atraumatic.  Eyes: EOM are normal. No scleral icterus.  Neck: Normal range of motion. Neck supple.  Cardiovascular: Normal rate, regular rhythm and normal heart sounds.   Pulmonary/Chest: Effort normal. No respiratory distress. He has no wheezes. He exhibits no tenderness.  Anterior exam only  Abdominal: Soft. Bowel sounds are normal. He exhibits distension. He exhibits no mass.  Mildly distended with epigastric tenderness to palpation  Musculoskeletal: He exhibits edema. He exhibits no tenderness.  1 + LEE   Lab Results:  Recent Labs  12/21/16 0411 12/22/16 0417  NA 132* 131*  K 3.4* 3.6  CL 100* 98*  CO2 25 26  GLUCOSE 88 111*  BUN 10 9  CREATININE 0.69 0.67  CALCIUM 7.6* 7.5*  MG 1.8 1.8  PHOS 3.5 3.2    Recent Labs  12/21/16 0411 12/22/16 0417  AST 44* 29  ALT 44 37  ALKPHOS 89 96  BILITOT 1.7* 1.4*  PROT 5.3* 4.9*  ALBUMIN 1.9* 1.8*    Recent Labs  12/20/16 0432 12/21/16 0411 12/22/16 0417  WBC 24.5* 22.8* 21.1*  NEUTROABS 22.6* 20.6*  --   HGB 11.3* 12.7* 11.7*  HCT 33.1* 36.0* 33.4*  MCV 92.7 94.0 93.8  PLT 239 303 313   No results for input(s): LABPROT, INR in the last 72 hours.    Assessment/Plan: - Severe pancreatitis. Patient is currently status post laparoscopic  cholecystectomy. - Leukocytosis improving - Abdominal pain. Improving - Diarrhea. Stable  Recommendations ------------------------ - Patient tolerating full liquid diet. Abdominal pain improving. Consider advancing diet to soft with low-fat. Patient was advised to go slow on diet. If starts having worsening symptoms, we may have to cut back to full liquid diet - Monitor blood work. - Follow GI pathogen panel which is pending. C. difficile was negative. - GI will follow  Kathi DerParag Xhaiden Coombs MD, FACP 12/22/2016, 11:30 AM  Pager [phone]  If no answer or after 5 PM call [phone]

## 2016-12-22 NOTE — Consult Note (Signed)
[facility] PROGRESS NOTE  PHINNEAS SHAKOOR [name] DOB: Sep 28, 1956 DOA: 12/11/2016 PCP: Sissy Hoff, MD  Brief summary 61 y.o.malewith PMH of asthma, anxiety, physically active (ran 4 times per week and lifted weights), was referred by PCP due to nausea, vomiting and abdominal pain. He was diagnosed with acute biliary pancreatitis. General surgery was consulted and he underwent laparoscopic cholecystectomy with intraoperative cholangiogram and placement of drain on 12/13/16. Diet was gradually advanced which she has tolerated. Having BMs. Due to worsening leukocytosis up to 27.5K, primary service obtained repeat CT abdomen and pelvis on 3/12/18which shows worsening pancreatitis without evidence of necrosis or pseudocyst formation and no definitive abscess, scattered ascites, small bilateral pleural effusions with collapse/consolidation in both lower lobes, cannot exclude pneumonia per report. Patient was made NPOand TRH was requested to reassess.  Assessment/Plan: Leukocytosis-suspect main driver is worsening of his pancreatitis. No respiratory symptoms suggestive of pneumonia. Clinical and radiological findings likely related to atelectasis/pleural effusion. Cdiff--NEG. 12/20/16 CT chest--small bilateral pleural effusions with bibasilar collapse and scattered upper lobe air space disease. blood culture x 2--neg. Already completed 8 days Zosyn-->d/c 3/15. Pulmonology eval: less likely pneumonia   Acute biliary pancreatitis. Status post laparoscopic cholecystectomy, IOC &drain placement on 12/13/16. pain controlled, tolerating diet, having BMs, no fevers, lipase normalized and improved LFTs.  -CT abdomen and pelvis on 3/12/18which shows worsening pancreatitis without evidence of necrosis or pseudocyst formation and no definitive abscess, scattered ascites. appreciate [facility]-->likely pancreatitis, gradually advance diet  Fluid overload. Received 12/21/16--lasix IV x 1. Still congested.  Decrease iv fluids. 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 appreciate ENT consult--over time this may spontaneously improve. Medialization laryngoplasty can help the voice and swallowing if necessary. -appreciate speech therapy eval-->mild pharyngeal phase dysphagia--can tolerate regular diet with thin liquid  Lower extremity pain and edema. venous duplex--neg Acute kidney injury. Resolved.  Anxiety. Stable. continue klonopin and zoloft Asthma.Stable without clinical bronchospasm.   Code Status: full Family Communication: d/w patient, rn (indicate person spoken with, relationship, and if by phone, the number) Disposition Plan: home soon    Consultants:  GI   Pulmonology    HPI/Subjective: Alert, reports chronic back pains  Objective: Vitals:   12/21/16 2008 12/22/16 0505  BP: 128/70 139/68  Pulse: 64 66  Resp: 16 14  Temp: 98.2 F (36.8 C) 98.5 F (36.9 C)    Intake/Output Summary (Last 24 hours) at 12/22/16 1257 Last data filed at 12/22/16 0505  Gross per 24 hour  Intake             1580 ml  Output             2300 ml  Net             -720 ml   Filed Weights   12/20/16 0644 12/21/16 0556 12/22/16 0505  Weight: 100 kg (220 lb 8 oz) 100.7 kg (222 lb 0.1 oz) 100.7 kg (222 lb 0.1 oz)    Exam:   General:  No distress   Cardiovascular: s1,s2 rrr  Respiratory: crackles LL  Abdomen: soft, nt   Musculoskeletal: mild edema LE   Data Reviewed: Basic Metabolic Panel:  Recent Labs Lab 12/18/16 0443 12/19/16 0400 12/20/16 0432 12/21/16 0411 12/22/16 0417  NA 135 134* 133* 132* 131*  K 2.9* 3.4* 3.8 3.4* 3.6  CL 101 101 104 100* 98*  CO2 27 27 23 25 26   GLUCOSE 118* 117* 119* 88 111*  BUN 14 13 11 10  9  CREATININE 0.69 0.67 0.57* 0.69 0.67  CALCIUM 7.6* 7.3* 7.5* 7.6* 7.5*  MG 1.9  --  1.9 1.8 1.8  PHOS  --   --   --  3.5 3.2   Liver Function Tests:  Recent Labs Lab 12/16/16 0405 12/18/16 0443 12/21/16 0411  12/22/16 0417  AST 28 26 44* 29  ALT 26 24 44 37  ALKPHOS 60 67 89 96  BILITOT 1.8* 2.0* 1.7* 1.4*  PROT 4.6* 4.8* 5.3* 4.9*  ALBUMIN 2.0* 1.9* 1.9* 1.8*    Recent Labs Lab 12/16/16 0405 12/18/16 0443 12/21/16 0411 12/22/16 0417  LIPASE 22 36 33 32   No results for input(s): AMMONIA in the last 168 hours. CBC:  Recent Labs Lab 12/16/16 0405 12/17/16 0541 12/18/16 0443 12/19/16 0400 12/20/16 0432 12/21/16 0411 12/22/16 0417  WBC 17.2* 25.5* 27.5* 24.0* 24.5* 22.8* 21.1*  NEUTROABS 15.1* 23.5*  --   --  22.6* 20.6*  --   HGB 11.3* 11.3* 12.2* 11.5* 11.3* 12.7* 11.7*  HCT 32.4* 32.8* 34.5* 33.8* 33.1* 36.0* 33.4*  MCV 93.9 91.9 92.5 92.1 92.7 94.0 93.8  PLT 149* 174 197 221 239 303 313   Cardiac Enzymes: No results for input(s): CKTOTAL, CKMB, CKMBINDEX, TROPONINI in the last 168 hours. BNP (last 3 results) No results for input(s): BNP in the last 8760 hours.  ProBNP (last 3 results) No results for input(s): PROBNP in the last 8760 hours.  CBG: No results for input(s): GLUCAP in the last 168 hours.  Recent Results (from the past 240 hour(s))  Surgical PCR screen     Status: Abnormal   Collection Time: 12/13/16 10:55 AM  Result Value Ref Range Status   MRSA, PCR NEGATIVE NEGATIVE Final   Staphylococcus aureus POSITIVE (A) NEGATIVE Final    Comment:        The Xpert SA Assay (FDA approved for NASAL specimens in patients over 4 years of age), is one component of a comprehensive surveillance program.  Test performance has been validated by [facility] for patients greater than or equal to 19 year old. It is not intended to diagnose infection nor to guide or monitor treatment.   Culture, Urine     Status: None   Collection Time: 12/19/16  8:04 PM  Result Value Ref Range Status   Specimen Description URINE, RANDOM  Final   Special Requests NONE  Final   Culture   Final    NO GROWTH Performed at [facility], [address], [place], [place]  [address]    Report Status 12/21/2016 FINAL  Final  Culture, blood (Routine X 2) w Reflex to ID Panel     Status: None (Preliminary result)   Collection Time: 12/20/16  9:31 AM  Result Value Ref Range Status   Specimen Description BLOOD RIGHT ARM  Final   Special Requests BOTTLES DRAWN AEROBIC AND ANAEROBIC 10CC  Final   Culture   Final    NO GROWTH 2 DAYS Performed at [facility], [address], [place], [place] [address]    Report Status PENDING  Incomplete  Culture, blood (Routine X 2) w Reflex to ID Panel     Status: None (Preliminary result)   Collection Time: 12/20/16  9:36 AM  Result Value Ref Range Status   Specimen Description BLOOD RIGHT HAND  Final   Special Requests IN PEDIATRIC BOTTLE 2CC  Final   Culture   Final    NO GROWTH 2 DAYS Performed at [facility], [address], [place], [place] [address]    Report Status PENDING  Incomplete  C difficile quick scan w PCR reflex     Status: None   Collection Time: 12/20/16 12:31 PM  Result Value Ref Range Status   C Diff antigen NEGATIVE NEGATIVE Final   C Diff toxin NEGATIVE NEGATIVE Final   C Diff interpretation No C. difficile detected.  Final     Studies: Dg Swallowing Func-speech Pathology  Result Date: 12/21/2016 Objective Swallowing Evaluation: Type of Study: MBS-Modified Barium Swallow Study Patient Details Name: [name] MRN: [id number] Date of Birth: 1955-11-11 Today's Date: 12/21/2016 Time: SLP Start Time (ACUTE ONLY): 0835-SLP Stop Time (ACUTE ONLY): 0850 SLP Time Calculation (min) (ACUTE ONLY): 15 min Past Medical History: Past Medical History: Diagnosis Date . Anxiety  . Asthma   uses inhaler Past Surgical History: Past Surgical History: Procedure Laterality Date . CHOLECYSTECTOMY N/A 12/13/2016  Procedure: LAPAROSCOPIC CHOLECYSTECTOMY WITH INTRAOPERATIVE CHOLANGIOGRAM;  Surgeon: Chevis Pretty III, MD;  Location: [facility]S;  Service: General;  Laterality: N/A; . GUM SURGERY   HPI: 61 yo male adm to [facility] s/p  emergent cholecystectomy one week ago.  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  MBS ordered.  Imaging studies 3/12 showed worsening pancreatitis and concern for pna.   Subjective: pt awake in chair Assessment / Plan / Recommendation CHL IP CLINICAL IMPRESSIONS 12/21/2016 Clinical Impression Pt presents swallow ability that is [facility].  No aspiration and only trace upper penetration cleared with further swallows.  Swallow is timely and strong.  If pt is having occasional aspiration, suspect may be due to minimal decreased timing of laryngeal closure - but this was not observed.  Provided pt with compensation strategies to attempt if he notes coughing with intake including tight chin tuck posture with liquids and drinking liquids with slightly more viscocity until improved.  Using live video and written tips, educated pt to findings/recommendations.  Barium tablet taken with thin easily transited through pharynx and into esophagus.    Recommend when [facility] deems indicated to advance diet to regular.  Of note, pt reports he does not desire any more procedures in the hospital and wanted this SLP to document as much.  If ENT indicates, pt may benefit from OP SLP to aid with voicing.   SLP Visit Diagnosis Dysphagia, pharyngeal phase (R13.13) Attention and concentration deficit following -- Frontal lobe and executive function deficit following -- Impact on safety and function Mild aspiration risk   No flowsheet data found.  No flowsheet data found. CHL IP DIET RECOMMENDATION 12/21/2016 SLP Diet Recommendations Regular solids;Thin liquid Liquid Administration via Cup;Straw Medication Administration Whole meds with liquid Compensations Slow rate;Small sips/bites Postural Changes Remain semi-upright after after feeds/meals (Comment);Seated upright at 90 degrees   CHL IP OTHER RECOMMENDATIONS 12/21/2016 Recommended Consults -- Oral  Care Recommendations Oral care BID Other Recommendations --   No flowsheet data found.  No flowsheet data found.     CHL IP ORAL PHASE 12/21/2016 Oral Phase WFL Oral - Pudding Teaspoon -- Oral - Pudding Cup -- Oral - Honey Teaspoon -- Oral - Honey Cup -- Oral - Nectar Teaspoon -- Oral - Nectar Cup WFL Oral - Nectar Straw -- Oral - Thin Teaspoon WFL Oral - Thin Cup WFL Oral - Thin Straw WFL Oral - Puree WFL Oral - Mech Soft WFL Oral - Regular -- Oral - Multi-Consistency  WFL Oral - Pill WFL Oral Phase - Comment --  CHL IP PHARYNGEAL PHASE 12/21/2016 Pharyngeal Phase WFL Pharyngeal- Pudding Teaspoon -- Pharyngeal -- Pharyngeal- Pudding Cup -- Pharyngeal -- Pharyngeal- Honey Teaspoon -- Pharyngeal -- Pharyngeal- Honey Cup -- Pharyngeal -- Pharyngeal- Nectar Teaspoon -- Pharyngeal -- Pharyngeal- Nectar Cup WFL Pharyngeal -- Pharyngeal- Nectar Straw -- Pharyngeal -- Pharyngeal- Thin Teaspoon WFL Pharyngeal -- Pharyngeal- Thin Cup WFL;Penetration/Aspiration during swallow Pharyngeal Material enters airway, remains ABOVE vocal cords then ejected out Pharyngeal- Thin Straw WFL Pharyngeal -- Pharyngeal- Puree WFL Pharyngeal -- Pharyngeal- Mechanical Soft -- Pharyngeal -- Pharyngeal- Regular WFL Pharyngeal -- Pharyngeal- Multi-consistency -- Pharyngeal -- Pharyngeal- Pill WFL Pharyngeal -- Pharyngeal Comment --  CHL IP CERVICAL ESOPHAGEAL PHASE 12/21/2016 Cervical Esophageal Phase WFL Pudding Teaspoon -- Pudding Cup -- Honey Teaspoon -- Honey Cup -- Nectar Teaspoon -- Nectar Cup -- Nectar Straw -- Thin Teaspoon -- Thin Cup -- Thin Straw -- Puree -- Mechanical Soft -- Regular -- Multi-consistency -- Pill -- Cervical Esophageal Comment -- No flowsheet data found. Donavan Burnetamara Kimball, MS [facility] SLP [phone]               Scheduled Meds: . clonazePAM  1.5 mg Oral QHS  . enoxaparin (LOVENOX) injection  40 mg Subcutaneous Q24H  . fluconazole (DIFLUCAN) IV  100 mg Intravenous Q24H  . mometasone-formoterol  2 puff Inhalation BID  .  montelukast  10 mg Oral QHS  . nystatin  5 mL Oral QID  . protein supplement  8 oz Oral TID  . sertraline  200 mg Oral QHS   Continuous Infusions: . dextrose 5 % and 0.9 % NaCl with KCl 40 mEq/L      Principal Problem:   Acute gallstone pancreatitis Active Problems:   Hyponatremia   AKI (acute kidney injury) (HCC)   Leukocytosis   Hypokalemia   Thrush   Bilateral pleural effusion    Time spent: >35 minutes     Esperanza SheetsBURIEV, Dannielle Baskins N  [facility] Pager [phone]. If 7PM-7AM, please contact night-coverage at [email or web address], password [email or web address] 12/22/2016, 12:57 PM  LOS: 11 days

## 2016-12-22 NOTE — Progress Notes (Signed)
9 Days Post-Op  Subjective: His biggest complaint is his chronic back pain and inability to sleep.  Po intake still poor  Objective: Vital signs in last 24 hours: Temp:  [98.2 F (36.8 C)-98.8 F (37.1 C)] 98.5 F (36.9 C) (03/17 0505) Pulse Rate:  [64-66] 66 (03/17 0505) Resp:  [14-16] 14 (03/17 0505) BP: (128-139)/(68-78) 139/68 (03/17 0505) SpO2:  [97 %-98 %] 98 % (03/17 0505) Weight:  [100.7 kg (222 lb 0.1 oz)] 100.7 kg (222 lb 0.1 oz) (03/17 0505) Last BM Date: 12/21/16  Intake/Output from previous day: 03/16 0701 - 03/17 0700 In: 2060 [P.O.:960; I.V.:1100] Out: 2725 [Urine:2725] Intake/Output this shift: No intake/output data recorded.  Exam: Anxious Abdomen still full with mild tenderness  Lab Results:   Recent Labs  12/21/16 0411 12/22/16 0417  WBC 22.8* 21.1*  HGB 12.7* 11.7*  HCT 36.0* 33.4*  PLT 303 313   BMET  Recent Labs  12/21/16 0411 12/22/16 0417  NA 132* 131*  K 3.4* 3.6  CL 100* 98*  CO2 25 26  GLUCOSE 88 111*  BUN 10 9  CREATININE 0.69 0.67  CALCIUM 7.6* 7.5*   PT/INR No results for input(s): LABPROT, INR in the last 72 hours. ABG No results for input(s): PHART, HCO3 in the last 72 hours.  Invalid input(s): PCO2, PO2  Studies/Results: Ct Chest W Contrast  Result Date: 12/20/2016 CLINICAL DATA:  Leukocytosis. Laparoscopic cholecystectomy on 12/13/2016. EXAM: CT CHEST WITH CONTRAST TECHNIQUE: Multidetector CT imaging of the chest was performed during intravenous contrast administration. CONTRAST:  75mL ISOVUE-300 IOPAMIDOL (ISOVUE-300) INJECTION 61% COMPARISON:  Plain films 12/11/2016. abdominopelvic CT of 12/17/2016. FINDINGS: Cardiovascular: Tortuous thoracic aorta. Mild cardiomegaly, without pericardial effusion. Lad coronary artery atherosclerosis. Mediastinum/Nodes: No mediastinal or hilar adenopathy. Lungs/Pleura: Small bilateral pleural effusions. Mild motion degradation throughout. Collapse/consolidative change in both lung  bases. Mild patchy ground-glass opacities involving both upper lobes. Upper Abdomen: Normal imaged portions of the liver, stomach. Hypoattenuating posterior splenic low-density lesion is incompletely imaged and grossly similar to the prior to dedicated CT. Upper abdominal ascites and peripancreatic fluid are incompletely imaged. Musculoskeletal: Right hemidiaphragm elevation. No acute osseous abnormality. IMPRESSION: 1. Mildly motion degraded exam. 2. Small bilateral pleural effusions. Bibasilar collapse/ consolidative. suspicious for pneumonia. Patchy upper lobe ground-glass opacities are also favored to represent infection. These could represent subsegmental atelectasis. 3. Cardiomegaly with LAD coronary artery atherosclerosis. 4. Incompletely imaged upper abdominal findings, as on the dedicated CT of 12/17/2016. Electronically Signed   By: Jeronimo GreavesKyle  Talbot M.D.   On: 12/20/2016 11:06   Dg Swallowing Func-speech Pathology  Result Date: 12/21/2016 Objective Swallowing Evaluation: Type of Study: MBS-Modified Barium Swallow Study Patient Details Name: Merlinda FrederickDavid A Pennick MRN: [id number] Date of Birth: 21-Jul-1956 Today's Date: 12/21/2016 Time: SLP Start Time (ACUTE ONLY): 0835-SLP Stop Time (ACUTE ONLY): 0850 SLP Time Calculation (min) (ACUTE ONLY): 15 min Past Medical History: Past Medical History: Diagnosis Date . Anxiety  . Asthma   uses inhaler Past Surgical History: Past Surgical History: Procedure Laterality Date . CHOLECYSTECTOMY N/A 12/13/2016  Procedure: LAPAROSCOPIC CHOLECYSTECTOMY WITH INTRAOPERATIVE CHOLANGIOGRAM;  Surgeon: Chevis PrettyPaul [name], MD;  Location: [facility];  Service: General;  Laterality: N/A; . GUM SURGERY   HPI: 61 yo [facility] s/p emergent cholecystectomy one week ago.  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  MBS ordered.  Imaging studies 3/12 showed worsening pancreatitis and concern for pna.  Subjective: pt awake in chair Assessment / Plan / Recommendation CHL IP CLINICAL IMPRESSIONS 12/21/2016 Clinical Impression Pt presents swallow ability that is [facility].  No aspiration and only trace upper penetration cleared with further swallows.  Swallow is timely and strong.  If pt is having occasional aspiration, suspect may be due to minimal decreased timing of laryngeal closure - but this was not observed.  Provided pt with compensation strategies to attempt if he notes coughing with intake including tight chin tuck posture with liquids and drinking liquids with slightly more viscocity until improved.  Using live video and written tips, educated pt to findings/recommendations.  Barium tablet taken with thin easily transited through pharynx and into esophagus.    Recommend when [facility] deems indicated to advance diet to regular.  Of note, pt reports he does not desire any more procedures in the hospital and wanted this SLP to document as much.  If ENT indicates, pt may benefit from OP SLP to aid with voicing.   SLP Visit Diagnosis Dysphagia, pharyngeal phase (R13.13) Attention and concentration deficit following -- Frontal lobe and executive function deficit following -- Impact on safety and function Mild aspiration risk   No flowsheet data found.  No flowsheet data found. CHL IP DIET RECOMMENDATION 12/21/2016 SLP Diet Recommendations Regular solids;Thin liquid Liquid Administration via Cup;Straw Medication Administration Whole meds with liquid Compensations Slow rate;Small sips/bites Postural Changes Remain semi-upright after after feeds/meals (Comment);Seated upright at 90 degrees   CHL IP OTHER RECOMMENDATIONS 12/21/2016 Recommended Consults -- Oral Care Recommendations Oral care BID Other Recommendations --   No flowsheet data found.  No flowsheet data found.     CHL IP ORAL PHASE 12/21/2016 Oral Phase WFL Oral - Pudding Teaspoon -- Oral - Pudding Cup -- Oral - Honey Teaspoon -- Oral - Honey Cup -- Oral - Nectar  Teaspoon -- Oral - Nectar Cup WFL Oral - Nectar Straw -- Oral - Thin Teaspoon WFL Oral - Thin Cup WFL Oral - Thin Straw WFL Oral - Puree WFL Oral - Mech Soft WFL Oral - Regular -- Oral - Multi-Consistency WFL Oral - Pill WFL Oral Phase - Comment --  CHL IP PHARYNGEAL PHASE 12/21/2016 Pharyngeal Phase WFL Pharyngeal- Pudding Teaspoon -- Pharyngeal -- Pharyngeal- Pudding Cup -- Pharyngeal -- Pharyngeal- Honey Teaspoon -- Pharyngeal -- Pharyngeal- Honey Cup -- Pharyngeal -- Pharyngeal- Nectar Teaspoon -- Pharyngeal -- Pharyngeal- Nectar Cup WFL Pharyngeal -- Pharyngeal- Nectar Straw -- Pharyngeal -- Pharyngeal- Thin Teaspoon WFL Pharyngeal -- Pharyngeal- Thin Cup WFL;Penetration/Aspiration during swallow Pharyngeal Material enters airway, remains ABOVE vocal cords then ejected out Pharyngeal- Thin Straw WFL Pharyngeal -- Pharyngeal- Puree WFL Pharyngeal -- Pharyngeal- Mechanical Soft -- Pharyngeal -- Pharyngeal- Regular WFL Pharyngeal -- Pharyngeal- Multi-consistency -- Pharyngeal -- Pharyngeal- Pill WFL Pharyngeal -- Pharyngeal Comment --  CHL IP CERVICAL ESOPHAGEAL PHASE 12/21/2016 Cervical Esophageal Phase WFL Pudding Teaspoon -- Pudding Cup -- Honey Teaspoon -- Honey Cup -- Nectar Teaspoon -- Nectar Cup -- Nectar Straw -- Thin Teaspoon -- Thin Cup -- Thin Straw -- Puree -- Mechanical Soft -- Regular -- Multi-consistency -- Pill -- Cervical Esophageal Comment -- No flowsheet data found. Donavan Burnet, MS [facility] SLP [phone]               Anti-infectives: Anti-infectives    Start     Dose/Rate Route Frequency Ordered Stop   12/18/16 1300  fluconazole (DIFLUCAN) IVPB 100 mg     100 mg 50 mL/hr over 60 Minutes Intravenous Every 24 hours 12/18/16 1201  12/13/16 2000  piperacillin-tazobactam (ZOSYN) IVPB 3.375 g  Status:  Discontinued     3.375 g 12.5 mL/hr over 240 Minutes Intravenous Every 8 hours 12/13/16 1816 12/20/16 1437   12/13/16 0930  cefTRIAXone (ROCEPHIN) 2 g in dextrose 5 % 50 mL IVPB     2 g 100  mL/hr over 30 Minutes Intravenous On call to O.R. 12/13/16 0848 12/13/16 1517      Assessment/Plan: s/p Procedure(s): LAPAROSCOPIC CHOLECYSTECTOMY WITH INTRAOPERATIVE CHOLANGIOGRAM (N/A)  Severe pancreatitis s/p lap cole POD#9 Deconditioning, low Albumin WBC may just be reactive. Lipase now normal. Continuing current care  LOS: 11 days    Wilba Mutz A 12/22/2016

## 2016-12-22 NOTE — Progress Notes (Signed)
Echocardiogram 2D Echocardiogram has been performed.  [name], [name] 12/22/2016, 12:59 PM

## 2016-12-23 LAB — MAGNESIUM: Magnesium: 1.7 mg/dL (ref 1.7–2.4)

## 2016-12-23 LAB — CBC
HEMATOCRIT: 30.6 % — AB (ref 39.0–52.0)
Hemoglobin: 10.7 g/dL — ABNORMAL LOW (ref 13.0–17.0)
MCH: 32.7 pg (ref 26.0–34.0)
MCHC: 35 g/dL (ref 30.0–36.0)
MCV: 93.6 fL (ref 78.0–100.0)
Platelets: 354 10*3/uL (ref 150–400)
RBC: 3.27 MIL/uL — ABNORMAL LOW (ref 4.22–5.81)
RDW: 13.6 % (ref 11.5–15.5)
WBC: 18.4 10*3/uL — ABNORMAL HIGH (ref 4.0–10.5)

## 2016-12-23 LAB — BASIC METABOLIC PANEL
Anion gap: 7 (ref 5–15)
BUN: 9 mg/dL (ref 6–20)
CALCIUM: 7.7 mg/dL — AB (ref 8.9–10.3)
CO2: 25 mmol/L (ref 22–32)
CREATININE: 0.64 mg/dL (ref 0.61–1.24)
Chloride: 100 mmol/L — ABNORMAL LOW (ref 101–111)
GFR calc Af Amer: >60 mL/min (ref >60)
GFR calc non Af Amer: >60 mL/min (ref >60)
GLUCOSE: 109 mg/dL — AB (ref 65–99)
Potassium: 3.7 mmol/L (ref 3.5–5.1)
Sodium: 132 mmol/L — ABNORMAL LOW (ref 135–145)

## 2016-12-23 LAB — PHOSPHORUS: PHOSPHORUS: 3.6 mg/dL (ref 2.5–4.6)

## 2016-12-23 NOTE — Progress Notes (Signed)
Patient discharged to home with family, discharge instructions reviewed with patient who verbalized understanding. No new RX's.  

## 2016-12-23 NOTE — Progress Notes (Signed)
Patient ID: [name] FrederickDavid A Plyler, male   DOB: [birth year], 61 y.o.   MRN: [id number]   Patient now actually on the surgical service so will discharge home at his and his wife's request

## 2016-12-23 NOTE — Progress Notes (Signed)
Patient has already been discharge by surgical team.   He is feeling better. Tolerating soft diet.   WBCs improving. GI pathogen panel negative.  Long discussion with patient regarding pancreatitis and low-fat diet. Patient was advised to stay on low-fat diet for next 2 weeks Recommend follow-up with me in 2 weeks. Patient will need a repeat CBC and he may need a repeat CT scan in 3-4 weeks to document resolution of severe pancreatitis. We'll consider checking triglycerides as outpatient. Patient is also due for his colonoscopy  Approximately 15 minutes spent with the patient with 10 minutes spent in face-to-face consultation.   Kathi DerParag Crytal Pensinger MD, FACP 12/23/2016, 12:16 PM  Pager [phone]  If no answer or after 5 PM call [phone]

## 2016-12-23 NOTE — Progress Notes (Signed)
PROGRESS NOTE  NOHA KARASIK [name] DOB: 1956/02/02 DOA: 12/11/2016 PCP: Sissy Hoff, MD  Brief History:  61 y.o.malewith PMH of asthma, anxiety, physically active (ran 4 times per week and lifted weights), was referred by PCP due to nausea, vomiting and abdominal pain. He was diagnosed with acute biliary pancreatitis. General surgery was consulted and he underwent laparoscopic cholecystectomy with intraoperative cholangiogram and placement of drain on 12/13/16. Diet was gradually advanced which she has tolerated. Having BMs. Due to worsening leukocytosis up to 27.5K, primary service obtained repeat CT abdomen and pelvis on 3/12/18which shows worsening pancreatitis without evidence of necrosis or pseudocyst formation and no definitive abscess, scattered ascites, small bilateral pleural effusions with collapse/consolidation in both lower lobes, cannot exclude pneumonia per report. Patient was made NPOand TRH was requested to reassess.  Assessment/Plan: Leukocytosis: -afebrile and hemodynamically stable -suspect main driver is worsening of his pancreatitis -No respiratory symptoms suggestive of pneumonia. Clinical and radiological findings likely related to atelectasis/pleural effusion.  -Already had 8 days Zosyn-->d/c 3/15 -Check pro calcitonin 1.24>>>0.79.>>0.55>>0.47 -continues to have 3 watery stools daily-->check Cdiff--NEG; stool pathogen panel--neg -12/20/16 CT chest--small bilateral pleural effusions with bibasilar collapse and scattered upper lobe air space disease -blood culture x 2--neg -check UA--negative for pyuria -discontinue zosyn-->remained stable 48-72 hours after stopping all abx -consult pulmonary-->air space disease on CT not clinical indicative of pneumonia -WBC trending down-->18.4 on day of d/c  Acute biliary pancreatitis: -Status post laparoscopic cholecystectomy, IOC &drain placement on 12/13/16.  -pain controlled, tolerating diet, having BMs, no  fevers, lipase normalized and improved LFTs.  -Has been on IV Zosyn 12/13/16>>12/20/16.  - CT abdomen and pelvis on 3/12/18which shows worsening pancreatitis without evidence of necrosis or pseudocyst formation and no definitive abscess, scattered ascites -wife requests GI evaluation--appreciate [facility]-->likely pancreatitis, gradually advance diet--follow Dr. 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 Medialization laryngoplasty can help the voice and swallowing if necessary. -appreciate speech therapy eval-->mild pharyngeal phase dysphagia--can tolerate regular diet with thin liquid  Lower extremity pain and edema -venous duplex--neg  Hypokalemia:  -Replace and follow. -check mag--1.9  Acute kidney injury:  -Resolved.   Anxiety: -Stable -continue klonopin and zoloft  Asthma:  -Stable without clinical bronchospasm.   Family Communication:   Spouse updated at bedside  Consultants:  GI-[facility]; ENT--[facility]  Code Status:  FULL  DVT Prophylaxis:  [place] Lovenox   Procedures: As Listed in Progress Note Above  Antibiotics: None    Subjective: Patient denies fevers, chills, headache, chest pain, dyspnea, nausea, vomiting, diarrhea, abdominal pain, dysuria, hematuria, hematochezia, and melena.   Objective: Vitals:   12/22/16 2052 12/23/16 0020 12/23/16 0456 12/23/16 1039  BP: 128/76  135/75   Pulse: 76  75   Resp: 18  18   Temp: 98.7 F (37.1 C)  98.6 F (37 C)   TempSrc: Oral  Oral   SpO2: 99%  98% 98%  Weight:  96 kg (211 lb 10.3 oz)    Height:        Intake/Output Summary (Last 24 hours) at 12/23/16 1327 Last data filed at 12/23/16 1000  Gross per 24 hour  Intake           1637.5 ml  Output             3900 ml  Net          -  2262.5 ml   Weight change: -4.7 kg (-10 lb 5.8  oz) Exam:   General:  Pt is alert, follows commands appropriately, not in acute distress  HEENT: No icterus, No thrush, No neck mass, [place]/AT  Cardiovascular: RRR, S1/S2, no rubs, no gallops  Respiratory: fine bibasilar crackles, no wheeze  Abdomen: Soft/+BS, non tender, non distended, no guarding  Extremities: 1 + LE edema, No lymphangitis, No petechiae, No rashes, no synovitis   Data Reviewed: I have personally reviewed following labs and imaging studies Basic Metabolic Panel:  Recent Labs Lab 12/18/16 0443 12/19/16 0400 12/20/16 0432 12/21/16 0411 12/22/16 0417 12/23/16 0408  NA 135 134* 133* 132* 131* 132*  K 2.9* 3.4* 3.8 3.4* 3.6 3.7  CL 101 101 104 100* 98* 100*  CO2 27 27 23 25 26 25   GLUCOSE 118* 117* 119* 88 111* 109*  BUN 14 13 11 10 9 9   CREATININE 0.69 0.67 0.57* 0.69 0.67 0.64  CALCIUM 7.6* 7.3* 7.5* 7.6* 7.5* 7.7*  MG 1.9  --  1.9 1.8 1.8 1.7  PHOS  --   --   --  3.5 3.2 3.6   Liver Function Tests:  Recent Labs Lab 12/18/16 0443 12/21/16 0411 12/22/16 0417  AST 26 44* 29  ALT 24 44 37  ALKPHOS 67 89 96  BILITOT 2.0* 1.7* 1.4*  PROT 4.8* 5.3* 4.9*  ALBUMIN 1.9* 1.9* 1.8*    Recent Labs Lab 12/18/16 0443 12/21/16 0411 12/22/16 0417  LIPASE 36 33 32   No results for input(s): AMMONIA in the last 168 hours. Coagulation Profile: No results for input(s): INR, PROTIME in the last 168 hours. CBC:  Recent Labs Lab 12/17/16 0541  12/19/16 0400 12/20/16 0432 12/21/16 0411 12/22/16 0417 12/23/16 0408  WBC 25.5*  < > 24.0* 24.5* 22.8* 21.1* 18.4*  NEUTROABS 23.5*  --   --  22.6* 20.6*  --   --   HGB 11.3*  < > 11.5* 11.3* 12.7* 11.7* 10.7*  HCT 32.8*  < > 33.8* 33.1* 36.0* 33.4* 30.6*  MCV 91.9  < > 92.1 92.7 94.0 93.8 93.6  PLT 174  < > 221 239 303 313 354  < > = values in this interval not displayed. Cardiac Enzymes: No results for input(s): CKTOTAL, CKMB, CKMBINDEX, TROPONINI in the last 168 hours. BNP: Invalid input(s):  POCBNP CBG: No results for input(s): GLUCAP in the last 168 hours. HbA1C: No results for input(s): HGBA1C in the last 72 hours. Urine analysis:    Component Value Date/Time   COLORURINE AMBER (A) 12/19/2016 2004   APPEARANCEUR CLEAR 12/19/2016 2004   LABSPEC 1.020 12/19/2016 2004   PHURINE 7.0 12/19/2016 2004   GLUCOSEU NEGATIVE 12/19/2016 2004   HGBUR SMALL (A) 12/19/2016 2004   BILIRUBINUR NEGATIVE 12/19/2016 2004   KETONESUR NEGATIVE 12/19/2016 2004   PROTEINUR NEGATIVE 12/19/2016 2004   NITRITE NEGATIVE 12/19/2016 2004   LEUKOCYTESUR NEGATIVE 12/19/2016 2004   Sepsis Labs: @LABRCNTIP (procalcitonin:4,lacticidven:4) ) Recent Results (from the past 240 hour(s))  Culture, Urine     Status: None   Collection Time: 12/19/16  8:04 PM  Result Value Ref Range Status   Specimen Description URINE, RANDOM  Final   Special Requests NONE  Final   Culture   Final    NO GROWTH Performed at [facility], [address], [place], [place] [address]    Report Status 12/21/2016 FINAL  Final  Culture, blood (Routine X 2) w Reflex to ID Panel  Status: None (Preliminary result)   Collection Time: 12/20/16  9:31 AM  Result Value Ref Range Status   Specimen Description BLOOD RIGHT ARM  Final   Special Requests BOTTLES DRAWN AEROBIC AND ANAEROBIC 10CC  Final   Culture   Final    NO GROWTH 2 DAYS Performed at [facility], [address] [place], [place] [address]    Report Status PENDING  Incomplete  Culture, blood (Routine X 2) w Reflex to ID Panel     Status: None (Preliminary result)   Collection Time: 12/20/16  9:36 AM  Result Value Ref Range Status   Specimen Description BLOOD RIGHT HAND  Final   Special Requests IN PEDIATRIC BOTTLE 2CC  Final   Culture   Final    NO GROWTH 2 DAYS Performed at [facility], [address], [place], [place] [address]    Report Status PENDING  Incomplete  C difficile quick scan w PCR reflex     Status: None   Collection Time:  12/20/16 12:31 PM  Result Value Ref Range Status   C Diff antigen NEGATIVE NEGATIVE Final   C Diff toxin NEGATIVE NEGATIVE Final   C Diff interpretation No C. difficile detected.  Final  Gastrointestinal Panel by PCR , Stool     Status: None   Collection Time: 12/21/16 12:21 PM  Result Value Ref Range Status   Campylobacter species NOT DETECTED NOT DETECTED Final   Plesimonas shigelloides NOT DETECTED NOT DETECTED Final   Salmonella species NOT DETECTED NOT DETECTED Final   Yersinia enterocolitica NOT DETECTED NOT DETECTED Final   Vibrio species NOT DETECTED NOT DETECTED Final   Vibrio cholerae NOT DETECTED NOT DETECTED Final   Enteroaggregative E coli (EAEC) NOT DETECTED NOT DETECTED Final   Enteropathogenic E coli (EPEC) NOT DETECTED NOT DETECTED Final   Enterotoxigenic E coli (ETEC) NOT DETECTED NOT DETECTED Final   Shiga like toxin producing E coli (STEC) NOT DETECTED NOT DETECTED Final   Shigella/Enteroinvasive E coli (EIEC) NOT DETECTED NOT DETECTED Final   Cryptosporidium NOT DETECTED NOT DETECTED Final   Cyclospora cayetanensis NOT DETECTED NOT DETECTED Final   Entamoeba histolytica NOT DETECTED NOT DETECTED Final   Giardia lamblia NOT DETECTED NOT DETECTED Final   Adenovirus F40/41 NOT DETECTED NOT DETECTED Final   Astrovirus NOT DETECTED NOT DETECTED Final   Norovirus GI/GII NOT DETECTED NOT DETECTED Final   Rotavirus A NOT DETECTED NOT DETECTED Final   Sapovirus (I, II, IV, and V) NOT DETECTED NOT DETECTED Final     Scheduled Meds: . clonazePAM  1.5 mg Oral QHS  . enoxaparin (LOVENOX) injection  40 mg Subcutaneous Q24H  . fluconazole (DIFLUCAN) IV  100 mg Intravenous Q24H  . mometasone-formoterol  2 puff Inhalation BID  . montelukast  10 mg Oral QHS  . nystatin  5 mL Oral QID  . protein supplement  8 oz Oral TID  . sertraline  200 mg Oral QHS   Continuous Infusions: . dextrose 5 % and 0.9 % NaCl with KCl 40 mEq/L 50 mL/hr at 12/22/16 1824     Procedures/Studies: Ct Abdomen Pelvis Wo Contrast  Result Date: 12/11/2016 CLINICAL DATA:  Abdominal pain since yesterday. EXAM: CT ABDOMEN AND PELVIS WITHOUT CONTRAST TECHNIQUE: Multidetector CT imaging of the abdomen and pelvis was performed following the standard protocol without IV contrast. COMPARISON:  None. FINDINGS: Lower chest: Small bilateral pleural effusions and bibasilar atelectasis. The heart is mildly enlarged. Coronary artery calcifications are noted. The  distal esophagus is grossly normal. Hepatobiliary: No focal hepatic lesions or intrahepatic biliary dilatation. There are gallstones and sludge noted in the gallbladder. No common bile duct dilatation. Pancreas: The pancreas is enlarged and diffusely inflamed. There is also extensive peripancreatic inflammation/phlegmon. Small fluid collections likely represent developing pseudocysts. No pancreatic mass. No pancreatic duct dilatation. No obvious obstructing common bile duct stone. There is a small duodenal diverticulum noted at near the pancreatic head. Spleen: Normal size. Simple appearing cysts noted. Peripancreatic fluid. Adrenals/Urinary Tract: The adrenal glands and kidneys are unremarkable. No renal, ureteral or bladder calculi or mass. Moderate fluid and inflammation noted in the anterior pararenal spaces, left greater than right and also fluid tracking back along the pericolic gutters. Stomach/Bowel: The stomach, duodenum, small bowel and colon are unremarkable. No inflammatory changes, mass lesions or obstructive findings. The terminal ileum is normal. The appendix is normal. Vascular/Lymphatic: No significant aortic calcifications or aneurysm. Small scattered mesenteric and retroperitoneal lymph nodes but no mass or adenopathy. Reproductive: The prostate gland and seminal vesicles are unremarkable. Other: Small to moderate amount of free pelvic fluid. No inguinal mass or adenopathy. Musculoskeletal: No significant bony findings.  IMPRESSION: CT findings consistent with acute pancreatitis as discussed above. Gallstones and gallbladder sludge are noted. No obvious common bile duct stone. Small bilateral pleural effusions and bibasilar atelectasis. Electronically Signed   By: Rudie MeyerP.  Gallerani M.D.   On: 12/11/2016 16:11   Dg Chest 2 View  Result Date: 12/11/2016 CLINICAL DATA:  Shortness of Breath EXAM: CHEST  2 VIEW COMPARISON:  None. FINDINGS: Low lung volumes with bibasilar atelectasis or infiltrates. Mild cardiomegaly. No overt edema. No effusions or acute bony abnormality. IMPRESSION: Mild cardiomegaly.  Bibasilar atelectasis or infiltrates. Electronically Signed   By: Charlett NoseKevin  Dover M.D.   On: 12/11/2016 14:04   Dg Cholangiogram Operative  Result Date: 12/13/2016 CLINICAL DATA:  61 year old male with a history of cholecystitis EXAM: INTRAOPERATIVE CHOLANGIOGRAM TECHNIQUE: Cholangiographic images from the C-arm fluoroscopic device were submitted for interpretation post-operatively. Please see the procedural report for the amount of contrast and the fluoroscopy time utilized. COMPARISON:  Ultrasound 12/11/2016 FINDINGS: Surgical instruments project over the upper abdomen. There is cannulation of the cystic duct/gallbladder neck, with antegrade infusion of contrast. Caliber of the extrahepatic ductal system within normal limits. No large filling defect identified. Free flow of contrast across the ampulla. IMPRESSION: Intraoperative cholangiogram demonstrates extrahepatic biliary ducts of unremarkable caliber, with no large filling defect identified. Free flow of contrast across the ampulla. Please refer to the dictated operative report for full details of intraoperative findings and procedure Electronically Signed   By: Gilmer MorJaime  Wagner D.O.   On: 12/13/2016 16:31   Ct Chest W Contrast  Result Date: 12/20/2016 CLINICAL DATA:  Leukocytosis. Laparoscopic cholecystectomy on 12/13/2016. EXAM: CT CHEST WITH CONTRAST TECHNIQUE: Multidetector CT  imaging of the chest was performed during intravenous contrast administration. CONTRAST:  75mL ISOVUE-300 IOPAMIDOL (ISOVUE-300) INJECTION 61% COMPARISON:  Plain films 12/11/2016. abdominopelvic CT of 12/17/2016. FINDINGS: Cardiovascular: Tortuous thoracic aorta. Mild cardiomegaly, without pericardial effusion. Lad coronary artery atherosclerosis. Mediastinum/Nodes: No mediastinal or hilar adenopathy. Lungs/Pleura: Small bilateral pleural effusions. Mild motion degradation throughout. Collapse/consolidative change in both lung bases. Mild patchy ground-glass opacities involving both upper lobes. Upper Abdomen: Normal imaged portions of the liver, stomach. Hypoattenuating posterior splenic low-density lesion is incompletely imaged and grossly similar to the prior to dedicated CT. Upper abdominal ascites and peripancreatic fluid are incompletely imaged. Musculoskeletal: Right hemidiaphragm elevation. No acute osseous abnormality. IMPRESSION: 1. Mildly motion  degraded exam. 2. Small bilateral pleural effusions. Bibasilar collapse/ consolidative. suspicious for pneumonia. Patchy upper lobe ground-glass opacities are also favored to represent infection. These could represent subsegmental atelectasis. 3. Cardiomegaly with LAD coronary artery atherosclerosis. 4. Incompletely imaged upper abdominal findings, as on the dedicated CT of 12/17/2016. Electronically Signed   By: Jeronimo Greaves M.D.   On: 12/20/2016 11:06   Ct Abdomen Pelvis W Contrast  Result Date: 12/17/2016 CLINICAL DATA:  Laparoscopic cholecystectomy 4 days ago with drain placement, biliary pancreatitis, rising white blood cell count. EXAM: CT ABDOMEN AND PELVIS WITH CONTRAST TECHNIQUE: Multidetector CT imaging of the abdomen and pelvis was performed using the standard protocol following bolus administration of intravenous contrast. CONTRAST:  ISOVUE-300 IOPAMIDOL (ISOVUE-300) INJECTION 61%, 15mL ISOVUE-300 IOPAMIDOL (ISOVUE-300) INJECTION 61%  COMPARISON:  12/11/2016. FINDINGS: Lower chest: Small bilateral pleural effusions with collapse/ consolidation in both lower lobes, left greater than right. Heart is enlarged. No pericardial effusion. Hepatobiliary: The liver is unremarkable. Cholecystectomy. No biliary ductal dilatation. Pancreas: Extensive inflammatory stranding and fluid surrounds the pancreas, increased from 12/11/2016. Pancreas appears uniform in attenuation. Spleen: 2.9 cm low-attenuation lesion in the medial spleen is unchanged. Adrenals/Urinary Tract: Adrenal glands and kidneys are unremarkable. Ureters are decompressed. Bladder is grossly unremarkable. Stomach/Bowel: Stomach, small bowel, appendix and colon are unremarkable. Vascular/Lymphatic: Minimal atherosclerotic calcification of the arterial vasculature. Peripancreatic lymph node measures 14 mm. Reproductive: Prostate is visualized. Other: Mild presacral edema. Bilateral inguinal hernias contain fat. Scattered ascites. Extensive peripancreatic fluid and stranding. Percutaneous perihepatic drain in place. Musculoskeletal: No worrisome lytic or sclerotic lesions. Subtotal fusion of the sacroiliac joints. Degenerative changes are seen in the spine. IMPRESSION: 1. Worsening pancreatitis. No evidence of necrosis or pseudocyst formation. No definite abscess. 2. Scattered ascites. 3. Small bilateral pleural effusions with collapse/consolidation in both lower lobes, cannot exclude pneumonia. Electronically Signed   By: Leanna Battles M.D.   On: 12/17/2016 12:47   Dg Swallowing Func-speech Pathology  Result Date: 12/21/2016 Objective Swallowing Evaluation: Type of Study: MBS-Modified Barium Swallow Study Patient Details Name: LIBERATO STANSBERY MRN: [id number] Date of Birth: April 15, 1956 Today's Date: 12/21/2016 Time: SLP Start Time (ACUTE ONLY): 0835-SLP Stop Time (ACUTE ONLY): 0850 SLP Time Calculation (min) (ACUTE ONLY): 15 min Past Medical History: Past Medical History: Diagnosis Date . Anxiety   . Asthma   uses inhaler Past Surgical History: Past Surgical History: Procedure Laterality Date . CHOLECYSTECTOMY N/A 12/13/2016  Procedure: LAPAROSCOPIC CHOLECYSTECTOMY WITH INTRAOPERATIVE CHOLANGIOGRAM;  Surgeon: Chevis Pretty III, MD;  Location: [facility];  Service: General;  Laterality: N/A; . GUM SURGERY   HPI: 61 yo male adm to [facility] s/p emergent cholecystectomy one week ago.  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  MBS ordered.  Imaging studies 3/12 showed worsening pancreatitis and concern for pna.   Subjective: pt awake in chair Assessment / Plan / Recommendation CHL IP CLINICAL IMPRESSIONS 12/21/2016 Clinical Impression Pt presents swallow ability that is [facility].  No aspiration and only trace upper penetration cleared with further swallows.  Swallow is timely and strong.  If pt is having occasional aspiration, suspect may be due to minimal decreased timing of laryngeal closure - but this was not observed.  Provided pt with compensation strategies to attempt if he notes coughing with intake including tight chin tuck posture with liquids and drinking liquids with slightly more viscocity until improved.  Using live video and written tips, educated pt  to findings/recommendations.  Barium tablet taken with thin easily transited through pharynx and into esophagus.    Recommend when [facility] deems indicated to advance diet to regular.  Of note, pt reports he does not desire any more procedures in the hospital and wanted this SLP to document as much.  If ENT indicates, pt may benefit from OP SLP to aid with voicing.   SLP Visit Diagnosis Dysphagia, pharyngeal phase (R13.13) Attention and concentration deficit following -- Frontal lobe and executive function deficit following -- Impact on safety and function Mild aspiration risk   No flowsheet data found.  No flowsheet data found. CHL IP DIET RECOMMENDATION 12/21/2016 SLP Diet  Recommendations Regular solids;Thin liquid Liquid Administration via Cup;Straw Medication Administration Whole meds with liquid Compensations Slow rate;Small sips/bites Postural Changes Remain semi-upright after after feeds/meals (Comment);Seated upright at 90 degrees   CHL IP OTHER RECOMMENDATIONS 12/21/2016 Recommended Consults -- Oral Care Recommendations Oral care BID Other Recommendations --   No flowsheet data found.  No flowsheet data found.     CHL IP ORAL PHASE 12/21/2016 Oral Phase WFL Oral - Pudding Teaspoon -- Oral - Pudding Cup -- Oral - Honey Teaspoon -- Oral - Honey Cup -- Oral - Nectar Teaspoon -- Oral - Nectar Cup WFL Oral - Nectar Straw -- Oral - Thin Teaspoon WFL Oral - Thin Cup WFL Oral - Thin Straw WFL Oral - Puree WFL Oral - Mech Soft WFL Oral - Regular -- Oral - Multi-Consistency WFL Oral - Pill WFL Oral Phase - Comment --  CHL IP PHARYNGEAL PHASE 12/21/2016 Pharyngeal Phase WFL Pharyngeal- Pudding Teaspoon -- Pharyngeal -- Pharyngeal- Pudding Cup -- Pharyngeal -- Pharyngeal- Honey Teaspoon -- Pharyngeal -- Pharyngeal- Honey Cup -- Pharyngeal -- Pharyngeal- Nectar Teaspoon -- Pharyngeal -- Pharyngeal- Nectar Cup WFL Pharyngeal -- Pharyngeal- Nectar Straw -- Pharyngeal -- Pharyngeal- Thin Teaspoon WFL Pharyngeal -- Pharyngeal- Thin Cup WFL;Penetration/Aspiration during swallow Pharyngeal Material enters airway, remains ABOVE vocal cords then ejected out Pharyngeal- Thin Straw WFL Pharyngeal -- Pharyngeal- Puree WFL Pharyngeal -- Pharyngeal- Mechanical Soft -- Pharyngeal -- Pharyngeal- Regular WFL Pharyngeal -- Pharyngeal- Multi-consistency -- Pharyngeal -- Pharyngeal- Pill WFL Pharyngeal -- Pharyngeal Comment --  CHL IP CERVICAL ESOPHAGEAL PHASE 12/21/2016 Cervical Esophageal Phase WFL Pudding Teaspoon -- Pudding Cup -- Honey Teaspoon -- Honey Cup -- Nectar Teaspoon -- Nectar Cup -- Nectar Straw -- Thin Teaspoon -- Thin Cup -- Thin Straw -- Puree -- Mechanical Soft -- Regular -- Multi-consistency  -- Pill -- Cervical Esophageal Comment -- No flowsheet data found. Donavan Burnet, MS [facility] SLP [phone]              US Abdomen Limited Ruq  Result Date: 12/11/2016 CLINICAL DATA:  Acute pancreatitis EXAM: US ABDOMEN LIMITED - RIGHT UPPER QUADRANT COMPARISON:  CT abdomen pelvis 12/11/2016 FINDINGS: Gallbladder: Multiple gallstones measuring up to 8 mm in diameter. Gallbladder wall not significantly thickened. Positive sonographic Murphy sign. Common bile duct: Diameter: 4.5 mm Liver: No focal lesion identified. Within normal limits in parenchymal echogenicity. Small amount of fluid around the liver IMPRESSION: Cholelithiasis. Patient tender over the gallbladder compatible with positive sonographic Murphy sign. No biliary dilatation. Small amount of free fluid around the liver. Electronically Signed   By: Marlan Palau M.D.   On: 12/11/2016 17:29    Abygail Galeno, Lawson, DO  [facility] Pager [phone]  If 7PM-7AM, please contact night-coverage [email or web address] Password [email or web address] 12/23/2016, 1:27 PM   LOS: 12 days

## 2016-12-23 NOTE — Discharge Summary (Signed)
Physician Discharge Summary  Patient ID: [name] FrederickDavid A [name] MRN: [id number] DOB/AGE: 02-12-1956 61 y.o.  Admit date: 12/11/2016 Discharge date: 12/23/2016  Admission Diagnoses:  Discharge Diagnoses:  Principal Problem:   Acute gallstone pancreatitis Active Problems:   Hyponatremia   AKI (acute kidney injury) (HCC)   Leukocytosis   Hypokalemia   Thrush   Bilateral pleural effusion   Discharged Condition: good  Hospital Course: prolonged hospital coarse after admission for gallstone pancreatitis.  Had eventual surgery and then continued care for pancreatitis, deconditioning, pleural effusions, etc.  Eventually slowly improved. At discharge, he was tolerating a regular diet and was pain free except for chronic back pain. Had vocal cord paralysis also.  Anesthesia notified and ENT saw patient He can advance to a soft diet.  Consults: IR, Hospitalists, Gastroenterology, ENT  Significant Diagnostic Studies:   Treatments: surgery: laparoscopic cholecystectomy with IOC  Discharge Exam: Blood pressure 135/75, pulse 75, temperature 98.6 F (37 C), temperature source Oral, resp. rate 18, height 6\' 2"  (1.88 m), weight 96 kg (211 lb 10.3 oz), SpO2 98 %. General appearance: alert, cooperative and no distress Resp: clear to auscultation bilaterally Cardio: regular rate and rhythm, S1, S2 normal, no murmur, click, rub or gallop Incision/Wound:abdomen soft, incisions healed  Disposition: Final discharge disposition not confirmed  Discharge Instructions    Discharge patient    Complete by:  As directed    Discharge disposition:  01-Home or Self Care   Discharge patient date:  12/23/2016     Allergies as of 12/23/2016   No Known Allergies     Medication List    TAKE these medications   ADVAIR DISKUS 250-50 MCG/DOSE Aepb Generic drug:  Fluticasone-Salmeterol Inhale 1 puff into the lungs 2 (two) times daily.   clonazePAM 0.5 MG tablet Commonly known as:  KLONOPIN Take 1.5 mg by mouth  at bedtime.   montelukast 10 MG tablet Commonly known as:  SINGULAIR Take 10 mg by mouth at bedtime.   MULTIVITAMIN ADULT PO Take 1 tablet by mouth every evening.   naproxen sodium 220 MG tablet Commonly known as:  ANAPROX Take 440 mg by mouth every 12 (twelve) hours as needed (pain).   sertraline 100 MG tablet Commonly known as:  ZOLOFT Take 200 mg by mouth at bedtime.      Follow-up Information    [name],PAUL S, MD. Schedule an appointment as soon as possible for a visit in 3 week(s).   Specialty:  General Surgery Contact information: [address] [place] [address] [phone]        Lynetta MareTH,ALISON P, MD Follow up.   Specialty:  Orthopedic Surgery Contact information: [address] [facility] [place] [address] [phone]           Signed: Shelly RubensteinBLACKMAN,Raelle Chambers A 12/23/2016, 11:10 AM

## 2016-12-23 NOTE — Progress Notes (Signed)
10 Days Post-Op  Subjective: Tolerating a diet without increase in abdominal pain Only complaint is back pain He really wants to go home  Objective: Vital signs in last 24 hours: Temp:  [98.2 F (36.8 C)-98.7 F (37.1 C)] 98.6 F (37 C) (03/18 0456) Pulse Rate:  [75-76] 75 (03/18 0456) Resp:  [18] 18 (03/18 0456) BP: (122-135)/(75-83) 135/75 (03/18 0456) SpO2:  [95 %-99 %] 98 % (03/18 0456) Weight:  [96 kg (211 lb 10.3 oz)] 96 kg (211 lb 10.3 oz) (03/18 0020) Last BM Date: 12/22/16  Intake/Output from previous day: 03/17 0701 - 03/18 0700 In: 1157.5 [P.O.:960; I.V.:197.5] Out: 4200 [Urine:4200] Intake/Output this shift: No intake/output data recorded.  Exam: Looks comfortable Abdomen soft, non-distended, almost non tender  Lab Results:   Recent Labs  12/22/16 0417 12/23/16 0408  WBC 21.1* 18.4*  HGB 11.7* 10.7*  HCT 33.4* 30.6*  PLT 313 354   BMET  Recent Labs  12/22/16 0417 12/23/16 0408  NA 131* 132*  K 3.6 3.7  CL 98* 100*  CO2 26 25  GLUCOSE 111* 109*  BUN 9 9  CREATININE 0.67 0.64  CALCIUM 7.5* 7.7*   PT/INR No results for input(s): LABPROT, INR in the last 72 hours. ABG No results for input(s): PHART, HCO3 in the last 72 hours.  Invalid input(s): PCO2, PO2  Studies/Results: No results found.  Anti-infectives: Anti-infectives    Start     Dose/Rate Route Frequency Ordered Stop   12/18/16 1300  fluconazole (DIFLUCAN) IVPB 100 mg     100 mg 50 mL/hr over 60 Minutes Intravenous Every 24 hours 12/18/16 1201     12/13/16 2000  piperacillin-tazobactam (ZOSYN) IVPB 3.375 g  Status:  Discontinued     3.375 g 12.5 mL/hr over 240 Minutes Intravenous Every 8 hours 12/13/16 1816 12/20/16 1437   12/13/16 0930  cefTRIAXone (ROCEPHIN) 2 g in dextrose 5 % 50 mL IVPB     2 g 100 mL/hr over 30 Minutes Intravenous On call to O.R. 12/13/16 0848 12/13/16 1517      Assessment/Plan: s/p Procedure(s): LAPAROSCOPIC CHOLECYSTECTOMY WITH INTRAOPERATIVE  CHOLANGIOGRAM (N/A)  Recovering from severe pancreatitis  From a general surg standpoint, I think he can be discharged today for follow up in our office as an outpatient  LOS: 12 days    Roshawna Colclasure A 12/23/2016

## 2016-12-23 NOTE — Discharge Instructions (Signed)
[facility] ______[facility] LAPAROSCOPIC SURGERY: POST OP INSTRUCTIONS Always review your discharge instruction sheet given to you by the facility where your surgery was performed. IF YOU HAVE DISABILITY OR FAMILY LEAVE FORMS, YOU MUST BRING THEM TO THE OFFICE FOR PROCESSING.   DO NOT GIVE THEM TO YOUR DOCTOR.  1. A prescription for pain medication may be given to you upon discharge.  Take your pain medication as prescribed, if needed.  If narcotic pain medicine is not needed, then you may take acetaminophen (Tylenol) or ibuprofen (Advil) as needed. 2. Take your usually prescribed medications unless otherwise directed. 3. If you need a refill on your pain medication, please contact your pharmacy.  They will contact our office to request authorization. Prescriptions will not be filled after 5pm or on week-ends. 4. You should follow a light diet the first few days after arrival home, such as soup and crackers, etc.  Be sure to include lots of fluids daily. 5. Most patients will experience some swelling and bruising in the area of the incisions.  Ice packs will help.  Swelling and bruising can take several days to resolve.  6. It is common to experience some constipation if taking pain medication after surgery.  Increasing fluid intake and taking a stool softener (such as Colace) will usually help or prevent this problem from occurring.  A mild laxative (Milk of Magnesia or Miralax) should be taken according to package instructions if there are no bowel movements after 48 hours. 7. Unless discharge instructions indicate otherwise, you may remove your bandages 24-48 hours after surgery, and you may shower at that time.  You may have steri-strips (small skin tapes) in place directly over the incision.  These strips should be left on the skin for 7-10 days.  If your surgeon used skin glue on the incision, you may shower in 24 hours.  The glue will flake off over the next 2-3 weeks.  Any sutures or  staples will be removed at the office during your follow-up visit. 8. ACTIVITIES:  You may resume regular (light) daily activities beginning the next day--such as daily self-care, walking, climbing stairs--gradually increasing activities as tolerated.  You may have sexual intercourse when it is comfortable.  a. You may drive when you are no longer taking prescription pain medication, you can comfortably wear a seatbelt, and you can safely maneuver your car and apply brakes. b. RETURN TO WORK:  __________________________________________________________ 9. You should see your doctor in the office for a follow-up appointment approximately 2-3 weeks after your surgery.  Make sure that you call for this appointment within a day or two after you arrive home to insure a convenient appointment time. 10. OTHER INSTRUCTIONS:OK TO SHOWER, SWIM, RESUME NORMAL ACTIVITY __________________________________________________________________________________________________________________________ __________________________________________________________________________________________________________________________ WHEN TO CALL YOUR DOCTOR: 1. Fever over 101.0 2. Inability to urinate 3. Continued bleeding from incision. 4. Increased pain, redness, or drainage from the incision. 5. Increasing abdominal pain  The clinic staff is available to answer your questions during regular business hours.  Please dont hesitate to call and ask to speak to one of the nurses for clinical concerns.  If you have a medical emergency, go to the nearest emergency room or call 911.  A surgeon from [facility] is always on call at the hospital. [address], [address], [place], [place]  [address] ? [address] [address], [place], [place]   [address] [phone] ? [phone] ? FAX [phone] Web site: [email or web address]

## 2016-12-25 LAB — CULTURE, BLOOD (ROUTINE X 2)
Culture: NO GROWTH
Culture: NO GROWTH

## 2017-01-07 ENCOUNTER — Other Ambulatory Visit: Payer: Self-pay | Admitting: Gastroenterology

## 2017-01-07 DIAGNOSIS — K851 Biliary acute pancreatitis without necrosis or infection: Secondary | ICD-10-CM

## 2017-01-21 ENCOUNTER — Other Ambulatory Visit: Payer: BC Managed Care – PPO

## 2017-01-24 ENCOUNTER — Ambulatory Visit
Admission: RE | Admit: 2017-01-24 | Discharge: 2017-01-24 | Disposition: A | Discharge status: Home / Self Care | Payer: BC Managed Care – PPO | Source: Ambulatory Visit | Attending: Gastroenterology | Admitting: Gastroenterology

## 2017-01-24 DIAGNOSIS — K851 Biliary acute pancreatitis without necrosis or infection: Secondary | ICD-10-CM

## 2017-01-24 MED ORDER — IOPAMIDOL (ISOVUE-300) INJECTION 61%
100.0000 mL | Freq: Once | INTRAVENOUS | Status: AC | PRN
Start: 2017-01-24 — End: 2017-01-24
  Administered 2017-01-24: 100 mL via INTRAVENOUS

## 2017-01-25 ENCOUNTER — Encounter: Payer: Self-pay | Admitting: Gastroenterology

## 2017-01-25 ENCOUNTER — Other Ambulatory Visit (HOSPITAL_COMMUNITY): Payer: Self-pay | Admitting: Gastroenterology

## 2017-01-25 DIAGNOSIS — K863 Pseudocyst of pancreas: Secondary | ICD-10-CM

## 2017-01-25 NOTE — Progress Notes (Unsigned)
Case D/W Dr. [address]oreta Ave / Interventional Radiology. Plan is  for CT guided drainage of Infected pancreatic pseudocyst. D/W patient's wife. Patient continues to decline  Admission for further management.    Kathi Der MD, FACP 01/25/2017, 9:10 AM  Pager [phone]  If no answer or after 5 PM call [phone]

## 2017-01-27 ENCOUNTER — Inpatient Hospital Stay (HOSPITAL_COMMUNITY)
Admission: EM | Admit: 2017-01-27 | Discharge: 2017-02-02 | DRG: 438 | Disposition: A | Discharge status: Home Health | Payer: BC Managed Care – PPO | Attending: General Surgery | Admitting: General Surgery

## 2017-01-27 ENCOUNTER — Encounter (HOSPITAL_COMMUNITY): Payer: Self-pay | Admitting: Emergency Medicine

## 2017-01-27 ENCOUNTER — Emergency Department (HOSPITAL_COMMUNITY): Payer: BC Managed Care – PPO

## 2017-01-27 DIAGNOSIS — R1013 Epigastric pain: Secondary | ICD-10-CM | POA: Diagnosis present

## 2017-01-27 DIAGNOSIS — K863 Pseudocyst of pancreas: Secondary | ICD-10-CM | POA: Diagnosis present

## 2017-01-27 DIAGNOSIS — R490 Dysphonia: Secondary | ICD-10-CM | POA: Diagnosis not present

## 2017-01-27 DIAGNOSIS — E871 Hypo-osmolality and hyponatremia: Secondary | ICD-10-CM | POA: Diagnosis present

## 2017-01-27 DIAGNOSIS — D6489 Other specified anemias: Secondary | ICD-10-CM | POA: Diagnosis present

## 2017-01-27 DIAGNOSIS — F419 Anxiety disorder, unspecified: Secondary | ICD-10-CM | POA: Diagnosis present

## 2017-01-27 DIAGNOSIS — N170 Acute kidney failure with tubular necrosis: Secondary | ICD-10-CM | POA: Diagnosis present

## 2017-01-27 DIAGNOSIS — K859 Acute pancreatitis without necrosis or infection, unspecified: Secondary | ICD-10-CM

## 2017-01-27 DIAGNOSIS — Z6821 Body mass index (BMI) 21.0-21.9, adult: Secondary | ICD-10-CM

## 2017-01-27 DIAGNOSIS — R627 Adult failure to thrive: Secondary | ICD-10-CM | POA: Diagnosis present

## 2017-01-27 DIAGNOSIS — B370 Candidal stomatitis: Secondary | ICD-10-CM | POA: Diagnosis not present

## 2017-01-27 DIAGNOSIS — J45909 Unspecified asthma, uncomplicated: Secondary | ICD-10-CM | POA: Diagnosis present

## 2017-01-27 DIAGNOSIS — E876 Hypokalemia: Secondary | ICD-10-CM | POA: Diagnosis not present

## 2017-01-27 DIAGNOSIS — J9811 Atelectasis: Secondary | ICD-10-CM | POA: Diagnosis not present

## 2017-01-27 DIAGNOSIS — E43 Unspecified severe protein-calorie malnutrition: Secondary | ICD-10-CM | POA: Diagnosis present

## 2017-01-27 DIAGNOSIS — K851 Biliary acute pancreatitis without necrosis or infection: Secondary | ICD-10-CM | POA: Diagnosis present

## 2017-01-27 DIAGNOSIS — N179 Acute kidney failure, unspecified: Secondary | ICD-10-CM

## 2017-01-27 DIAGNOSIS — J90 Pleural effusion, not elsewhere classified: Secondary | ICD-10-CM | POA: Diagnosis not present

## 2017-01-27 HISTORY — DX: Acute pancreatitis without necrosis or infection, unspecified: K85.90

## 2017-01-27 LAB — CBC
HCT: 32.3 % — ABNORMAL LOW (ref 39.0–52.0)
Hemoglobin: 10.8 g/dL — ABNORMAL LOW (ref 13.0–17.0)
MCH: 30.6 pg (ref 26.0–34.0)
MCHC: 33.4 g/dL (ref 30.0–36.0)
MCV: 91.5 fL (ref 78.0–100.0)
PLATELETS: 404 10*3/uL — AB (ref 150–400)
RBC: 3.53 MIL/uL — AB (ref 4.22–5.81)
RDW: 13.6 % (ref 11.5–15.5)
WBC: 30.1 10*3/uL — AB (ref 4.0–10.5)

## 2017-01-27 LAB — I-STAT CHEM 8, ED
BUN: 14 mg/dL (ref 6–20)
CREATININE: 0.4 mg/dL — AB (ref 0.61–1.24)
Calcium, Ion: 1.09 mmol/L — ABNORMAL LOW (ref 1.15–1.40)
Chloride: 93 mmol/L — ABNORMAL LOW (ref 101–111)
GLUCOSE: 133 mg/dL — AB (ref 65–99)
HCT: 30 % — ABNORMAL LOW (ref 39.0–52.0)
HEMOGLOBIN: 10.2 g/dL — AB (ref 13.0–17.0)
POTASSIUM: 3.6 mmol/L (ref 3.5–5.1)
Sodium: 126 mmol/L — ABNORMAL LOW (ref 135–145)
TCO2: 23 mmol/L (ref 0–100)

## 2017-01-27 LAB — COMPREHENSIVE METABOLIC PANEL
ALK PHOS: 95 U/L (ref 38–126)
ALT: 30 U/L (ref 17–63)
AST: 28 U/L (ref 15–41)
Albumin: 2.3 g/dL — ABNORMAL LOW (ref 3.5–5.0)
Anion gap: 10 (ref 5–15)
BUN: 16 mg/dL (ref 6–20)
CALCIUM: 8.4 mg/dL — AB (ref 8.9–10.3)
CO2: 23 mmol/L (ref 22–32)
CREATININE: 0.52 mg/dL — AB (ref 0.61–1.24)
Chloride: 93 mmol/L — ABNORMAL LOW (ref 101–111)
Glucose, Bld: 136 mg/dL — ABNORMAL HIGH (ref 65–99)
Potassium: 3.7 mmol/L (ref 3.5–5.1)
Sodium: 126 mmol/L — ABNORMAL LOW (ref 135–145)
Total Bilirubin: 1.3 mg/dL — ABNORMAL HIGH (ref 0.3–1.2)
Total Protein: 6.5 g/dL (ref 6.5–8.1)

## 2017-01-27 LAB — LIPASE, BLOOD: Lipase: 27 U/L (ref 11–51)

## 2017-01-27 LAB — I-STAT CG4 LACTIC ACID, ED: Lactic Acid, Venous: 0.7 mmol/L (ref 0.5–1.9)

## 2017-01-27 MED ORDER — IOPAMIDOL (ISOVUE-300) INJECTION 61%
INTRAVENOUS | Status: AC
  Filled 2017-01-27: qty 30

## 2017-01-27 MED ORDER — IOPAMIDOL (ISOVUE-300) INJECTION 61%
100.0000 mL | Freq: Once | INTRAVENOUS | Status: AC | PRN
  Administered 2017-01-27: 100 mL via INTRAVENOUS

## 2017-01-27 MED ORDER — PANTOPRAZOLE SODIUM 40 MG IV SOLR
40.0000 mg | Freq: Every day | INTRAVENOUS | Status: DC
  Administered 2017-01-27 – 2017-01-30 (×4): 40 mg via INTRAVENOUS
  Filled 2017-01-27 (×4): qty 40

## 2017-01-27 MED ORDER — ONDANSETRON HCL 4 MG/2ML IJ SOLN
4.0000 mg | Freq: Once | INTRAMUSCULAR | Status: AC
  Administered 2017-01-27: 4 mg via INTRAVENOUS
  Filled 2017-01-27: qty 2

## 2017-01-27 MED ORDER — IOPAMIDOL (ISOVUE-300) INJECTION 61%
15.0000 mL | Freq: Once | INTRAVENOUS | Status: AC | PRN
  Administered 2017-01-27: 15 mL via ORAL

## 2017-01-27 MED ORDER — PIPERACILLIN-TAZOBACTAM 3.375 G IVPB 30 MIN
3.3750 g | Freq: Once | INTRAVENOUS | Status: AC
  Administered 2017-01-27: 3.375 g via INTRAVENOUS
  Filled 2017-01-27: qty 50

## 2017-01-27 MED ORDER — MORPHINE SULFATE (PF) 4 MG/ML IV SOLN: 2.0000 mg | INTRAVENOUS | Status: DC | PRN

## 2017-01-27 MED ORDER — MOMETASONE FURO-FORMOTEROL FUM 200-5 MCG/ACT IN AERO
2.0000 | INHALATION_SPRAY | Freq: Two times a day (BID) | RESPIRATORY_TRACT | Status: DC
  Administered 2017-01-28 – 2017-02-01 (×10): 2 via RESPIRATORY_TRACT
  Filled 2017-01-27 (×2): qty 8.8

## 2017-01-27 MED ORDER — ONDANSETRON 4 MG PO TBDP: 4.0000 mg | ORAL_TABLET | Freq: Four times a day (QID) | ORAL | Status: DC | PRN

## 2017-01-27 MED ORDER — IOPAMIDOL (ISOVUE-300) INJECTION 61%
INTRAVENOUS | Status: AC
  Filled 2017-01-27: qty 100

## 2017-01-27 MED ORDER — ONDANSETRON HCL 4 MG/2ML IJ SOLN
4.0000 mg | INTRAMUSCULAR | Status: DC | PRN
  Administered 2017-01-27 – 2017-02-01 (×5): 4 mg via INTRAVENOUS
  Filled 2017-01-27 (×5): qty 2

## 2017-01-27 MED ORDER — SODIUM CHLORIDE 0.9 % IV BOLUS (SEPSIS)
1000.0000 mL | Freq: Once | INTRAVENOUS | Status: AC
  Administered 2017-01-27: 1000 mL via INTRAVENOUS

## 2017-01-27 MED ORDER — VANCOMYCIN HCL 10 G IV SOLR
1500.0000 mg | Freq: Once | INTRAVENOUS | Status: AC
  Administered 2017-01-27: 1500 mg via INTRAVENOUS
  Filled 2017-01-27: qty 1500

## 2017-01-27 MED ORDER — VANCOMYCIN HCL IN DEXTROSE 1-5 GM/200ML-% IV SOLN
1000.0000 mg | Freq: Three times a day (TID) | INTRAVENOUS | Status: DC
  Administered 2017-01-27 – 2017-01-28 (×4): 1000 mg via INTRAVENOUS
  Filled 2017-01-27 (×5): qty 200

## 2017-01-27 MED ORDER — PROMETHAZINE HCL 25 MG/ML IJ SOLN
12.5000 mg | Freq: Four times a day (QID) | INTRAMUSCULAR | Status: DC | PRN
  Administered 2017-01-27: 12.5 mg via INTRAVENOUS
  Filled 2017-01-27: qty 1

## 2017-01-27 MED ORDER — LACTATED RINGERS IV SOLN
INTRAVENOUS | Status: DC
  Administered 2017-01-27: 11:00:00 via INTRAVENOUS

## 2017-01-27 MED ORDER — PIPERACILLIN-TAZOBACTAM 3.375 G IVPB
3.3750 g | Freq: Three times a day (TID) | INTRAVENOUS | Status: DC
Start: 2017-01-27 — End: 2017-02-02
  Administered 2017-01-27 – 2017-02-02 (×18): 3.375 g via INTRAVENOUS
  Filled 2017-01-27 (×20): qty 50

## 2017-01-27 MED ORDER — KCL IN DEXTROSE-NACL 20-5-0.9 MEQ/L-%-% IV SOLN
INTRAVENOUS | Status: DC
  Administered 2017-01-27: 15:00:00 via INTRAVENOUS
  Administered 2017-01-28: 100 mL/h via INTRAVENOUS
  Administered 2017-01-28 – 2017-02-01 (×7): via INTRAVENOUS
  Filled 2017-01-27 (×11): qty 1000

## 2017-01-27 MED ORDER — MORPHINE SULFATE (PF) 4 MG/ML IV SOLN
4.0000 mg | Freq: Once | INTRAVENOUS | Status: DC
  Filled 2017-01-27: qty 1

## 2017-01-27 NOTE — ED Notes (Signed)
Dr. Abbey Chatters is seeing him as I write this.

## 2017-01-27 NOTE — H&P (Signed)
LESLY JOSLYN is an 61 y.o. male.   Chief Complaint:   Epigastric pain with persistent nausea and vomiting in somebody with known pancreatic pseudocysts HPI:  This is a 61 year old male admitted to the hospital 12/11/2016 with acute gallstone pancreatitis. He improved enough to undergo a laparoscopic cholecystectomy with operative cholangiogram 12/13/2016.  At the time of surgery, an abscess cavity was noted in the right upper quadrant at the edges liver and this was drained. The operative cholangiogram did not show any evidence of choledocholithiasis or biliary obstruction.  The pancreatitis continued to progress and a CT scan 12/17/2016 demonstrated worsening pancreatitis at that time. With time he did get better and was able to be discharged 12/23/2016.  He was having some abdominal pain with nausea and vomiting and 2 days ago underwent a CT scan, ordered by Dr. Alessandra Bevels, which demonstrated several pseudocyst containing internal gas concerning for multiple peripancreatic abscesses. At the time of admission was suggested to the patient with interventional radiology for percutaneous drainage.  Admission was postponed and the procedure was planned for tomorrow. He had more symptoms over the weekend and presented to the emergency department. He is noted have an elevation of his white blood cell count of 30,000.  Repeat CT today demonstrates similar findings as it did 2 days ago. We were asked to see him and admit him.  Past Medical History:  Diagnosis Date  . Anxiety   . Asthma    uses inhaler  . Pancreatitis     Past Surgical History:  Procedure Laterality Date  . CHOLECYSTECTOMY N/A 12/13/2016   Procedure: LAPAROSCOPIC CHOLECYSTECTOMY WITH INTRAOPERATIVE CHOLANGIOGRAM;  Surgeon: Autumn Messing III, MD;  Location: [facility];  Service: General;  Laterality: N/A;  . GUM SURGERY      Family History  Problem Relation Age of Onset  . Dementia Mother    Social History:  reports that he has never smoked. He  has never used smokeless tobacco. He reports that he drinks about 0.6 oz of alcohol per week . He reports that he does not use drugs.  Allergies: No Known Allergies   (Not in a hospital admission)  Results for orders placed or performed during the hospital encounter of 01/27/17 (from the past 48 hour(s))  Lipase, blood     Status: None   Collection Time: 01/27/17  7:41 AM  Result Value Ref Range   Lipase 27 11 - 51 U/L  Comprehensive metabolic panel     Status: Abnormal   Collection Time: 01/27/17  7:41 AM  Result Value Ref Range   Sodium 126 (L) 135 - 145 mmol/L   Potassium 3.7 3.5 - 5.1 mmol/L   Chloride 93 (L) 101 - 111 mmol/L   CO2 23 22 - 32 mmol/L   Glucose, Bld 136 (H) 65 - 99 mg/dL   BUN 16 6 - 20 mg/dL   Creatinine, Ser 0.52 (L) 0.61 - 1.24 mg/dL   Calcium 8.4 (L) 8.9 - 10.3 mg/dL   Total Protein 6.5 6.5 - 8.1 g/dL   Albumin 2.3 (L) 3.5 - 5.0 g/dL   AST 28 15 - 41 U/L   ALT 30 17 - 63 U/L   Alkaline Phosphatase 95 38 - 126 U/L   Total Bilirubin 1.3 (H) 0.3 - 1.2 mg/dL   GFR calc non Af Amer >60 >60 mL/min   GFR calc Af Amer >60 >60 mL/min    Comment: (NOTE) The eGFR has been calculated using the CKD EPI equation. This calculation has  not been validated in all clinical situations. eGFR's persistently <60 mL/min signify possible Chronic Kidney Disease.    Anion gap 10 5 - 15  CBC     Status: Abnormal   Collection Time: 01/27/17  7:41 AM  Result Value Ref Range   WBC 30.1 (H) 4.0 - 10.5 K/uL   RBC 3.53 (L) 4.22 - 5.81 MIL/uL   Hemoglobin 10.8 (L) 13.0 - 17.0 g/dL   HCT 32.3 (L) 39.0 - 52.0 %   MCV 91.5 78.0 - 100.0 fL   MCH 30.6 26.0 - 34.0 pg   MCHC 33.4 30.0 - 36.0 g/dL   RDW 13.6 11.5 - 15.5 %   Platelets 404 (H) 150 - 400 K/uL  I-Stat Chem 8, ED     Status: Abnormal   Collection Time: 01/27/17  8:08 AM  Result Value Ref Range   Sodium 126 (L) 135 - 145 mmol/L   Potassium 3.6 3.5 - 5.1 mmol/L   Chloride 93 (L) 101 - 111 mmol/L   BUN 14 6 - 20 mg/dL    Creatinine, Ser 0.40 (L) 0.61 - 1.24 mg/dL   Glucose, Bld 133 (H) 65 - 99 mg/dL   Calcium, Ion 1.09 (L) 1.15 - 1.40 mmol/L   TCO2 23 0 - 100 mmol/L   Hemoglobin 10.2 (L) 13.0 - 17.0 g/dL   HCT 30.0 (L) 39.0 - 52.0 %  I-Stat CG4 Lactic Acid, ED     Status: None   Collection Time: 01/27/17  9:33 AM  Result Value Ref Range   Lactic Acid, Venous 0.70 0.5 - 1.9 mmol/L   Ct Abdomen Pelvis W Contrast  Result Date: 01/27/2017 CLINICAL DATA:  Pt spouse reports that pt began having vomiting and abd pain that started suddenly. Pt spouse reports pt had CT scan on Friday and was dx with (multiple, possibly infected) pseudocyst(s) of pancreas and if vomiting or pain worsened to come to ED due to possible rupture. Scheduled for drainage on Monday. EXAM: CT ABDOMEN AND PELVIS WITH CONTRAST TECHNIQUE: Multidetector CT imaging of the abdomen and pelvis was performed using the standard protocol following bolus administration of intravenous contrast. CONTRAST:  67m ISOVUE-300 IOPAMIDOL (ISOVUE-300) INJECTION 61%, 1073mISOVUE-300 IOPAMIDOL (ISOVUE-300) INJECTION 61% COMPARISON:  01/24/2017 FINDINGS: Lower chest: Dependent atelectasis in the lower lobes, left greater than right. Minimal left pleural effusion. Heart is mildly enlarged. Stable appearance when compared to the recent prior study. Hepatobiliary: Liver normal in size. No mass or focal lesion. Gallbladder surgically absent. No bile duct dilation. Pancreas: Homogeneous enhancement pancreas. No pancreatic masses. There peripancreatic fluid collections that are stable from the prior study, described below. Spleen: Low-density lesion noted in the posterior aspect of the spleen measuring 2.4 cm, stable. Linear areas of low density noted in the anterior spleen, also stable. Spleen normal in size. Adrenals/Urinary Tract: Adrenal glands are unremarkable. Kidneys are normal, without renal calculi, focal lesion, or hydronephrosis. Bladder is unremarkable. Stomach/Bowel:  Stomach is moderately distended. There is wall thickening of the distal antrum and pylorus and along the second portion the duodenum that is likely reactive to the adjacent inflammatory changes. Small bowel is normal in caliber with no wall thickening or inflammatory changes. Colon is unremarkable. Normal appendix visualized. Vascular/Lymphatic: Minor infrarenal abdominal aortic atherosclerosis. No aneurysm. No other vascular abnormality. The portal vein, splenic vein and superior mesenteric veins are patent. No adenopathy. Reproductive: Unremarkable. Other: There multiple complex fluid collections fat containing bubbles of air. Collections are seen just below the esophageal hiatus, extending along  the posterior margin of the stomach and adjacent to the distal stomach and duodenum. Complex fluid collections surround the pancreas and extend below this along the small bowel mesenteries posterior to the transverse colon. The largest collection extends along the left pericolic gutter into the left pelvis. This measures 9.7 x 4.9 cm in greatest transverse dimension. At the inferior margin of the spleen, a portion of the collection along the anterior pararenal fascia measures 11 cm x 3.6 cm. These collections are unchanged when compared to the prior CT. There is no free air. Musculoskeletal: No fracture or acute finding. No osteoblastic or osteolytic lesions. IMPRESSION: 1. Multiple fluid collections as detailed above, which containing bubbles of air, unchanged from the most recent prior CT scan. The collections are suspicious for multiple pancreatitis related abscesses. 2. No new abnormalities since the previous exam.  No free air. Electronically Signed   By: Lajean Manes M.D.   On: 01/27/2017 08:59    Review of Systems  Constitutional: Positive for malaise/fatigue and weight loss. Negative for chills and fever.  HENT:       He has been hoarse since his cholecystectomy. The thought is he may have had some vocal  cord irritation during intubation.  Respiratory: Shortness of breath: with exertion.   Gastrointestinal: Positive for abdominal pain, nausea and vomiting. Negative for blood in stool.  Genitourinary: Negative for hematuria.  Musculoskeletal: Positive for back pain.  Neurological: Positive for weakness.  Endo/Heme/Allergies:       No blood clots or bleeding disorders.    Blood pressure 120/76, pulse 85, temperature 97.4 F (36.3 C), temperature source Oral, resp. rate 12, height _0  (1.88 m), weight 76.2 kg (168 lb), SpO2 96 %. Physical Exam   GENERAL APPEARANCE:  Chronically ill appearing male.  Wife in room.  Pleasant and cooperative.  EARS, NOSE, MOUTH THROAT:  [place]/AT external ears:  no lesions or deformities external nose:  no lesions or deformities hearing:  grossly normal lips:  moist, no deformities Voice:  hoarse EYES external: conjunctiva, lids, sclerae normal pupils:  equal, round glasses: no  CV ascultation:  RRR, no murmur extremity edema:  no extremity varicosities:  no  RESP auscultation:  breath sounds equal and clear respiratory effort:  normal   GASTROINTESTINAL abdomen:  Soft, non-tender, non-distended, no masses liver and spleen:  not enlarged. scar:  Multiple small scars   LYMPHATIC: No palpable cervical, supraclavicular adenopathy.  SKIN Jaundice:  none  NEUROLOGIC sensation:  intact to touch speech:  Hoarse, not slurred  PSYCHIATRIC alertness and orientation:  normal mood/affect/behavior:  normal judgement and insight:  normal   Assessment/Plan Complicated gallstone pancreatitis with development of multiple pseudocysts some of which looks like they have become infected. He had plans for interventional radiology to percutaneously drain/aspirate a number of these collections. Dr. Earleen Newport was going to do that tomorrow.  Hyponatremia  Severe PC malnutrition  Plan: Admit to the hospital. IV fluid hydration and workup on correcting  hyponatremia. Broad-spectrum IV antibiotics. I spoke with Dr. Earleen Newport and he will keep him on the schedule for tomorrow morning  Tarry Blayney J, MD 01/27/2017, 11:51 AM

## 2017-01-27 NOTE — ED Notes (Signed)
Pt reminded of need for urine 

## 2017-01-27 NOTE — ED Notes (Signed)
I just gave report to [place], RN on [address] and will transport shortly.

## 2017-01-27 NOTE — Consult Note (Signed)
Referring Provider:   Dr. Avel Peace Primary Care Physician:  Sissy Hoff, MD Primary Gastroenterologist:  Dr. Levora Angel  Reason for Consultation:  Pancreatic pseudocysts  HPI: [name] is a 61 y.o. male being admitted to the hospital because of large volume dark emesis, superimposed on a 6 week history of complicated gallstone pancreatitis.  The patient was admitted to the hospital on March 6 with apparent gallstone pancreatitis, had some initial improvement and underwent laparoscopic cholecystectomy, but then had marked worsening of his pancreatitis requiring roughly another 10 days in the hospital. Since going home thereafter, he has not had significant pain, nor fevers, but has had failure to thrive and, in particular, food avoidance behavior with an astonishing 50 pound weight loss.  An outpatient CT obtained 2 days ago showed multiple fluid collections around the pancreas, and at that time, his white count was almost 24,000, liver chemistries normal. It was advised at that time for him to come into the hospital but he declined, until today when he had the large-volume dark emesis which has not been associated with any drop in hemoglobin compared to several days ago, nor any rise in BUN to suggest GI bleeding.  The patient does report a large amount of "reflux" symptoms since this problem with pancreatitis began. He did not have reflux in the past. He is not taking any medication for it.  The patient was already scheduled for IR percutaneous drainage of the pancreatic fluid collections to be done tomorrow as an outpatient; he has been admitted by the surgeons and that procedure is still planned for tomorrow, as an inpatient.  Over the past 3 days, the patient's white count has gone from 24,000-30,000. His lipase is normal on admission, as are his liver chemistries.  Past Medical History:  Diagnosis Date  . Anxiety   . Asthma    uses inhaler  . Pancreatitis     Past  Surgical History:  Procedure Laterality Date  . CHOLECYSTECTOMY N/A 12/13/2016   Procedure: LAPAROSCOPIC CHOLECYSTECTOMY WITH INTRAOPERATIVE CHOLANGIOGRAM;  Surgeon: Chevis Pretty III, MD;  Location: [facility];  Service: General;  Laterality: N/A;  . GUM SURGERY      Prior to Admission medications   Medication Sig Start Date End Date Taking? Authorizing Provider  acetaminophen (TYLENOL) 500 MG tablet Take 500-1,000 mg by mouth every 6 (six) hours as needed.   Yes Historical Provider, MD  ADVAIR DISKUS 250-50 MCG/DOSE AEPB Inhale 1 puff into the lungs 2 (two) times daily. 11/06/16  Yes Historical Provider, MD  clonazePAM (KLONOPIN) 0.5 MG tablet Take 1.5 mg by mouth at bedtime. 11/08/16  Yes Historical Provider, MD  montelukast (SINGULAIR) 10 MG tablet Take 10 mg by mouth at bedtime. 11/23/16  Yes Historical Provider, MD  sertraline (ZOLOFT) 100 MG tablet Take 200 mg by mouth at bedtime. 11/08/16  Yes Historical Provider, MD    Current Facility-Administered Medications  Medication Dose Route Frequency Provider Last Rate Last Dose  . dextrose 5 % and 0.9 % NaCl with KCl 20 mEq/L infusion   Intravenous Continuous Avel Peace, MD      . iopamidol (ISOVUE-300) 61 % injection           . iopamidol (ISOVUE-300) 61 % injection           . mometasone-formoterol (DULERA) 200-5 MCG/ACT inhaler 2 puff  2 puff Inhalation BID Avel Peace, MD      . morphine 4 MG/ML injection 2-4 mg  2-4 mg Intravenous Q2H PRN Tawanna Cooler  Rosenbower, MD      . ondansetron (ZOFRAN-ODT) disintegrating tablet 4 mg  4 mg Oral Q6H PRN Avel Peace, MD       Or  . ondansetron (ZOFRAN) injection 4 mg  4 mg Intravenous Q4H PRN Avel Peace, MD      . pantoprazole (PROTONIX) injection 40 mg  40 mg Intravenous QHS Avel Peace, MD      . piperacillin-tazobactam (ZOSYN) IVPB 3.375 g  3.375 g Intravenous Q8H Avel Peace, MD      . promethazine (PHENERGAN) injection 12.5 mg  12.5 mg Intravenous Q6H PRN Avel Peace, MD   12.5 mg  at 01/27/17 1238  . vancomycin (VANCOCIN) IVPB 1000 mg/200 mL premix  1,000 mg Intravenous Q8H Avel Peace, MD        Allergies as of 01/27/2017  . (No Known Allergies)    Family History  Problem Relation Age of Onset  . Dementia Mother     Social History   Social History  . Marital status: Married    Spouse name: N/A  . Number of children: N/A  . Years of education: N/A   Occupational History  . Not on file.   Social History Main Topics  . Smoking status: Never Smoker  . Smokeless tobacco: Never Used  . Alcohol use 0.6 oz/week    1 Glasses of wine per week  . Drug use: No  . Sexual activity: Yes   Other Topics Concern  . Not on file   Social History Narrative  . No narrative on file    Review of Systems: Very weak voice ever since his surgery. Can barely talk, almost has to whisper. Also, significant reflux as noted above. No pain, no fevers.  Physical Exam: Vital signs in last 24 hours: Temp:  [97.4 F (36.3 C)] 97.4 F (36.3 C) (04/22 0703) Pulse Rate:  [56-88] 85 (04/22 1052) Resp:  [12-16] 12 (04/22 1052) BP: (120-127)/(75-82) 120/76 (04/22 1052) SpO2:  [96 %-100 %] 96 % (04/22 1052) Weight:  [76.2 kg (168 lb)] 76.2 kg (168 lb) (04/22 0704) Last BM Date: 01/24/17 General:  Almost cachectic-appearing, rather pale, no distress Head:  Normocephalic and atraumatic. Eyes:  Sclera clear, no icterus.   Mouth:   No ulcerations or lesions.  Oropharynx pink & moist. Neck:   No masses or thyromegaly. Lungs:  Clear throughout to auscultation.   No wheezes, crackles, or rhonchi. No evident respiratory distress. Heart:   Regular rate and rhythm; no murmurs, clicks, rubs,  or gallops. Abdomen:  Soft, nontender, nontympanitic, and nondistended. No masses, hepatosplenomegaly or ventral hernias noted.  Msk:   Symmetrical without gross deformities. Extremities:   Without clubbing, cyanosis, or edema. Neurologic:  Alert and coherent;  grossly normal  neurologically. Skin:  Intact without significant lesions or rashes. Cervical Nodes:  No significant cervical adenopathy. Psych:   Alert and cooperative, but appears somewhat withdrawn, and is minimally verbal because of his severe hoarseness. His wife does almost all of the talking for him.  Intake/Output from previous day: No intake/output data recorded. Intake/Output this shift: No intake/output data recorded.  Lab Results:  Recent Labs  01/27/17 0741 01/27/17 0808  WBC 30.1*  --   HGB 10.8* 10.2*  HCT 32.3* 30.0*  PLT 404*  --    BMET  Recent Labs  01/27/17 0741 01/27/17 0808  NA 126* 126*  K 3.7 3.6  CL 93* 93*  CO2 23  --   GLUCOSE 136* 133*  BUN 16 14  CREATININE 0.52* 0.40*  CALCIUM 8.4*  --    LFT  Recent Labs  01/27/17 0741  PROT 6.5  ALBUMIN 2.3*  AST 28  ALT 30  ALKPHOS 95  BILITOT 1.3*   PT/INR No results for input(s): LABPROT, INR in the last 72 hours.  Studies/Results: Ct Abdomen Pelvis W Contrast  Result Date: 01/27/2017 CLINICAL DATA:  Pt spouse reports that pt began having vomiting and abd pain that started suddenly. Pt spouse reports pt had CT scan on Friday and was dx with (multiple, possibly infected) pseudocyst(s) of pancreas and if vomiting or pain worsened to come to ED due to possible rupture. Scheduled for drainage on Monday. EXAM: CT ABDOMEN AND PELVIS WITH CONTRAST TECHNIQUE: Multidetector CT imaging of the abdomen and pelvis was performed using the standard protocol following bolus administration of intravenous contrast. CONTRAST:  15mL ISOVUE-300 IOPAMIDOL (ISOVUE-300) INJECTION 61%, ISOVUE-300 IOPAMIDOL (ISOVUE-300) INJECTION 61% COMPARISON:  01/24/2017 FINDINGS: Lower chest: Dependent atelectasis in the lower lobes, left greater than right. Minimal left pleural effusion. Heart is mildly enlarged. Stable appearance when compared to the recent prior study. Hepatobiliary: Liver normal in size. No mass or focal lesion.  Gallbladder surgically absent. No bile duct dilation. Pancreas: Homogeneous enhancement pancreas. No pancreatic masses. There peripancreatic fluid collections that are stable from the prior study, described below. Spleen: Low-density lesion noted in the posterior aspect of the spleen measuring 2.4 cm, stable. Linear areas of low density noted in the anterior spleen, also stable. Spleen normal in size. Adrenals/Urinary Tract: Adrenal glands are unremarkable. Kidneys are normal, without renal calculi, focal lesion, or hydronephrosis. Bladder is unremarkable. Stomach/Bowel: Stomach is moderately distended. There is wall thickening of the distal antrum and pylorus and along the second portion the duodenum that is likely reactive to the adjacent inflammatory changes. Small bowel is normal in caliber with no wall thickening or inflammatory changes. Colon is unremarkable. Normal appendix visualized. Vascular/Lymphatic: Minor infrarenal abdominal aortic atherosclerosis. No aneurysm. No other vascular abnormality. The portal vein, splenic vein and superior mesenteric veins are patent. No adenopathy. Reproductive: Unremarkable. Other: There multiple complex fluid collections fat containing bubbles of air. Collections are seen just below the esophageal hiatus, extending along the posterior margin of the stomach and adjacent to the distal stomach and duodenum. Complex fluid collections surround the pancreas and extend below this along the small bowel mesenteries posterior to the transverse colon. The largest collection extends along the left pericolic gutter into the left pelvis. This measures 9.7 x 4.9 cm in greatest transverse dimension. At the inferior margin of the spleen, a portion of the collection along the anterior pararenal fascia measures 11 cm x 3.6 cm. These collections are unchanged when compared to the prior CT. There is no free air. Musculoskeletal: No fracture or acute finding. No osteoblastic or osteolytic  lesions. IMPRESSION: 1. Multiple fluid collections as detailed above, which containing bubbles of air, unchanged from the most recent prior CT scan. The collections are suspicious for multiple pancreatitis related abscesses. 2. No new abnormalities since the previous exam.  No free air. Electronically Signed   By: Amie Portland M.D.   On: 01/27/2017 08:59    Impression: 1. Complicated gallstone pancreatitis with fluid collections, possibly low-grade infection as evidenced by white count. 2. Massive weight loss with admission albumin of 2.3 consistent with long-term protein calorie malnutrition 3. Dysphonia following surgery  Plan: Agree with plan for IR drainage tomorrow.  We will follow at a distance with you. Please call if any  specific input from [place] is desired.  I think it will be difficult to know exactly how to advance this patient's diet, and more specifically, whether nutrition supplementation, perhaps even with nasoenteric feedings, will be needed.   LOS: 0 days   Zurisadai Helminiak V  01/27/2017, 2:30 PM   Pager [phone] If no answer or after 5 PM call [phone]

## 2017-01-27 NOTE — ED Provider Notes (Signed)
[facility]-EMERGENCY DEPT Provider Note   CSN: [id number] Arrival date & time: 01/27/17  2836     History   Chief Complaint Chief Complaint  Patient presents with  . Abdominal Pain    HPI [name] is a 61 y.o. male.  61 yo M with a chief complaint of epigastric abdominal pain that radiates to his back. Going on for the past couple weeks or so. Patient had his gallbladder removed and since then has had issues with recurrent pancreatitis. He had a 10 cm pseudocyst that was noted on a CAT scan is scheduled to have that drained tomorrow. His pain got suddenly worse yesterday evening and so the family is concerned that the pseudocyst has ruptured. He also had a large vomiting episode of dark vomitus. Denies dark or bloody stool.   The history is provided by the patient and the spouse.  Abdominal Pain   This is a recurrent problem. The current episode started more than 1 week ago. The problem occurs constantly. The problem has been rapidly worsening. The pain is associated with eating. The pain is located in the epigastric region. The quality of the pain is sharp and shooting. The pain is at a severity of 9/10. The pain is moderate. Pertinent negatives include fever, diarrhea, vomiting, headaches, arthralgias and myalgias. Nothing aggravates the symptoms. Nothing relieves the symptoms. Past workup includes GI consult.    Past Medical History:  Diagnosis Date  . Anxiety   . Asthma    uses inhaler  . Pancreatitis     Patient Active Problem List   Diagnosis Date Noted  . Bilateral pleural effusion 12/20/2016  . Leukocytosis 12/18/2016  . Hypokalemia 12/18/2016  . Thrush 12/18/2016  . Acute gallstone pancreatitis 12/12/2016  . Hyponatremia 12/11/2016  . AKI (acute kidney injury) (HCC) 12/11/2016    Past Surgical History:  Procedure Laterality Date  . CHOLECYSTECTOMY N/A 12/13/2016   Procedure: LAPAROSCOPIC CHOLECYSTECTOMY WITH INTRAOPERATIVE CHOLANGIOGRAM;  Surgeon: Chevis Pretty III,  MD;  Location: [facility];  Service: General;  Laterality: N/A;  . GUM SURGERY         Home Medications    Prior to Admission medications   Medication Sig Start Date End Date Taking? Authorizing Provider  acetaminophen (TYLENOL) 500 MG tablet Take 500-1,000 mg by mouth every 6 (six) hours as needed.   Yes Historical Provider, MD  ADVAIR DISKUS 250-50 MCG/DOSE AEPB Inhale 1 puff into the lungs 2 (two) times daily. 11/06/16  Yes Historical Provider, MD  clonazePAM (KLONOPIN) 0.5 MG tablet Take 1.5 mg by mouth at bedtime. 11/08/16  Yes Historical Provider, MD  montelukast (SINGULAIR) 10 MG tablet Take 10 mg by mouth at bedtime. 11/23/16  Yes Historical Provider, MD  sertraline (ZOLOFT) 100 MG tablet Take 200 mg by mouth at bedtime. 11/08/16  Yes Historical Provider, MD    Family History Family History  Problem Relation Age of Onset  . Dementia Mother     Social History Social History  Substance Use Topics  . Smoking status: Never Smoker  . Smokeless tobacco: Never Used  . Alcohol use 0.6 oz/week    1 Glasses of wine per week     Allergies   Patient has no known allergies.   Review of Systems Review of Systems  Constitutional: Negative for chills and fever.  HENT: Negative for congestion and facial swelling.   Eyes: Negative for discharge and visual disturbance.  Respiratory: Negative for shortness of breath.   Cardiovascular: Negative for chest pain and  palpitations.  Gastrointestinal: Positive for abdominal pain. Negative for diarrhea and vomiting.  Musculoskeletal: Positive for back pain. Negative for arthralgias and myalgias.  Skin: Negative for color change and rash.  Neurological: Negative for tremors, syncope and headaches.  Psychiatric/Behavioral: Negative for confusion and dysphoric mood.     Physical Exam Updated Vital Signs BP 122/75 (BP Location: Left Arm)   Pulse 88   Temp 97.4 F (36.3 C) (Oral)   Resp 12   Ht  (1.88 m)   Wt 168 lb (76.2 kg)   SpO2  96%   BMI 21.57 kg/m   Physical Exam  Constitutional: He is oriented to person, place, and time. He appears cachectic.  HENT:  Head: Normocephalic and atraumatic.  Eyes: EOM are normal. Pupils are equal, round, and reactive to light.  Neck: Normal range of motion. Neck supple. No JVD present.  Cardiovascular: Normal rate and regular rhythm.  Exam reveals no gallop and no friction rub.   No murmur heard. Pulmonary/Chest: No respiratory distress. He has no wheezes.  Abdominal: He exhibits no distension. There is no rebound and no guarding.  Diffuse pain worse in the epigastrium  Musculoskeletal: Normal range of motion.  Neurological: He is alert and oriented to person, place, and time.  Skin: No rash noted. No pallor.  Psychiatric: He has a normal mood and affect. His behavior is normal.  Nursing note and vitals reviewed.    ED Treatments / Results  Labs (all labs ordered are listed, but only abnormal results are displayed) Labs Reviewed  COMPREHENSIVE METABOLIC PANEL - Abnormal; Notable for the following:       Result Value   Sodium 126 (*)    Chloride 93 (*)    Glucose, Bld 136 (*)    Creatinine, Ser 0.52 (*)    Calcium 8.4 (*)    Albumin 2.3 (*)    Total Bilirubin 1.3 (*)    All other components within normal limits  CBC - Abnormal; Notable for the following:    WBC 30.1 (*)    RBC 3.53 (*)    Hemoglobin 10.8 (*)    HCT 32.3 (*)    Platelets 404 (*)    All other components within normal limits  I-STAT CHEM 8, ED - Abnormal; Notable for the following:    Sodium 126 (*)    Chloride 93 (*)    Creatinine, Ser 0.40 (*)    Glucose, Bld 133 (*)    Calcium, Ion 1.09 (*)    Hemoglobin 10.2 (*)    HCT 30.0 (*)    All other components within normal limits  LIPASE, BLOOD  URINALYSIS, ROUTINE W REFLEX MICROSCOPIC  I-STAT CG4 LACTIC ACID, ED    EKG  EKG Interpretation None       Radiology Ct Abdomen Pelvis W Contrast  Result Date: 01/27/2017 CLINICAL DATA:  Pt  spouse reports that pt began having vomiting and abd pain that started suddenly. Pt spouse reports pt had CT scan on Friday and was dx with (multiple, possibly infected) pseudocyst(s) of pancreas and if vomiting or pain worsened to come to ED due to possible rupture. Scheduled for drainage on Monday. EXAM: CT ABDOMEN AND PELVIS WITH CONTRAST TECHNIQUE: Multidetector CT imaging of the abdomen and pelvis was performed using the standard protocol following bolus administration of intravenous contrast. CONTRAST:  15mL ISOVUE-300 IOPAMIDOL (ISOVUE-300) INJECTION 61%, ISOVUE-300 IOPAMIDOL (ISOVUE-300) INJECTION 61% COMPARISON:  01/24/2017 FINDINGS: Lower chest: Dependent atelectasis in the lower lobes, left greater than  right. Minimal left pleural effusion. Heart is mildly enlarged. Stable appearance when compared to the recent prior study. Hepatobiliary: Liver normal in size. No mass or focal lesion. Gallbladder surgically absent. No bile duct dilation. Pancreas: Homogeneous enhancement pancreas. No pancreatic masses. There peripancreatic fluid collections that are stable from the prior study, described below. Spleen: Low-density lesion noted in the posterior aspect of the spleen measuring 2.4 cm, stable. Linear areas of low density noted in the anterior spleen, also stable. Spleen normal in size. Adrenals/Urinary Tract: Adrenal glands are unremarkable. Kidneys are normal, without renal calculi, focal lesion, or hydronephrosis. Bladder is unremarkable. Stomach/Bowel: Stomach is moderately distended. There is wall thickening of the distal antrum and pylorus and along the second portion the duodenum that is likely reactive to the adjacent inflammatory changes. Small bowel is normal in caliber with no wall thickening or inflammatory changes. Colon is unremarkable. Normal appendix visualized. Vascular/Lymphatic: Minor infrarenal abdominal aortic atherosclerosis. No aneurysm. No other vascular abnormality. The portal  vein, splenic vein and superior mesenteric veins are patent. No adenopathy. Reproductive: Unremarkable. Other: There multiple complex fluid collections fat containing bubbles of air. Collections are seen just below the esophageal hiatus, extending along the posterior margin of the stomach and adjacent to the distal stomach and duodenum. Complex fluid collections surround the pancreas and extend below this along the small bowel mesenteries posterior to the transverse colon. The largest collection extends along the left pericolic gutter into the left pelvis. This measures 9.7 x 4.9 cm in greatest transverse dimension. At the inferior margin of the spleen, a portion of the collection along the anterior pararenal fascia measures 11 cm x 3.6 cm. These collections are unchanged when compared to the prior CT. There is no free air. Musculoskeletal: No fracture or acute finding. No osteoblastic or osteolytic lesions. IMPRESSION: 1. Multiple fluid collections as detailed above, which containing bubbles of air, unchanged from the most recent prior CT scan. The collections are suspicious for multiple pancreatitis related abscesses. 2. No new abnormalities since the previous exam.  No free air. Electronically Signed   By: Amie Portland M.D.   On: 01/27/2017 08:59    Procedures Procedures (including critical care time)  Medications Ordered in ED Medications  morphine 4 MG/ML injection 4 mg (4 mg Intravenous Not Given 01/27/17 0826)  iopamidol (ISOVUE-300) 61 % injection (not administered)  iopamidol (ISOVUE-300) 61 % injection (not administered)  vancomycin (VANCOCIN) 1,500 mg in sodium chloride 0.9 % 500 mL IVPB (not administered)  lactated ringers infusion (not administered)  iopamidol (ISOVUE-300) 61 % injection 15 mL (15 mLs Oral Contrast Given 01/27/17 0824)  ondansetron (ZOFRAN) injection 4 mg (4 mg Intravenous Given 01/27/17 0826)  sodium chloride 0.9 % bolus 1,000 mL (0 mLs Intravenous Stopped 01/27/17 0850)    iopamidol (ISOVUE-300) 61 % injection 100 mL (100 mLs Intravenous Contrast Given 01/27/17 0836)  piperacillin-tazobactam (ZOSYN) IVPB 3.375 g (3.375 g Intravenous New Bag/Given 01/27/17 0948)  sodium chloride 0.9 % bolus 1,000 mL (1,000 mLs Intravenous New Bag/Given 01/27/17 1001)     Initial Impression / Assessment and Plan / ED Course  I have reviewed the triage vital signs and the nursing notes.  Pertinent labs & imaging results that were available during my care of the patient were reviewed by me and considered in my medical decision making (see chart for details).     61 yo M with a chief complaint of epigastric abdominal pain. Patient has a history of pancreatitis after having his gallbladder removed. He  had an episode of what sounded like possibly bloody emesis. Patient is chronically ill appearing though has no past medical history other than this. Will obtain a CT scan of the abdomen and pelvis contrast.  CT with multiple fluid collections running the pancreas. Thought by radiology be likely abscess. Patient does have an escalating white count lactate is normal no fevers here. Discussed the case with Dr. Matthias Hughs, GI, recommend involvement of surgery for likely needing IR drainage.  Discussed with Gen Surgery Dr. Abbey Chatters will eval the patient. Likely take to the OR this evening.  The patients results and plan were reviewed and discussed.   Any x-rays performed were independently reviewed by myself.   Differential diagnosis were considered with the presenting HPI.  Medications  morphine 4 MG/ML injection 4 mg (4 mg Intravenous Not Given 01/27/17 0826)  iopamidol (ISOVUE-300) 61 % injection (not administered)  iopamidol (ISOVUE-300) 61 % injection (not administered)  vancomycin (VANCOCIN) 1,500 mg in sodium chloride 0.9 % 500 mL IVPB (not administered)  lactated ringers infusion (not administered)  iopamidol (ISOVUE-300) 61 % injection 15 mL (15 mLs Oral Contrast Given 01/27/17  0824)  ondansetron (ZOFRAN) injection 4 mg (4 mg Intravenous Given 01/27/17 0826)  sodium chloride 0.9 % bolus 1,000 mL (0 mLs Intravenous Stopped 01/27/17 0850)  iopamidol (ISOVUE-300) 61 % injection 100 mL (100 mLs Intravenous Contrast Given 01/27/17 0836)  piperacillin-tazobactam (ZOSYN) IVPB 3.375 g (3.375 g Intravenous New Bag/Given 01/27/17 0948)  sodium chloride 0.9 % bolus 1,000 mL (1,000 mLs Intravenous New Bag/Given 01/27/17 1001)    Vitals:   01/27/17 0703 01/27/17 0704 01/27/17 0938  BP: 127/82  122/75  Pulse: (!) 56  88  Resp: 16  12  Temp: 97.4 F (36.3 C)    TempSrc: Oral    SpO2: 100%  96%  Weight:  168 lb (76.2 kg)   Height:   (1.88 m)     Final diagnoses:  Pancreatic abscess    Admission/ observation were discussed with the admitting physician, patient and/or family and they are comfortable with the plan.    Final Clinical Impressions(s) / ED Diagnoses   Final diagnoses:  Pancreatic abscess    New Prescriptions New Prescriptions   No medications on file     Melene Plan, DO 01/27/17 1018

## 2017-01-27 NOTE — ED Notes (Signed)
Pt given urinal and made aware of need for specimen  

## 2017-01-27 NOTE — ED Triage Notes (Signed)
Pt spouse reports that pt began having vomiting and abd pain that started suddenly. Pt spouse reports pt had CT scan on Friday and was dx with pseudocyst of pancreas and if vomiting or pain worsened to come to ED due to possible rupture.

## 2017-01-27 NOTE — ED Notes (Signed)
Note: I draw a blood culture before antibiotic given.

## 2017-01-27 NOTE — Progress Notes (Signed)
Pharmacy Antibiotic Note  [name] is a 61 y.o. male admitted on 01/27/2017 with complex intraabdominal infection.  Pharmacy has been consulted for vancomycin/zosyn dosing. Epigastric pain with persistent nausea and vomiting in somebody with known pancreatic pseudocysts. interventional radiology to percutaneously drain/aspirate a number of these collections tomorrow. WBC 10.3, creat 0.4, lactate WNL, AF. Wt 76.2 kg.  He has already received vanc 1500 mg in ED at 1050 am and zosyn 3.375 gm at 948 am.  Plan: Vancomycin 1000 mg IV every 8 hours.  Goal trough 15-20 mcg/mL. Zosyn 3.375g IV q8h (4 hour infusion).  f/u renal fxn, wbc, temp, culture data Vancomycin level as needed  Height:  (188 cm) Weight: 168 lb (76.2 kg) IBW/kg (Calculated) : 82.2  Temp (24hrs), Avg:97.4 F (36.3 C), Min:97.4 F (36.3 C), Max:97.4 F (36.3 C)   Recent Labs Lab 01/27/17 0741 01/27/17 0808 01/27/17 0933  WBC 30.1*  --   --   CREATININE 0.52* 0.40*  --   LATICACIDVEN  --   --  0.70    Estimated Creatinine Clearance: 103.2 mL/min (A) (by C-G formula based on SCr of 0.4 mg/dL (L)).    No Known Allergies  Antimicrobials this admission: vanc 4/22>> Zosyn 4/11>>  Dose adjustments this admission:   Microbiology results:   Thank you for allowing pharmacy to be a part of this patient's care.  Herby Abraham, Pharm.D. [phone] 01/27/2017 11:54 AM

## 2017-01-28 ENCOUNTER — Inpatient Hospital Stay (HOSPITAL_COMMUNITY)
Admission: RE | Admit: 2017-01-28 | Discharge: 2017-01-28 | Disposition: A | Discharge status: Home / Self Care | Payer: BC Managed Care – PPO | Source: Ambulatory Visit | Attending: Gastroenterology | Admitting: Gastroenterology

## 2017-01-28 ENCOUNTER — Inpatient Hospital Stay (HOSPITAL_COMMUNITY): Payer: BC Managed Care – PPO

## 2017-01-28 ENCOUNTER — Encounter (HOSPITAL_COMMUNITY): Payer: Self-pay

## 2017-01-28 DIAGNOSIS — K863 Pseudocyst of pancreas: Secondary | ICD-10-CM

## 2017-01-28 LAB — CBC
HCT: 26.7 % — ABNORMAL LOW (ref 39.0–52.0)
Hemoglobin: 8.8 g/dL — ABNORMAL LOW (ref 13.0–17.0)
MCH: 29.5 pg (ref 26.0–34.0)
MCHC: 33 g/dL (ref 30.0–36.0)
MCV: 89.6 fL (ref 78.0–100.0)
Platelets: 315 10*3/uL (ref 150–400)
RBC: 2.98 MIL/uL — ABNORMAL LOW (ref 4.22–5.81)
RDW: 13.4 % (ref 11.5–15.5)
WBC: 15.6 10*3/uL — ABNORMAL HIGH (ref 4.0–10.5)

## 2017-01-28 LAB — HIV ANTIBODY (ROUTINE TESTING W REFLEX): HIV SCREEN 4TH GENERATION: NONREACTIVE

## 2017-01-28 LAB — URINALYSIS, ROUTINE W REFLEX MICROSCOPIC
BILIRUBIN URINE: NEGATIVE
Glucose, UA: NEGATIVE mg/dL
Hgb urine dipstick: NEGATIVE
Ketones, ur: NEGATIVE mg/dL
Leukocytes, UA: NEGATIVE
NITRITE: NEGATIVE
Protein, ur: NEGATIVE mg/dL
SPECIFIC GRAVITY, URINE: 1.006 (ref 1.005–1.030)
pH: 6 (ref 5.0–8.0)

## 2017-01-28 LAB — BASIC METABOLIC PANEL
ANION GAP: 6 (ref 5–15)
BUN: 9 mg/dL (ref 6–20)
CALCIUM: 7.9 mg/dL — AB (ref 8.9–10.3)
CO2: 25 mmol/L (ref 22–32)
Chloride: 103 mmol/L (ref 101–111)
Creatinine, Ser: 0.62 mg/dL (ref 0.61–1.24)
GFR calc Af Amer: >60 mL/min (ref >60)
GLUCOSE: 118 mg/dL — AB (ref 65–99)
Potassium: 3.5 mmol/L (ref 3.5–5.1)
SODIUM: 134 mmol/L — AB (ref 135–145)

## 2017-01-28 LAB — VANCOMYCIN, TROUGH: Vancomycin Tr: 13 ug/mL — ABNORMAL LOW (ref 15–20)

## 2017-01-28 MED ORDER — MIDAZOLAM HCL 2 MG/2ML IJ SOLN
INTRAMUSCULAR | Status: AC | PRN
  Administered 2017-01-28 (×3): 1 mg via INTRAVENOUS

## 2017-01-28 MED ORDER — FENTANYL CITRATE (PF) 100 MCG/2ML IJ SOLN
INTRAMUSCULAR | Status: AC
  Filled 2017-01-28: qty 4

## 2017-01-28 MED ORDER — VANCOMYCIN HCL 10 G IV SOLR
1250.0000 mg | Freq: Three times a day (TID) | INTRAVENOUS | Status: DC
  Administered 2017-01-29 (×2): 1250 mg via INTRAVENOUS
  Filled 2017-01-28 (×2): qty 1250

## 2017-01-28 MED ORDER — MIDAZOLAM HCL 2 MG/2ML IJ SOLN
INTRAMUSCULAR | Status: AC
  Filled 2017-01-28: qty 6

## 2017-01-28 MED ORDER — FENTANYL CITRATE (PF) 100 MCG/2ML IJ SOLN
INTRAMUSCULAR | Status: AC | PRN
  Administered 2017-01-28: 25 ug via INTRAVENOUS
  Administered 2017-01-28 (×2): 50 ug via INTRAVENOUS

## 2017-01-28 MED ORDER — SERTRALINE HCL 100 MG PO TABS
200.0000 mg | ORAL_TABLET | Freq: Every day | ORAL | Status: DC
  Administered 2017-01-28 – 2017-02-01 (×5): 200 mg via ORAL
  Filled 2017-01-28 (×5): qty 2

## 2017-01-28 MED ORDER — ACETAMINOPHEN 325 MG PO TABS
650.0000 mg | ORAL_TABLET | Freq: Four times a day (QID) | ORAL | Status: DC | PRN
  Administered 2017-01-28 – 2017-02-02 (×8): 650 mg via ORAL
  Filled 2017-01-28 (×8): qty 2

## 2017-01-28 MED ORDER — CLONAZEPAM 1 MG PO TABS
1.5000 mg | ORAL_TABLET | Freq: Every day | ORAL | Status: DC
  Administered 2017-01-28 – 2017-02-01 (×5): 1.5 mg via ORAL
  Filled 2017-01-28 (×5): qty 1

## 2017-01-28 MED ORDER — MONTELUKAST SODIUM 10 MG PO TABS
10.0000 mg | ORAL_TABLET | Freq: Every day | ORAL | Status: DC
  Administered 2017-01-28 – 2017-02-01 (×5): 10 mg via ORAL
  Filled 2017-01-28 (×5): qty 1

## 2017-01-28 MED ORDER — SODIUM CHLORIDE 0.9 % IV SOLN
INTRAVENOUS | Status: AC
  Filled 2017-01-28: qty 500

## 2017-01-28 NOTE — Sedation Documentation (Signed)
Patient is resting comfortably. 

## 2017-01-28 NOTE — Progress Notes (Signed)
Pharmacy Antibiotic Note  [name] is a 61 y.o. male admitted on 01/27/2017 with complex intra-abdominal infection.  Pharmacy has been consulted for vancomycin and zosyn dosing. Patient presented with epigastric pain with persistent nausea and vomiting in somebody with known pancreatic pseudocysts. Patient underwent percutaneous drainage by IR today. Vancomycin trough returned subtherapeutic at 13 mcg/mL. Renal function currently fairly stable.   Plan: Increase Vancomycin to 1250 mg IV q8h. Goal trough 15-20 mcg/mL. Plan for repeat Vancomycin trough level at new steady state, as clinically warranted.  Continue Zosyn 3.375g IV q8h (4 hour infusion).  Monitor renal function, cultures, clinical course.   Height:  (188 cm) Weight: 168 lb (76.2 kg) IBW/kg (Calculated) : 82.2  Temp (24hrs), Avg:98.1 F (36.7 C), Min:98 F (36.7 C), Max:98.5 F (36.9 C)   Recent Labs Lab 01/27/17 0741 01/27/17 0808 01/27/17 0933 01/28/17 0434 01/28/17 1731  WBC 30.1*  --   --  15.6*  --   CREATININE 0.52* 0.40*  --  0.62  --   LATICACIDVEN  --   --  0.70  --   --   VANCOTROUGH  --   --   --   --  13*    Estimated Creatinine Clearance: 103.2 mL/min (by C-G formula based on SCr of 0.62 mg/dL).    No Known Allergies  Antimicrobials this admission: Vanc 4/22 >> Zosyn 4/22 >>  Dose adjustments this admission: 4/23 VT at 1731 = 13 mcg/mL, dose increased from 1g q8h to  q8h  Microbiology results: 4/23 Pancreas abscess: moderate gram negative rods, gram positive cocci in pairs and chains  Thank you for allowing pharmacy to be a part of this patient's care.   Greer Pickerel, PharmD, BCPS Pager: [phone] 01/28/2017 7:55 PM

## 2017-01-28 NOTE — Consult Note (Signed)
Chief Complaint: Patient was seen in consultation today for pancreatic pseudocysts  Referring Physician(s): Brahmbhatt,Parag  Supervising Physician: Jolaine Click  Patient Status: [facility] - In-pt  History of Present Illness: [name] is a 61 y.o. male with history of acute gallstone pancreatitis s/p lap chole 12/13/16 with abscess cavity drainage at the edges of the liver.  Patient developed pancreatitis post-op which improved and he was discharged home, however he continued with poor PO intake and weight loss.  Patient then developed acute nausea and vomiting at home for the past 2 days and presented to his GI office.     CT Abd/Pelvis 4/19: Mild improvement in acute pancreatitis since prior study. Increased multiple organized pseudocysts within the abdomen, several of which contain internal gas. Infected pseudocysts cannot be excluded. Decreased small bilateral pleural effusions and bibasilar atelectasis.  IR was consulted for aspiration and drainage.  Case was discussed with Dr. Loreta Ave who approved patient for procedure which was planned for today as an outpatient, however he had more symptoms over the weekend and presented to the emergency department with a WBC count of 30K.     Past Medical History:  Diagnosis Date  . Anxiety   . Asthma    uses inhaler  . Pancreatitis     Past Surgical History:  Procedure Laterality Date  . CHOLECYSTECTOMY N/A 12/13/2016   Procedure: LAPAROSCOPIC CHOLECYSTECTOMY WITH INTRAOPERATIVE CHOLANGIOGRAM;  Surgeon: Chevis Pretty III, MD;  Location: [facility] ORS;  Service: General;  Laterality: N/A;  . GUM SURGERY      Allergies: Patient has no known allergies.  Medications: Prior to Admission medications   Medication Sig Start Date End Date Taking? Authorizing Provider  acetaminophen (TYLENOL) 500 MG tablet Take 500-1,000 mg by mouth every 6 (six) hours as needed.    Historical Provider, MD  ADVAIR DISKUS 250-50 MCG/DOSE AEPB Inhale 1 puff into the  lungs 2 (two) times daily. 11/06/16   Historical Provider, MD  clonazePAM (KLONOPIN) 0.5 MG tablet Take 1.5 mg by mouth at bedtime. 11/08/16   Historical Provider, MD  montelukast (SINGULAIR) 10 MG tablet Take 10 mg by mouth at bedtime. 11/23/16   Historical Provider, MD  sertraline (ZOLOFT) 100 MG tablet Take 200 mg by mouth at bedtime. 11/08/16   Historical Provider, MD     Family History  Problem Relation Age of Onset  . Dementia Mother     Social History   Social History  . Marital status: Married    Spouse name: N/A  . Number of children: N/A  . Years of education: N/A   Social History Main Topics  . Smoking status: Never Smoker  . Smokeless tobacco: Never Used  . Alcohol use 0.6 oz/week    1 Glasses of wine per week  . Drug use: No  . Sexual activity: Yes   Other Topics Concern  . Not on file   Social History Narrative  . No narrative on file    Review of Systems  Constitutional: Negative for fatigue and fever.  Respiratory: Negative for cough and shortness of breath.   Cardiovascular: Negative for chest pain.  Gastrointestinal: Positive for abdominal pain, nausea and vomiting.  Psychiatric/Behavioral: Negative for behavioral problems and confusion.    Vital Signs: There were no vitals taken for this visit.  Physical Exam  Constitutional: He is oriented to person, place, and time. He appears well-developed.  Evidence of muscle and subcutaneous muscle wasting.  Cardiovascular: Normal rate, regular rhythm and normal heart sounds.  Pulmonary/Chest: Effort normal and breath sounds normal. No respiratory distress.  Abdominal: Soft. He exhibits no distension. There is no tenderness.  Neurological: He is alert and oriented to person, place, and time.  Skin: Skin is warm and dry.  Psychiatric: He has a normal mood and affect. His behavior is normal. Judgment and thought content normal.  Nursing note and vitals reviewed.   Mallampati Score:  MD Evaluation Airway:  WNL Heart: WNL Abdomen: WNL Chest/ Lungs: WNL ASA  Classification: 3 Mallampati/Airway Score: Two  Imaging: Ct Abdomen W Contrast  Result Date: 01/24/2017 CLINICAL DATA:  Followup acute biliary pancreatitis. Approximately 1 month status post cholecystectomy. EXAM: CT ABDOMEN WITH CONTRAST TECHNIQUE: Multidetector CT imaging of the abdomen was performed using the standard protocol following bolus administration of intravenous contrast. CONTRAST:  ISOVUE-300 IOPAMIDOL (ISOVUE-300) INJECTION 61% COMPARISON:  12/17/2016 FINDINGS: Lower chest: Interval decrease in small bilateral pleural effusions and bibasilar atelectasis. Hepatobiliary: No masses identified. Prior cholecystectomy noted. No evidence of biliary dilatation. Perihepatic drain has been removed since previous study. Pancreas: Mild decrease in diffuse pancreatic edema is seen since previous study. Multiple rim enhancing fluid collections are seen which show increased size and organization compared to previous studies, consistent with multiple pseudocysts. Several of these fluid collections contain internal gas, and infected pseudocysts cannot be excluded. Largest collection adjacent to pancreatic tail and extending inferiorly in the left abdomen along Gerota's fascia measures 10 x 13 cm. Spleen: Stable small peripheral low-attenuation lesions which may represent pseudocyst or lymphangioma as. No evidence of splenomegaly. Adrenals/Urinary Tract: No masses identified. No evidence of hydronephrosis. Stomach/Bowel: Stable gastric wall thickening attributable to pancreatitis. Vascular/Lymphatic: No pathologically enlarged lymph nodes identified. No abdominal aortic aneurysm. Other:  None. Musculoskeletal:  No suspicious bone lesions identified. IMPRESSION: Mild improvement in acute pancreatitis since prior study. Increased multiple organized pseudocysts within the abdomen, several of which contain internal gas. Infected pseudocysts cannot be  excluded. Decreased small bilateral pleural effusions and bibasilar atelectasis. Electronically Signed   By: Myles Rosenthal M.D.   On: 01/24/2017 09:08   Ct Abdomen Pelvis W Contrast  Result Date: 01/27/2017 CLINICAL DATA:  Pt spouse reports that pt began having vomiting and abd pain that started suddenly. Pt spouse reports pt had CT scan on Friday and was dx with (multiple, possibly infected) pseudocyst(s) of pancreas and if vomiting or pain worsened to come to ED due to possible rupture. Scheduled for drainage on Monday. EXAM: CT ABDOMEN AND PELVIS WITH CONTRAST TECHNIQUE: Multidetector CT imaging of the abdomen and pelvis was performed using the standard protocol following bolus administration of intravenous contrast. CONTRAST:  15mL ISOVUE-300 IOPAMIDOL (ISOVUE-300) INJECTION 61%, ISOVUE-300 IOPAMIDOL (ISOVUE-300) INJECTION 61% COMPARISON:  01/24/2017 FINDINGS: Lower chest: Dependent atelectasis in the lower lobes, left greater than right. Minimal left pleural effusion. Heart is mildly enlarged. Stable appearance when compared to the recent prior study. Hepatobiliary: Liver normal in size. No mass or focal lesion. Gallbladder surgically absent. No bile duct dilation. Pancreas: Homogeneous enhancement pancreas. No pancreatic masses. There peripancreatic fluid collections that are stable from the prior study, described below. Spleen: Low-density lesion noted in the posterior aspect of the spleen measuring 2.4 cm, stable. Linear areas of low density noted in the anterior spleen, also stable. Spleen normal in size. Adrenals/Urinary Tract: Adrenal glands are unremarkable. Kidneys are normal, without renal calculi, focal lesion, or hydronephrosis. Bladder is unremarkable. Stomach/Bowel: Stomach is moderately distended. There is wall thickening of the distal antrum and pylorus and along the second portion  the duodenum that is likely reactive to the adjacent inflammatory changes. Small bowel is normal in caliber  with no wall thickening or inflammatory changes. Colon is unremarkable. Normal appendix visualized. Vascular/Lymphatic: Minor infrarenal abdominal aortic atherosclerosis. No aneurysm. No other vascular abnormality. The portal vein, splenic vein and superior mesenteric veins are patent. No adenopathy. Reproductive: Unremarkable. Other: There multiple complex fluid collections fat containing bubbles of air. Collections are seen just below the esophageal hiatus, extending along the posterior margin of the stomach and adjacent to the distal stomach and duodenum. Complex fluid collections surround the pancreas and extend below this along the small bowel mesenteries posterior to the transverse colon. The largest collection extends along the left pericolic gutter into the left pelvis. This measures 9.7 x 4.9 cm in greatest transverse dimension. At the inferior margin of the spleen, a portion of the collection along the anterior pararenal fascia measures 11 cm x 3.6 cm. These collections are unchanged when compared to the prior CT. There is no free air. Musculoskeletal: No fracture or acute finding. No osteoblastic or osteolytic lesions. IMPRESSION: 1. Multiple fluid collections as detailed above, which containing bubbles of air, unchanged from the most recent prior CT scan. The collections are suspicious for multiple pancreatitis related abscesses. 2. No new abnormalities since the previous exam.  No free air. Electronically Signed   By: Amie Portland M.D.   On: 01/27/2017 08:59    Labs:  CBC:  Recent Labs  12/22/16 0417 12/23/16 0408 01/27/17 0741 01/27/17 0808 01/28/17 0434  WBC 21.1* 18.4* 30.1*  --  15.6*  HGB 11.7* 10.7* 10.8* 10.2* 8.8*  HCT 33.4* 30.6* 32.3* 30.0* 26.7*  PLT 313 354 404*  --  315    COAGS: No results for input(s): INR, APTT in the last 8760 hours.  BMP:  Recent Labs  12/22/16 0417 12/23/16 0408 01/27/17 0741 01/27/17 0808 01/28/17 0434  NA 131* 132* 126* 126* 134*  K  3.6 3.7 3.7 3.6 3.5  CL 98* 100* 93* 93* 103  CO2 --  25  GLUCOSE 111* 109* 136* 133* 118*  BUN CALCIUM 7.5* 7.7* 8.4*  --  7.9*  CREATININE 0.67 0.64 0.52* 0.40* 0.62  GFRNONAA >60 >60 >60  --  >60  GFRAA >60 >60 >60  --  >60    LIVER FUNCTION TESTS:  Recent Labs  12/18/16 0443 12/21/16 0411 12/22/16 0417 01/27/17 0741  BILITOT 2.0* 1.7* 1.4* 1.3*  AST 26 44* 29 28  ALT 24 44 37 30  ALKPHOS 67 89 96 95  PROT 4.8* 5.3* 4.9* 6.5  ALBUMIN 1.9* 1.9* 1.8* 2.3*    TUMOR MARKERS: No results for input(s): AFPTM, CEA, CA199, CHROMGRNA in the last 8760 hours.  Assessment and Plan: Pancreatic pseudocyst Patient admitted with pancreatic pseudocysts identified by CT imaging.  Plan was for percutaneous drainage as an output, however patient became symptomatic over the weekend and presented to [facility] ED with nausea and vomiting. Will proceed with procedure today. Risks and Benefits discussed with the patient including bleeding, infection, damage to adjacent structures, bowel perforation/fistula connection, and sepsis. All of the patient's questions were answered, patient is agreeable to proceed. Consent signed and in chart.   Thank you for this interesting consult.  I greatly enjoyed meeting OLAN KUREK and look forward to participating in their care.  A copy of this report was sent to the requesting provider on this date.  Electronically Signed: Senaida Ores  Ashley Royalty 01/28/2017, 8:56 AM   I spent a total of 40 Minutes    in face to face in clinical consultation, greater than 50% of which was counseling/coordinating care for psuedocysts.

## 2017-01-28 NOTE — Procedures (Signed)
Pancreatic abscess drain  12 Fr Pus EBL 0 Comp 0

## 2017-01-28 NOTE — Sedation Documentation (Signed)
Patient is resting comfortably.  Denies pain.

## 2017-01-28 NOTE — Progress Notes (Signed)
Chief complaint history gallstone pancreatitis with pancreatic pseudocyst postop     Subjective: Patient is just back from IR. Drain on the left side with bloody purulent fluid, and a gravity bag.  Objective: Vital signs in last 24 hours: Temp:  [98.1 F (36.7 C)-99.1 F (37.3 C)] 98.1 F (36.7 C) (04/23 0544) Pulse Rate:  [81-91] 85 (04/23 0951) Resp:  [12-26] 18 (04/23 0951) BP: (97-121)/(63-77) 97/64 (04/23 0951) SpO2:  [96 %-99 %] 96 % (04/23 0951) Last BM Date: 01/26/17 NPO 1000 IV Urine 1000 Afebrile, VSS BMP OK WBC better, H/H is down    Intake/Output from previous day: 04/22 0701 - 04/23 0700 In: 976.7 [I.V.:876.7; IV Piggyback:100] Out: 1000 [Urine:1000] Intake/Output this shift: No intake/output data recorded.  General appearance: alert, cooperative and Chronically ill-appearing, major weight loss. Throat: lips, mucosa, and tongue normal; teeth and gums normal and He still has white exudate on his tongue. He still does not have a voice and has been seen by ENT. Resp: clear to auscultation bilaterally GI: Soft, few bowel sounds, port sites all look good. No distention, no tenderness on palpation. Left sided drain with bloody/purulent material  Lab Results:   Recent Labs  01/27/17 0741 01/27/17 0808 01/28/17 0434  WBC 30.1*  --  15.6*  HGB 10.8* 10.2* 8.8*  HCT 32.3* 30.0* 26.7*  PLT 404*  --  315    BMET  Recent Labs  01/27/17 0741 01/27/17 0808 01/28/17 0434  NA 126* 126* 134*  K 3.7 3.6 3.5  CL 93* 93* 103  CO2 23  --  25  GLUCOSE 136* 133* 118*  BUN CREATININE 0.52* 0.40* 0.62  CALCIUM 8.4*  --  7.9*   PT/INR No results for input(s): LABPROT, INR in the last 72 hours.   Recent Labs Lab 01/27/17 0741  AST 28  ALT 30  ALKPHOS 95  BILITOT 1.3*  PROT 6.5  ALBUMIN 2.3*     Lipase     Component Value Date/Time   LIPASE 27 01/27/2017 0741     Studies/Results: Ct Abdomen Pelvis W Contrast  Result Date:  01/27/2017 CLINICAL DATA:  Pt spouse reports that pt began having vomiting and abd pain that started suddenly. Pt spouse reports pt had CT scan on Friday and was dx with (multiple, possibly infected) pseudocyst(s) of pancreas and if vomiting or pain worsened to come to ED due to possible rupture. Scheduled for drainage on Monday. EXAM: CT ABDOMEN AND PELVIS WITH CONTRAST TECHNIQUE: Multidetector CT imaging of the abdomen and pelvis was performed using the standard protocol following bolus administration of intravenous contrast. CONTRAST:  15mL ISOVUE-300 IOPAMIDOL (ISOVUE-300) INJECTION 61%, ISOVUE-300 IOPAMIDOL (ISOVUE-300) INJECTION 61% COMPARISON:  01/24/2017 FINDINGS: Lower chest: Dependent atelectasis in the lower lobes, left greater than right. Minimal left pleural effusion. Heart is mildly enlarged. Stable appearance when compared to the recent prior study. Hepatobiliary: Liver normal in size. No mass or focal lesion. Gallbladder surgically absent. No bile duct dilation. Pancreas: Homogeneous enhancement pancreas. No pancreatic masses. There peripancreatic fluid collections that are stable from the prior study, described below. Spleen: Low-density lesion noted in the posterior aspect of the spleen measuring 2.4 cm, stable. Linear areas of low density noted in the anterior spleen, also stable. Spleen normal in size. Adrenals/Urinary Tract: Adrenal glands are unremarkable. Kidneys are normal, without renal calculi, focal lesion, or hydronephrosis. Bladder is unremarkable. Stomach/Bowel: Stomach is moderately distended. There is wall thickening of the distal antrum and pylorus and along  the second portion the duodenum that is likely reactive to the adjacent inflammatory changes. Small bowel is normal in caliber with no wall thickening or inflammatory changes. Colon is unremarkable. Normal appendix visualized. Vascular/Lymphatic: Minor infrarenal abdominal aortic atherosclerosis. No aneurysm. No other  vascular abnormality. The portal vein, splenic vein and superior mesenteric veins are patent. No adenopathy. Reproductive: Unremarkable. Other: There multiple complex fluid collections fat containing bubbles of air. Collections are seen just below the esophageal hiatus, extending along the posterior margin of the stomach and adjacent to the distal stomach and duodenum. Complex fluid collections surround the pancreas and extend below this along the small bowel mesenteries posterior to the transverse colon. The largest collection extends along the left pericolic gutter into the left pelvis. This measures 9.7 x 4.9 cm in greatest transverse dimension. At the inferior margin of the spleen, a portion of the collection along the anterior pararenal fascia measures 11 cm x 3.6 cm. These collections are unchanged when compared to the prior CT. There is no free air. Musculoskeletal: No fracture or acute finding. No osteoblastic or osteolytic lesions. IMPRESSION: 1. Multiple fluid collections as detailed above, which containing bubbles of air, unchanged from the most recent prior CT scan. The collections are suspicious for multiple pancreatitis related abscesses. 2. No new abnormalities since the previous exam.  No free air. Electronically Signed   By: Amie Portland M.D.   On: 01/27/2017 08:59    Medications: . mometasone-formoterol  2 puff Inhalation BID  . pantoprazole (PROTONIX) IV  40 mg Intravenous QHS   . dextrose 5 % and 0.9 % NaCl with KCl 20 mEq/L 100 mL/hr at 01/28/17 0511  . piperacillin-tazobactam (ZOSYN)  IV 3.375 g (01/28/17 0510)  . vancomycin Stopped (01/28/17 0309)   Assessment/Plan Gallstone pancreatitis with postop pancreatic pancreatitis/pseudocyst abscess.  IR placement of 12 French drain into pancreatic abscess 01/28/17  Ongoing epigastric pain nausea and vomiting 52 pound weight loss since surgery admit Severe protein calorie malnutrition Status post laparoscopic cholecystectomy with IOC  12/13/16 Dr. Royston Sinner III  Oral candidiasis post op Voice loss/hoarseness - post op Post op atelectasis/pleural effusions - ?   Hypokalemia - being replaced Dr. Butler Denmark History of anxiety History of asthma Anemia   Hgd - 8.8 - 01/28/2017 FEN:  IV fluids/NPO ID:  Zosyn 01/27/17 =>> day 2  vancomycin 1 dose ordered yesterday. DVT:  SCD/ IR procedure today   Plan: Clear liquids, Magic mouthwash, restart home meds, check prealbumin recheck lipase. Continue antibiotics, await culture report.    LOS: 1 day    JENNINGS,WILLARD 01/28/2017 [phone]  Agree with above.  Wife in room. They have seen my brother, C. Ezzard Standing, for his vocal cords.  He said that he is already feeling better post pancreatic drainage. Needs to ambulate.  Ovidio Kin, MD, [facility] Pager: [phone] Office phone:  [phone]

## 2017-01-28 NOTE — Sedation Documentation (Signed)
Drainage aspirated puss/ greyish color

## 2017-01-29 LAB — COMPREHENSIVE METABOLIC PANEL
ALBUMIN: 1.8 g/dL — AB (ref 3.5–5.0)
ALK PHOS: 60 U/L (ref 38–126)
ALT: 21 U/L (ref 17–63)
ANION GAP: 5 (ref 5–15)
AST: 15 U/L (ref 15–41)
BILIRUBIN TOTAL: 0.8 mg/dL (ref 0.3–1.2)
BUN: 7 mg/dL (ref 6–20)
CALCIUM: 7.8 mg/dL — AB (ref 8.9–10.3)
CO2: 23 mmol/L (ref 22–32)
Chloride: 104 mmol/L (ref 101–111)
Creatinine, Ser: 0.56 mg/dL — ABNORMAL LOW (ref 0.61–1.24)
GFR calc non Af Amer: >60 mL/min (ref >60)
GLUCOSE: 111 mg/dL — AB (ref 65–99)
POTASSIUM: 3.5 mmol/L (ref 3.5–5.1)
SODIUM: 132 mmol/L — AB (ref 135–145)
TOTAL PROTEIN: 4.8 g/dL — AB (ref 6.5–8.1)

## 2017-01-29 LAB — CBC
HCT: 26.3 % — ABNORMAL LOW (ref 39.0–52.0)
HEMOGLOBIN: 8.8 g/dL — AB (ref 13.0–17.0)
MCH: 30.9 pg (ref 26.0–34.0)
MCHC: 33.5 g/dL (ref 30.0–36.0)
MCV: 92.3 fL (ref 78.0–100.0)
Platelets: 283 10*3/uL (ref 150–400)
RBC: 2.85 MIL/uL — ABNORMAL LOW (ref 4.22–5.81)
RDW: 13.7 % (ref 11.5–15.5)
WBC: 9.5 10*3/uL (ref 4.0–10.5)

## 2017-01-29 LAB — LIPASE, BLOOD: Lipase: 23 U/L (ref 11–51)

## 2017-01-29 LAB — PREALBUMIN: PREALBUMIN: 6.2 mg/dL — AB (ref 18–38)

## 2017-01-29 MED ORDER — MAGIC MOUTHWASH W/LIDOCAINE
5.0000 mL | Freq: Three times a day (TID) | ORAL | Status: DC
  Administered 2017-01-29 – 2017-02-02 (×13): 5 mL via ORAL
  Filled 2017-01-29 (×14): qty 5

## 2017-01-29 MED ORDER — VANCOMYCIN HCL 10 G IV SOLR
1250.0000 mg | Freq: Two times a day (BID) | INTRAVENOUS | Status: DC
  Administered 2017-01-29: 1250 mg via INTRAVENOUS
  Filled 2017-01-29 (×2): qty 1250

## 2017-01-29 MED ORDER — PREMIER PROTEIN SHAKE
11.0000 [oz_av] | Freq: Three times a day (TID) | ORAL | Status: DC
  Administered 2017-01-29 – 2017-02-01 (×7): 11 [oz_av] via ORAL
  Filled 2017-01-29 (×8): qty 325.31

## 2017-01-29 MED ORDER — ENOXAPARIN SODIUM 40 MG/0.4ML ~~LOC~~ SOLN
40.0000 mg | SUBCUTANEOUS | Status: DC
  Administered 2017-01-29 – 2017-02-02 (×5): 40 mg via SUBCUTANEOUS
  Filled 2017-01-29 (×5): qty 0.4

## 2017-01-29 NOTE — Progress Notes (Signed)
Chief Complaint:   abdominal pain/history gallstone pancreatitis with pancreatic pseudocyst postop  Subjective: Some nausea yesterday but overall appears to be doing better. He is on full liquids but sounds like he is taking mostly clears.  Pain is much improved. Drainage from the IR drain is purulent fluid.  Objective: Vital signs in last 24 hours: Temp:  [98 F (36.7 C)-98.2 F (36.8 C)] 98.1 F (36.7 C) (04/24 6578) Pulse Rate:  [79-83] 81 (04/24 0613) Resp:  [16-18] 16 (04/24 0613) BP: (98-107)/(59-67) 102/65 (04/24 0613) SpO2:  [94 %-97 %] 95 % (04/24 0802) Last BM Date: 01/26/17 480PO 4200 IV 900 urine recorded Drain 80 No BM recorded Afebrile vital signs are stable Labs are stable, NA 132, prealbumin 6.2 WBC 9.5 Hemoglobin stable at 8.8 HIV - nonreactive Specimen Description ABSCESS PANCREAS   Special Requests NONE   Gram Stain ABUNDANT WBC PRESENT, PREDOMINANTLY PMN  MODERATE GRAM NEGATIVE RODS  MODERATE GRAM POSITIVE COCCI IN PAIRS IN CHAINS        Intake/Output from previous day: 04/23 0701 - 04/24 0700 In: 4685 [P.O.:480; I.V.:3000; IV Piggyback:1200] Out: 980 [Urine:900; Drains:80] Intake/Output this shift: No intake/output data recorded.  General appearance: alert, cooperative and no distress Resp: clear to auscultation bilaterally GI: soft, non-tender; bowel sounds normal; no masses,  no organomegaly and Port sites all look good. Drainage from the IR drain is purulent fluid. He is out ambulating with his son at this point.  Lab Results:   Recent Labs  01/28/17 0434 01/29/17 0450  WBC 15.6* 9.5  HGB 8.8* 8.8*  HCT 26.7* 26.3*  PLT 315 283    BMET  Recent Labs  01/28/17 0434 01/29/17 0450  NA 134* 132*  K 3.5 3.5  CL 103 104  CO2 25 23  GLUCOSE 118* 111*  BUN 9 7  CREATININE 0.62 0.56*  CALCIUM 7.9* 7.8*   PT/INR No results for input(s): LABPROT, INR in the last 72 hours.   Recent Labs Lab 01/27/17 0741 01/29/17 0450   AST 28 15  ALT 30 21  ALKPHOS 95 60  BILITOT 1.3* 0.8  PROT 6.5 4.8*  ALBUMIN 2.3* 1.8*     Lipase     Component Value Date/Time   LIPASE 23 01/29/2017 0450     Studies/Results: Ct Image Guided Drainage By Percutaneous Catheter  Result Date: 01/28/2017 INDICATION: Pancreatic abscess drain EXAM: CT GUIDED DRAINAGE OF PANCREATIC ABSCESS MEDICATIONS: The patient is currently admitted to the hospital and receiving intravenous antibiotics. The antibiotics were administered within an appropriate time frame prior to the initiation of the procedure. ANESTHESIA/SEDATION: Three mg IV Versed 125 mcg IV Fentanyl Moderate Sedation Time:  20 The patient was continuously monitored during the procedure by the interventional radiology nurse under my direct supervision. COMPLICATIONS: None immediate. TECHNIQUE: Informed written consent was obtained from the patient after a thorough discussion of the procedural risks, benefits and alternatives. All questions were addressed. Maximal Sterile Barrier Technique was utilized including caps, mask, sterile gowns, sterile gloves, sterile drape, hand hygiene and skin antiseptic. A timeout was performed prior to the initiation of the procedure. PROCEDURE: The left flank was prepped with ChloraPrep in a sterile fashion, and a sterile drape was applied covering the operative field. A sterile gown and sterile gloves were used for the procedure. Local anesthesia was provided with 1% Lidocaine. Under CT guidance, an 18 gauge needle was advanced into the pancreatic abscess and removed over an Amplatz wire. Twelve [place] dilator followed by a 12 [place] drain  were inserted. It was looped and string fixed then sewn to the skin. Frank pus was aspirated. FINDINGS: Images document 25 French drain placement into a pancreatic abscess. IMPRESSION: Successful 12 French left flank pancreatic abscess drain. Electronically Signed   By: [name] M.D.   On: 01/28/2017 10:39     Medications: . clonazePAM  1.5 mg Oral QHS  . mometasone-formoterol  2 puff Inhalation BID  . montelukast  10 mg Oral QHS  . pantoprazole (PROTONIX) IV  40 mg Intravenous QHS  . sertraline  200 mg Oral QHS   . dextrose 5 % and 0.9 % NaCl with KCl 20 mEq/L 100 mL/hr at 01/29/17 0234  . piperacillin-tazobactam (ZOSYN)  IV 3.375 g (01/29/17 0650)  . vancomycin 1,250 mg (01/29/17 1001)   Assessment/Plan Gallstone pancreatitis with postop pancreatic pancreatitis/pseudocyst abscess.   IR placement of 12 French drain into pancreatic abscess 01/28/17  Ongoing epigastric pain nausea and vomiting 52 pound weight loss since surgery admit Severe protein calorie malnutrition - prealbumin 6.2 Status post laparoscopic cholecystectomy with IOC 12/13/16 Dr. Royston Sinner [name] Oral candidiasis post op Voice loss/hoarseness - post op Post op atelectasis/pleural effusions - ?  Hypokalemia - being replaced Dr. Butler [name] History of anxiety History of asthma Anemia  Hbg- 8.8 - 01/28/2017 FEN:  IV fluids/full liquids ID:  Zosyn 4/22 =>> day 3  Vancomycin 4/22 =>> day 3 - culture:  gram-positive and gram-negative organisms on Gram stain DVT:  Lovenox/SCD  Plan: Continue antibiotics, Magic mouthwash continue IV fluids.  I've asked nutrition OT and PT to see at his family's request.     LOS: 2 days    [name],[name] 01/29/2017 [phone]  Agree with above.  Abdomen benign. Clearly is doing better since drain.  But we talked about length of hospitalization - maybe home later this week, and length of drain - probably very long, 3 to 6 weeks, depending on clinical course.  [name] Kin, MD, [facility] Surgery Pager: [phone] Office phone:  [phone]

## 2017-01-29 NOTE — Progress Notes (Signed)
Initial Nutrition Assessment  DOCUMENTATION CODES:   Severe malnutrition in context of acute illness/injury  INTERVENTION:   Provide Premier Protein TID, each supplement provides 160kcal and 30g protein.  Encourage PO intakes as tolerated Provided "Pancreatitis Nutrition Therapy" handout from [employer]. Reviewed protein foods and supplements with patient  If patient unable meet needs via PO, may need to consider nutrition support.  RD to continue to monitor for needs  NUTRITION DIAGNOSIS:   Malnutrition related to acute illness (pancreatitis, pancreatic pseudocyst) as evidenced by percent weight loss, energy intake < or equal to 50% for > or equal to 5 days, moderate depletion of body fat, severe depletion of muscle mass.  GOAL:   Patient will meet greater than or equal to 90% of their needs  MONITOR:   PO intake, Supplement acceptance, Labs, Weight trends, I & O's  REASON FOR ASSESSMENT:   Consult Assessment of nutrition requirement/status  ASSESSMENT:   61 y.o. male with history of acute gallstone pancreatitis s/p lap chole 12/13/16 with abscess cavity drainage at the edges of the liver.  Patient developed pancreatitis post-op which improved and he was discharged home, however he continued with poor PO intake and weight loss.  Patient then developed acute nausea and vomiting at home for the past 2 days and presented to his GI office.    4/23: s/p  IR placement of 12 French drain into pancreatic abscess   Patient in room with no family at bedside. Pt reports poor appetite since gallbladder surgery on 3/8. Pt tries to consume healthy meals and drinks a protein shake twice daily that is made with protein powder and 1% milk. Pt states he ate a lot of avocados PTA. Provided handout with recommended and not recommended foods for pancreatitis. Reviewed low fat food items. Encouraged pt to consume protein shakes at least 3 times daily in between meals and to  include a protein food with every meal and every snack.   Pt reports drinking 3 cups of juice for breakfast this morning and eating tomato soup and lemonade for lunch. Pt interested in trying protein supplements, will order Premier Protein shakes.  Per patient, UBW is 195 lb. Per chart, pt has lost 37 lb since March 2018 (18% wt loss x 1.5 months, significant for time frame). Pt reports he walks 1/2 mile a day. Nutrition-Focused physical exam completed. Findings are moderate fat depletion, severe muscle depletion, and no edema.   Medications: Magic Mouthwash w/ Lidocaine TID, IV Protonix daily, D5 and .9% NaCl w/ KCl infusion at 100 ml/hr -provides 408 kcal, IV Zofran Labs reviewed: Low Na  Diet Order:  Diet full liquid Room service appropriate? Yes; Fluid consistency: Thin  Skin:  Reviewed, no issues  Last BM:  4/23  Height:   Ht Readings from Last 1 Encounters:  01/27/17  (1.88 m)    Weight:   Wt Readings from Last 1 Encounters:  01/27/17 168 lb (76.2 kg)    Ideal Body Weight:  86.4 kg  BMI:  Body mass index is 21.57 kg/m.  Estimated Nutritional Needs:   Kcal:  2250-2450  Protein:  110-120g  Fluid:  2.3L/day  EDUCATION NEEDS:   Education needs addressed (Provided "Pancreatitis Nutrition Therapy" handout)  Tilda Franco, MS, RD, LDN Pager: [phone] After Hours Pager: [phone]

## 2017-01-29 NOTE — Evaluation (Signed)
Occupational Therapy Evaluation Patient Details Name: OCIE TINO MRN: [id number] DOB: [birth year] Today's Date: 01/29/2017    History of Present Illness Pt was admitted with acute nausea/vomiting.  He had lap chole 12/13/16.  Had abscess cavity drainage and pancreatitis post op.  Now s/p aspiration/drainage of pancreatic pseudocyst    Clinical Impression   This 61 year old man was admitted for the above.  He needs min A for LB adls and ambulating and will benefit from continued OT. Goals are for supervision to min guard level. Will further assess toilet DME and provide him with UE strengthening program    Follow Up Recommendations  Home health OT (depending upon progress)    Equipment Recommendations   (to be further assessed)    Recommendations for Other Services       Precautions / Restrictions Precautions Precautions: Fall Restrictions Weight Bearing Restrictions: No      Mobility Bed Mobility Overal bed mobility: Needs Assistance Bed Mobility: Supine to Sit     Supine to sit: Supervision;HOB elevated     General bed mobility comments: pt's drain is on L side of body.  He usually gets up on this side.  Educated that if he gets in on right, he can go to sidelying to decrease strain  Transfers Overall transfer level: Needs assistance Equipment used: None Transfers: Sit to/from Stand Sit to Stand: Min guard         General transfer comment: for safety. Held to IV pole once up    Balance                                           ADL either performed or assessed with clinical judgement   ADL Overall ADL's : Needs assistance/impaired     Grooming: Min guard;Standing   Upper Body Bathing: Set up;Sitting   Lower Body Bathing: Minimal assistance;Sit to/from stand   Upper Body Dressing : Set up;Sitting   Lower Body Dressing: Minimal assistance;Sit to/from stand   Toilet Transfer: Minimal assistance;Ambulation (IV pole and back to bed)    Toileting- Clothing Manipulation and Hygiene: Min guard;Sit to/from stand   Tub/ Shower Transfer: Walk-in shower;Minimal assistance;Ambulation     General ADL Comments: Pt had walked in hall with IV pole and nursing students prior to my arrival.  He had 2 LOB during my session:  once when walking to bathroom and once when stepping over shower ledge.  He said that his wife can assist with socks/washing bottom of feet.  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      Pertinent Vitals/Pain Pain Assessment: Faces Faces Pain Scale: Hurts a little bit Pain Location: L side Pain Descriptors / Indicators: Sore Pain Intervention(s): Limited activity within patient's tolerance;Monitored during session;Premedicated before session;Repositioned     Hand Dominance     Extremity/Trunk Assessment Upper Extremity Assessment Upper Extremity Assessment: Generalized weakness           Communication Communication Communication:  (very soft spoken)   Cognition Arousal/Alertness: Awake/alert Behavior During Therapy: WFL for tasks assessed/performed Overall Cognitive Status: Within Functional Limits for tasks assessed  General Comments       Exercises     Shoulder Instructions      Home Living Family/patient expects to be discharged to:: Private residence Living Arrangements: Spouse/significant other Available Help at Discharge: Family               Bathroom Shower/Tub: Tub/shower unit;Walk-in shower   Bathroom Toilet: Standard     Home Equipment: Shower seat          Prior Functioning/Environment          Comments: unsure if pt had assistance since last sx.  Does not use AD.          OT Problem List: Decreased strength;Decreased activity tolerance;Impaired balance (sitting and/or standing);Decreased knowledge of use of DME or AE;Pain      OT  Treatment/Interventions: Self-care/ADL training;DME and/or AE instruction;Patient/family education;Balance training;Therapeutic activities    OT Goals(Current goals can be found in the care plan section) Acute Rehab OT Goals Patient Stated Goal: get strength back OT Goal Formulation: With patient Time For Goal Achievement: 02/05/17 Potential to Achieve Goals: Good ADL Goals Pt Will Transfer to Toilet: with supervision;ambulating;regular height toilet;bedside commode (vs) Pt Will Perform Tub/Shower Transfer: Shower transfer;with min guard assist;ambulating;shower seat Additional ADL Goal #1: pt will be independent with level 2 theraband program  OT Frequency: Min 2X/week   Barriers to D/C:            Co-evaluation              End of Session Nurse Communication:  (RN present and observed his LOB with OT)  Activity Tolerance: Patient tolerated treatment well Patient left: in bed;with call bell/phone within reach;with nursing/sitter in room  OT Visit Diagnosis: Muscle weakness (generalized) (M62.81);Unsteadiness on feet (R26.81)                Time: 1610-9604 OT Time Calculation (min): 10 min Charges:  OT General Charges $OT Visit: 1 Procedure OT Evaluation $OT Eval Low Complexity: 1 Procedure G-Codes:     [place], OTR/L [phone] 01/29/2017  Eb[place] Gernert 01/29/2017, 3:10 PM

## 2017-01-29 NOTE — Progress Notes (Signed)
Pharmacy Antibiotic Note  [name] is a 61 y.o. male admitted on 01/27/2017 with pancreatic pseudocysts / abscess secondary to gallstone pancreatitis.  Pharmacy was consulted for empiric Zosyn / vancomycin dosing.  Now beginning D#3 vancomycin plus Zosyn.    Yesterday:  vancomycin dosage was increased to  q8h based upon trough  CT-guided drainage of pancreatic abscess  Today:  leukocytosis has resolved  SCr is stable  Risk Factors For Vancomycin-Associated Nephrotoxicity: Concomitant Zosyn Exposure to IV iodinated contrast 4/19, 4/22   Plan: Reduce vancomycin to 1250 mg q12h given this morning's clinical improvement and to reduce risk of nephrotoxicity Continue present Zosyn dosage Continue daily SCr Follow-up on culture result  - de-escalate from vancomycin when feasible.  IHeight:  (188 cm) Weight: 168 lb (76.2 kg) IBW/kg (Calculated) : 82.2  Temp (24hrs), Avg:98.1 F (36.7 C), Min:98 F (36.7 C), Max:98.4 F (36.9 C)   Recent Labs Lab 01/27/17 0741 01/27/17 0808 01/27/17 0933 01/28/17 0434 01/28/17 1731 01/29/17 0450  WBC 30.1*  --   --  15.6*  --  9.5  CREATININE 0.52* 0.40*  --  0.62  --  0.56*  LATICACIDVEN  --   --  0.70  --   --   --   VANCOTROUGH  --   --   --   --  13*  --     Estimated Creatinine Clearance: 103.2 mL/min (A) (by C-G formula based on SCr of 0.56 mg/dL (L)).    No Known Allergies  Antimicrobials this admission: Vanc 4/22 >> Zosyn 4/22 >>  Risk Factors For Vancomycin-Associated Nephrotoxicity: Concomitant Zosyn Exposure to IV iodinated contrast 4/19, 4/22  Dose adjustments this admission: 4/23 VT at 1731 = 13 mcg/mL, vanco dose increased from 1g q8h to  q8h 4/24 Vanco dose reduced to  q12h (see above note)  Microbiology results: 4/23 Pancreas abscess: moderate gram negative rods, gram positive cocci in pairs and chains   Thank you for allowing pharmacy to be a part of this patient's care.  Elie Goody, PharmD, BCPS Pager: [phone] 01/29/2017  10:46 AM

## 2017-01-29 NOTE — Progress Notes (Signed)
[facility] Progress Note  [name] 61 y.o. 09-19-1956  CC:  Infected pancreatic pseudocyst   Subjective: Patient had an episode of nausea after liquid diet yesterday requiring antiemetics. Complaining of mild suprapubic discomfort as well as some discomfort at the drain site.  ROS : Negative for chest pain and shortness of breath. Negative vomiting, negative for GI bleed   Objective: Vital signs in last 24 hours: Vitals:   01/28/17 2118 01/29/17 0613  BP: 102/67 102/65  Pulse: 80 81  Resp: 16 16  Temp: 98.2 F (36.8 C) 98.1 F (36.7 C)    Physical Exam:  General:  Frail and cachectic appearing.   Head:  Normocephalic, without obvious abnormality, atraumatic  Eyes:  , EOM's intact,   Lungs:   Decreased breath sounds and fine basilar crackles   Heart:  Regular rate and rhythm, S1, S2 normal  Abdomen:   Left flank drain. Soft, nontender, nondistended, bowel sounds present. No peritoneal signs.   Extremities: Extremities normal, atraumatic, no  edema       Lab Results:  Recent Labs  01/28/17 0434 01/29/17 0450  NA 134* 132*  K 3.5 3.5  CL 103 104  CO2 25 23  GLUCOSE 118* 111*  BUN 9 7  CREATININE 0.62 0.56*  CALCIUM 7.9* 7.8*    Recent Labs  01/27/17 0741 01/29/17 0450  AST 28 15  ALT 30 21  ALKPHOS 95 60  BILITOT 1.3* 0.8  PROT 6.5 4.8*  ALBUMIN 2.3* 1.8*    Recent Labs  01/28/17 0434 01/29/17 0450  WBC 15.6* 9.5  HGB 8.8* 8.8*  HCT 26.7* 26.3*  MCV 89.6 92.3  PLT 315 283   No results for input(s): LABPROT, INR in the last 72 hours.    Assessment/Plan: - Infected pancreatic pseudocyst status post CT-guided drain placement yesterday, culture positive for gram-negative rods as well as gram-positive cocci. - Complicated pancreatitis, initial episode in March 2018 - Significant leukocytosis. Resolved. - Nausea and vomiting. Improving. No further vomiting.  Recommendations --------------------------- - Advance diet to full  liquid. Continue antibiotics for now. Patient may need a prolonged course of antibiotics - Surgery following. Management of drain per IR and surgery. - GI will follow  Kathi Der MD, FACP 01/29/2017, 9:19 AM  Pager [phone]  If no answer or after 5 PM call [phone]

## 2017-01-30 ENCOUNTER — Other Ambulatory Visit: Payer: Self-pay | Admitting: Physician Assistant

## 2017-01-30 DIAGNOSIS — K863 Pseudocyst of pancreas: Secondary | ICD-10-CM

## 2017-01-30 LAB — COMPREHENSIVE METABOLIC PANEL
ALBUMIN: 2 g/dL — AB (ref 3.5–5.0)
ALT: 19 U/L (ref 17–63)
ANION GAP: 9 (ref 5–15)
AST: 18 U/L (ref 15–41)
Alkaline Phosphatase: 65 U/L (ref 38–126)
BILIRUBIN TOTAL: 0.9 mg/dL (ref 0.3–1.2)
BUN: 14 mg/dL (ref 6–20)
CO2: 25 mmol/L (ref 22–32)
Calcium: 8.5 mg/dL — ABNORMAL LOW (ref 8.9–10.3)
Chloride: 104 mmol/L (ref 101–111)
Creatinine, Ser: 1.6 mg/dL — ABNORMAL HIGH (ref 0.61–1.24)
GFR calc Af Amer: 52 mL/min — ABNORMAL LOW (ref >60)
GFR calc non Af Amer: 45 mL/min — ABNORMAL LOW (ref >60)
GLUCOSE: 110 mg/dL — AB (ref 65–99)
POTASSIUM: 3.8 mmol/L (ref 3.5–5.1)
SODIUM: 138 mmol/L (ref 135–145)
TOTAL PROTEIN: 5.6 g/dL — AB (ref 6.5–8.1)

## 2017-01-30 LAB — CREATININE, SERUM
Creatinine, Ser: 1.38 mg/dL — ABNORMAL HIGH (ref 0.61–1.24)
GFR calc Af Amer: >60 mL/min (ref >60)
GFR, EST NON AFRICAN AMERICAN: 53 mL/min — AB (ref >60)

## 2017-01-30 NOTE — Progress Notes (Signed)
Occupational Therapy Treatment Patient Details Name: [name] MRN: [id number] DOB: Sep 05, 1956 Today's Date: 01/30/2017    History of present illness Pt was admitted with acute nausea/vomiting.  He had lap chole 12/13/16.  Had abscess cavity drainage and pancreatitis post op.  Now s/p aspiration/drainage of pancreatic pseudocyst    OT comments  Issued theraband UE exercise program and pt worked through with supervision and cues.  Kept band below 90 degrees so that he would not pull on drain  Follow Up Recommendations  Home health OT    Equipment Recommendations  None recommended by OT    Recommendations for Other Services      Precautions / Restrictions Precautions Precautions: Fall Precaution Comments: L drain Restrictions Weight Bearing Restrictions: No       Mobility Bed Mobility Overal bed mobility: Needs Assistance Bed Mobility: Supine to Sit     Supine to sit: Supervision;HOB elevated     General bed mobility comments: supervision to sit to supine  Transfers Overall transfer level: Needs assistance Equipment used: None Transfers: Sit to/from Stand Sit to Stand: Min guard         General transfer comment: min/guard for safety    Balance Overall balance assessment: History of Falls (reports one controlled fall prior to admission, pt tripped on his feet and slid down to the floor with back against wall)                                         ADL either performed or assessed with clinical judgement   ADL                                               Vision       Perception     Praxis      Cognition Arousal/Alertness: Awake/alert Behavior During Therapy: WFL for tasks assessed/performed Overall Cognitive Status: Within Functional Limits for tasks assessed                                          Exercises Other Exercises Other Exercises: gave pt level one theraband for FF exercises to  90 degrees and level 2 theraband for horizontal abduction and elbow flexion. Written HEP provided.  Supervision and cues given.  Pt performed 5   Shoulder Instructions       General Comments      Pertinent Vitals/ Pain       Pain Assessment: No/denies pain  Home Living Family/patient expects to be discharged to:: Private residence Living Arrangements: Spouse/significant other Available Help at Discharge: Family Type of Home: House Home Access: Level entry     Home Layout: One level               Home Equipment: Shower seat          Prior Functioning/Environment Level of Independence: Independent            Frequency  Min 2X/week        Progress Toward Goals  OT Goals(current goals can now be found in the care plan section)  Progress towards OT goals: Progressing toward goals  Acute Rehab OT Goals Patient  Stated Goal: get strength back  Plan      Co-evaluation                 End of Session    OT Visit Diagnosis: Muscle weakness (generalized) (M62.81);Unsteadiness on feet (R26.81)   Activity Tolerance Patient limited by fatigue   Patient Left in bed;with call bell/phone within reach;with bed alarm set   Nurse Communication          Time: 4696-2952 OT Time Calculation (min): 13 min  Charges: OT General Charges $OT Visit: 1 Procedure OT Treatments $Therapeutic Exercise: 8-22 mins  Marica Otter, OTR/L [phone] 01/30/2017   Julias Mould 01/30/2017, 12:47 PM

## 2017-01-30 NOTE — Progress Notes (Signed)
AO:[id number] pain/history gallstone pancreatitis with pancreatic pseudocyst postop  Subjective: No pain and he is ordering diet.  Drainage from IR drain still looks purulent.    Objective: Vital signs in last 24 hours: Temp:  [98.3 F (36.8 C)-98.8 F (37.1 C)] 98.8 F (37.1 C) (04/25 0516) Pulse Rate:  [77-84] 77 (04/25 0516) Resp:  [16] 16 (04/25 0516) BP: (98-109)/(61-70) 98/61 (04/25 0516) SpO2:  [96 %-99 %] 99 % (04/25 0516) Last BM Date: 01/28/17 Specimen Description ABSCESS PANCREAS   Special Requests NONE   Gram Stain ABUNDANT WBC PRESENT, PREDOMINANTLY PMN  MODERATE GRAM NEGATIVE RODS  MODERATE GRAM POSITIVE COCCI IN PAIRS IN CHAINS     200 PO recorded 2400 IV fluids recorded Urine x 6 Drain 75 BM x 1 Afebrile vital signs are stable Creatinine up to 1.38 this a.m.?      Intake/Output from previous day: 04/24 0701 - 04/25 0700 In: 3260 [P.O.:200; I.V.:2400; IV Piggyback:650] Out: 75 [Drains:75] Intake/Output this shift: No intake/output data recorded.  General appearance: alert, cooperative and no distress Resp: clear to auscultation bilaterally GI: soft, non-tender; bowel sounds normal; no masses,  no organomegaly and Not much drainage in the bag this a.m. but what is there is still the purulent appearing fluid noted before.  Lab Results:   Recent Labs  01/28/17 0434 01/29/17 0450  WBC 15.6* 9.5  HGB 8.8* 8.8*  HCT 26.7* 26.3*  PLT 315 283    BMET  Recent Labs  01/28/17 0434 01/29/17 0450 01/30/17 0441  NA 134* 132*  --   K 3.5 3.5  --   CL 103 104  --   CO2 25 23  --   GLUCOSE 118* 111*  --   BUN 9 7  --   CREATININE 0.62 0.56* 1.38*  CALCIUM 7.9* 7.8*  --    PT/INR No results for input(s): LABPROT, INR in the last 72 hours.   Recent Labs Lab 01/27/17 0741 01/29/17 0450  AST 28 15  ALT 30 21  ALKPHOS 95 60  BILITOT 1.3* 0.8  PROT 6.5 4.8*  ALBUMIN 2.3* 1.8*     Lipase     Component Value Date/Time   LIPASE 23  01/29/2017 0450     Studies/Results: Ct Image Guided Drainage By Percutaneous Catheter  Result Date: 01/28/2017 INDICATION: Pancreatic abscess drain EXAM: CT GUIDED DRAINAGE OF PANCREATIC ABSCESS MEDICATIONS: The patient is currently admitted to the hospital and receiving intravenous antibiotics. The antibiotics were administered within an appropriate time frame prior to the initiation of the procedure. ANESTHESIA/SEDATION: Three mg IV Versed 125 mcg IV Fentanyl Moderate Sedation Time:  20 The patient was continuously monitored during the procedure by the interventional radiology nurse under my direct supervision. COMPLICATIONS: None immediate. TECHNIQUE: Informed written consent was obtained from the patient after a thorough discussion of the procedural risks, benefits and alternatives. All questions were addressed. Maximal Sterile Barrier Technique was utilized including caps, mask, sterile gowns, sterile gloves, sterile drape, hand hygiene and skin antiseptic. A timeout was performed prior to the initiation of the procedure. PROCEDURE: The left flank was prepped with ChloraPrep in a sterile fashion, and a sterile drape was applied covering the operative field. A sterile gown and sterile gloves were used for the procedure. Local anesthesia was provided with 1% Lidocaine. Under CT guidance, an 18 gauge needle was advanced into the pancreatic abscess and removed over an Amplatz wire. Twelve [place] dilator followed by a 12 [place] drain were inserted. It was looped and string  fixed then sewn to the skin. Frank pus was aspirated. FINDINGS: Images document 21 French drain placement into a pancreatic abscess. IMPRESSION: Successful 12 French left flank pancreatic abscess drain. Electronically Signed   By: Jolaine Click M.D.   On: 01/28/2017 10:39    Medications: . clonazePAM  1.5 mg Oral QHS  . enoxaparin (LOVENOX) injection  40 mg Subcutaneous Q24H  . magic mouthwash w/lidocaine  5 mL Oral TID  .  mometasone-formoterol  2 puff Inhalation BID  . montelukast  10 mg Oral QHS  . pantoprazole (PROTONIX) IV  40 mg Intravenous QHS  . protein supplement shake  11 oz Oral TID BM  . sertraline  200 mg Oral QHS   . dextrose 5 % and 0.9 % NaCl with KCl 20 mEq/L 100 mL/hr at 01/29/17 2247  . piperacillin-tazobactam (ZOSYN)  IV 3.375 g (01/30/17 0700)   Assessment/Plan Gallstone pancreatitis with postop pancreatic pancreatitis/pseudocyst abscess.             IR placement of 12 French drain into pancreatic abscess 01/28/17 Ongoing epigastric pain nausea and vomiting 52 pound weight loss since surgery admit Severe protein calorie malnutrition - prealbumin 6.2 Status post laparoscopic cholecystectomy with IOC 12/13/16 Dr. Royston Sinner III Oral candidiasis post op Voice loss/hoarseness - post op Creatinine elevation after 3 days of vancomycin  - stopping vancomycin today Post op atelectasis/pleural effusions - ?  History of anxiety History of asthma Anemia Hbg- 8.8 - 01/28/2017 FEN:  IV fluids/full liquids =>> low fat diet ID:  Zosyn 4/22 =>> day 4  Vancomycin 4/22 =>> day 4 - culture:  gram-positive and gram-negative organisms on Gram stain DVT:  Lovenox/SCD   Plan: With creatinine elevation on going to stop his vancomycin today. He put him on a low fat diet. Continue to mobilize. Await culture completion. He does well we aim to discharge home on oral antibiotics the next 24-48 hours. We will ask staff teach patient how to irrigate the drain at home. Ask case manager to see and help with arrangements for this. Ask IR to set up for outpatient drain clinic evaluation. Then follow-up with GI and Dr. Carolynne Edouard.   LOS: 3 days    JENNINGS,WILLARD 01/30/2017 [phone]  Agree with above. Getting better.  Ovidio Kin, MD, [facility] Pager: [phone] Office phone:  [phone]

## 2017-01-30 NOTE — Evaluation (Signed)
Physical Therapy Evaluation Patient Details Name: [name] MRN: [id number] DOB: [birth year] Today's Date: 01/30/2017   History of Present Illness  Pt was admitted with acute nausea/vomiting.  He had lap chole 12/13/16.  Had abscess cavity drainage and pancreatitis post op.  Now s/p aspiration/drainage of pancreatic pseudocyst   Clinical Impression  Pt admitted with above diagnosis. Pt currently with functional limitations due to the deficits listed below (see PT Problem List).  Pt will benefit from skilled PT to increase their independence and safety with mobility to allow discharge to the venue listed below.  Pt initially unsteady with gait however was attempting to not hit his feet on IV pole base so therapist pushed IV pole, and pt's steadiness improved.  Pt does reports a fall prior to admission and would benefit from RW at home for safety in case feeling weaker and/or spouse needs to assist as well as home safety evaluation.  Pt tolerated ambulating in hallway well.     Follow Up Recommendations Home health PT (home safety eval)    Equipment Recommendations  Rolling walker with 5" wheels    Recommendations for Other Services       Precautions / Restrictions Precautions Precautions: Fall Precaution Comments: L drain Restrictions Weight Bearing Restrictions: No      Mobility  Bed Mobility Overal bed mobility: Needs Assistance Bed Mobility: Supine to Sit     Supine to sit: Supervision;HOB elevated        Transfers Overall transfer level: Needs assistance Equipment used: None Transfers: Sit to/from Stand Sit to Stand: Min guard         General transfer comment: min/guard for safety  Ambulation/Gait Ambulation/Gait assistance: Min guard Ambulation Distance (Feet): 400 Feet Assistive device: None Gait Pattern/deviations: Step-through pattern     General Gait Details: initially started with pt pushing IV pole, observed pt attempting to not hit wheels of IV pole  causing him to be more unsteady, therapist pushed IV pole and pt much more steady, no LOB  Stairs            Wheelchair Mobility    Modified Rankin (Stroke Patients Only)       Balance Overall balance assessment: History of Falls (reports one controlled fall prior to admission, pt tripped on his feet and slid down to the floor with back against wall)                                           Pertinent Vitals/Pain Pain Assessment: No/denies pain    Home Living Family/patient expects to be discharged to:: Private residence Living Arrangements: Spouse/significant other Available Help at Discharge: Family Type of Home: House Home Access: Level entry     Home Layout: One level Home Equipment: Shower seat      Prior Function Level of Independence: Independent               Hand Dominance        Extremity/Trunk Assessment        Lower Extremity Assessment Lower Extremity Assessment: Overall WFL for tasks assessed       Communication   Communication: Other (comment) (soft spoken)  Cognition Arousal/Alertness: Awake/alert Behavior During Therapy: WFL for tasks assessed/performed Overall Cognitive Status: Within Functional Limits for tasks assessed  General Comments      Exercises     Assessment/Plan    PT Assessment Patient needs continued PT services  PT Problem List Decreased mobility;Decreased balance;Decreased knowledge of use of DME       PT Treatment Interventions Therapeutic activities;DME instruction;Gait training;Therapeutic exercise;Stair training;Balance training;Functional mobility training;Patient/family education    PT Goals (Current goals can be found in the Care Plan section)  Acute Rehab PT Goals PT Goal Formulation: With patient Time For Goal Achievement: 02/06/17 Potential to Achieve Goals: Good    Frequency Min 3X/week   Barriers to discharge         Co-evaluation               End of Session   Activity Tolerance: Patient tolerated treatment well Patient left: in bed;with call bell/phone within reach;Other (comment) (sitting EOB with OT)   PT Visit Diagnosis: Unsteadiness on feet (R26.81)    Time: 1610-9604 PT Time Calculation (min) (ACUTE ONLY): 12 min   Charges:   PT Evaluation $PT Eval Low Complexity: 1 Procedure     PT G Codes:       Zenovia Jarred, PT, DPT 01/30/2017 Pager: [phone]   Maida Sale E 01/30/2017, 11:05 AM

## 2017-01-30 NOTE — Progress Notes (Signed)
Both Pt and wife educated on drain flushing and dressing change. All questions and concerns addressed

## 2017-01-31 ENCOUNTER — Inpatient Hospital Stay (HOSPITAL_COMMUNITY): Payer: BC Managed Care – PPO

## 2017-01-31 LAB — AEROBIC/ANAEROBIC CULTURE (SURGICAL/DEEP WOUND)

## 2017-01-31 LAB — AEROBIC/ANAEROBIC CULTURE W GRAM STAIN (SURGICAL/DEEP WOUND)

## 2017-01-31 LAB — BASIC METABOLIC PANEL
ANION GAP: 8 (ref 5–15)
BUN: 14 mg/dL (ref 6–20)
CO2: 24 mmol/L (ref 22–32)
Calcium: 8.4 mg/dL — ABNORMAL LOW (ref 8.9–10.3)
Chloride: 105 mmol/L (ref 101–111)
Creatinine, Ser: 1.67 mg/dL — ABNORMAL HIGH (ref 0.61–1.24)
GFR calc Af Amer: 49 mL/min — ABNORMAL LOW (ref >60)
GFR, EST NON AFRICAN AMERICAN: 42 mL/min — AB (ref >60)
GLUCOSE: 139 mg/dL — AB (ref 65–99)
POTASSIUM: 4.4 mmol/L (ref 3.5–5.1)
Sodium: 137 mmol/L (ref 135–145)

## 2017-01-31 LAB — CBC
HCT: 25 % — ABNORMAL LOW (ref 39.0–52.0)
HEMOGLOBIN: 8.1 g/dL — AB (ref 13.0–17.0)
MCH: 29.2 pg (ref 26.0–34.0)
MCHC: 32.4 g/dL (ref 30.0–36.0)
MCV: 90.3 fL (ref 78.0–100.0)
PLATELETS: 266 10*3/uL (ref 150–400)
RBC: 2.77 MIL/uL — ABNORMAL LOW (ref 4.22–5.81)
RDW: 13.8 % (ref 11.5–15.5)
WBC: 12.2 10*3/uL — ABNORMAL HIGH (ref 4.0–10.5)

## 2017-01-31 LAB — URINALYSIS, ROUTINE W REFLEX MICROSCOPIC
Bilirubin Urine: NEGATIVE
GLUCOSE, UA: NEGATIVE mg/dL
Hgb urine dipstick: NEGATIVE
Ketones, ur: NEGATIVE mg/dL
LEUKOCYTES UA: NEGATIVE
Nitrite: NEGATIVE
PROTEIN: NEGATIVE mg/dL
Specific Gravity, Urine: 1.008 (ref 1.005–1.030)
pH: 5 (ref 5.0–8.0)

## 2017-01-31 LAB — IRON AND TIBC
IRON: 6 ug/dL — AB (ref 45–182)
Saturation Ratios: 4 % — ABNORMAL LOW (ref 17.9–39.5)
TIBC: 144 ug/dL — AB (ref 250–450)
UIBC: 138 ug/dL

## 2017-01-31 LAB — SODIUM, URINE, RANDOM: SODIUM UR: 85 mmol/L

## 2017-01-31 LAB — FERRITIN: Ferritin: 810 ng/mL — ABNORMAL HIGH (ref 24–336)

## 2017-01-31 LAB — CREATININE, URINE, RANDOM: Creatinine, Urine: 39.61 mg/dL

## 2017-01-31 MED ORDER — PANTOPRAZOLE SODIUM 40 MG PO TBEC
40.0000 mg | DELAYED_RELEASE_TABLET | Freq: Every day | ORAL | Status: DC
  Administered 2017-01-31 – 2017-02-01 (×2): 40 mg via ORAL
  Filled 2017-01-31 (×2): qty 1

## 2017-01-31 NOTE — Consult Note (Signed)
Reason for Consult: Acute Kidney Injury  Referring Physician:Gaius Ezzard Standing M.D./Will Marlyne Beards P.A.(CCS)  HPI:  61 year old Caucasian man with past medical history significant for bronchial asthma and anxiety who was recently admitted to the hospital last months for gallstone pancreatitis and underwent laparoscopic cholecystectomy with prolonged postoperative stay. Since his discharge, he has had failure to thrive with a corresponding 50 pound weight loss and on 01/24/17 underwent CT scan of the abdomen with contrast that showed multiple fluid collections around the pancreas with leukocytosis but he declined hospitalization only to return for further evaluation when he developed large volume dark emesis. He then again on 01/27/17 had CT scan of the abdomen with contrast that showed multiple fluid collections in containing bubbles of air raising suspicion for pancreatic abscess following pancreatitis/pseudocyst development. On 01/28/17 he then underwent CT-guided drain placement via the left flank of the pancreatic abscess.  He was started on broad-spectrum antimicrobial coverage with vancomycin and Zosyn on 4/22 with a vancomycin trough of 13 on 4/23. His vancomycin was discontinued yesterday after noticing his rising creatinine. He has remained nonoliguric with decent urine output. He has not had any critical hypotension except around 4/23. He has not been on NSAIDs. He denies any prior history of acute kidney injury, hematuria, proteinuria, nephrolithiasis or recurrent UTIs.    12/21/2016  12/23/2016  01/29/2017  01/30/2017  01/30/2017  01/31/2017   BUN Creatinine 0.69 0.64 0.56 (L) 1.38 (H) 1.60 (H) 1.67 (H)     Past Medical History:  Diagnosis Date  . Anxiety   . Asthma    uses inhaler  . Pancreatitis     Past Surgical History:  Procedure Laterality Date  . CHOLECYSTECTOMY N/A 12/13/2016   Procedure: LAPAROSCOPIC CHOLECYSTECTOMY WITH INTRAOPERATIVE CHOLANGIOGRAM;  Surgeon: Chevis Pretty  III, MD;  Location: [facility];  Service: General;  Laterality: N/A;  . GUM SURGERY      Family History  Problem Relation Age of Onset  . Dementia Mother     Social History:  reports that he has never smoked. He has never used smokeless tobacco. He reports that he drinks about 0.6 oz of alcohol per week . He reports that he does not use drugs.  Allergies: No Known Allergies  Medications:  Scheduled: . clonazePAM  1.5 mg Oral QHS  . enoxaparin (LOVENOX) injection  40 mg Subcutaneous Q24H  . magic mouthwash w/lidocaine  5 mL Oral TID  . mometasone-formoterol  2 puff Inhalation BID  . montelukast  10 mg Oral QHS  . pantoprazole  40 mg Oral QHS  . protein supplement shake  11 oz Oral TID BM  . sertraline  200 mg Oral QHS    BMP Latest Ref Rng & Units 01/31/2017 01/30/2017 01/30/2017  Glucose 65 - 99 mg/dL 782(N) 562(Z) -  BUN 6 - 20 mg/dL 14 14 -  Creatinine 3.08 - 1.24 mg/dL 6.57(Q) 4.69(G) 2.95(M)  Sodium 135 - 145 mmol/L 137 138 -  Potassium 3.5 - 5.1 mmol/L 4.4 3.8 -  Chloride 101 - 111 mmol/L 105 104 -  CO2 22 - 32 mmol/L 24 25 -  Calcium 8.9 - 10.3 mg/dL 8.4(X) 3.2(G) -   CBC Latest Ref Rng & Units 01/31/2017 01/29/2017 01/28/2017  WBC 4.0 - 10.5 K/uL 12.2(H) 9.5 15.6(H)  Hemoglobin 13.0 - 17.0 g/dL 8.1(L) 8.8(L) 8.8(L)  Hematocrit 39.0 - 52.0 % 25.0(L) 26.3(L) 26.7(L)  Platelets 150 - 400 K/uL 266 283 315     No results found.  Review of Systems  Constitutional: Positive for malaise/fatigue. Negative for chills and fever.  HENT: Negative for sore throat.        Dysphonia after recent intubation for surgery  Respiratory: Negative.   Cardiovascular: Negative.   Gastrointestinal: Positive for abdominal pain, nausea and vomiting.  Genitourinary: Negative.   Musculoskeletal: Negative.   Skin: Negative.   Neurological: Negative for dizziness and headaches.  Psychiatric/Behavioral: Negative for substance abuse.   Blood pressure 115/70, pulse 89, temperature 98 F (36.7  C), temperature source Oral, resp. rate 16, height  (1.88 m), weight 76.2 kg (168 lb), SpO2 100 %. Physical Exam  Nursing note and vitals reviewed. Constitutional: He is oriented to person, place, and time. He appears well-developed and well-nourished. No distress.  HENT:  Head: Normocephalic and atraumatic.  Dry skin and oral mucosa  Eyes: EOM are normal. Pupils are equal, round, and reactive to light. No scleral icterus.  Neck: Normal range of motion. Neck supple. No JVD present.  Cardiovascular: Normal rate, regular rhythm and normal heart sounds.   No murmur heard. Respiratory: Breath sounds normal. He has no wheezes. He has no rales.  Poor insipiratory effort  GI: Soft. Bowel sounds are normal. There is tenderness.  Mild epigastric tenderness, left UQ drain  Musculoskeletal: Normal range of motion. He exhibits no edema.  Neurological: He is alert and oriented to person, place, and time.  Skin: Skin is warm and dry. No rash noted. No erythema.    Assessment/Plan: 1. Acute kidney injury: This appears to be most consistent with ATN that is likely multifactorial in the face of his recent acute illness, exposure to the combination of vancomycin/Zosyn and recent exposure to iodinated intravenous contrast with relative hypotension. He has decent urine output at this time. His electrolytes are within acceptable limits and there are no indications for dialysis. I would recommend continued intravenous fluids at this time for intravascular volume expansion given physical exam findings. I have sent off for urine electrolytes and repeat urinalysis along with renal ultrasound. This does not appear to be consistent with an acute glomerulonephritis. Will continue close monitoring and agree with discontinuation of vancomycin. 2. Pancreatic abscess: Status post percutaneous drainage and now on Zosyn. Afebrile and without markers of sepsis. 3. Hyponatremia: Noted initially during hospitalization,  corrected with isotonic intravenous fluids-likely hypovolemic hyponatremia. 4. Anemia: Likely anemia of acute illness/ESA resistance. Will check iron studies.  Amedee Cerrone K. 01/31/2017, 11:51 AM

## 2017-01-31 NOTE — Progress Notes (Signed)
CC: Abdominal pain/nausea and vomiting Subjective: He continues to feel better. Drainage from the IR drain is still poor lead appearing in the bag. He has not had much drainage recorded since [place] day.  Objective: Vital signs in last 24 hours: Temp:  [98.5 F (36.9 C)-99.3 F (37.4 C)] 98.5 F (36.9 C) (04/26 0434) Pulse Rate:  [81-87] 81 (04/26 0434) Resp:  [16] 16 (04/26 0434) BP: (116-122)/(67-70) 122/70 (04/26 0434) SpO2:  [97 %-98 %] 97 % (04/26 0434) Last BM Date: 01/31/17 840PO 2100 IV 2475 urine Drain 10 BM 1 Afebrile vital signs are stable Creatinine was repeated yesterday and said of this a.m. and it was 1.6 we are rechecking the creatinine again this a.m. WBC is up 12.2 Hemoglobin A 0.1/hematocrit 25 Culture from the drain site showed multiple organisms. Intake/Output from previous day: 04/25 0701 - 04/26 0700 In: 2968.3 [P.O.:840; I.V.:1963.3; IV Piggyback:150] Out: 2485 [Urine:2475; Drains:10] Intake/Output this shift: No intake/output data recorded.  General appearance: alert, cooperative, no distress and Still complains of being cold. Resp: clear to auscultation bilaterally GI: soft, non-tender; bowel sounds normal; no masses,  no organomegaly and Drain site okay. He has continued. Drainage in the bag.  Lab Results:   Recent Labs  01/29/17 0450 01/31/17 0528  WBC 9.5 12.2*  HGB 8.8* 8.1*  HCT 26.3* 25.0*  PLT 283 266    BMET  Recent Labs  01/29/17 0450 01/30/17 0441 01/30/17 0917  NA 132*  --  138  K 3.5  --  3.8  CL 104  --  104  CO2 23  --  25  GLUCOSE 111*  --  110*  BUN 7  --  14  CREATININE 0.56* 1.38* 1.60*  CALCIUM 7.8*  --  8.5*   PT/INR No results for input(s): LABPROT, INR in the last 72 hours.   Recent Labs Lab 01/27/17 0741 01/29/17 0450 01/30/17 0917  AST ALT ALKPHOS 95 60 65  BILITOT 1.3* 0.8 0.9  PROT 6.5 4.8* 5.6*  ALBUMIN 2.3* 1.8* 2.0*     Lipase     Component Value Date/Time    LIPASE 23 01/29/2017 0450     . dextrose 5 % and 0.9 % NaCl with KCl 20 mEq/L 100 mL/hr at 01/31/17 0834  . piperacillin-tazobactam (ZOSYN)  IV 3.375 g (01/31/17 0603)   Studies/Results: No results found.  Medications: . clonazePAM  1.5 mg Oral QHS  . enoxaparin (LOVENOX) injection  40 mg Subcutaneous Q24H  . magic mouthwash w/lidocaine  5 mL Oral TID  . mometasone-formoterol  2 puff Inhalation BID  . montelukast  10 mg Oral QHS  . pantoprazole (PROTONIX) IV  40 mg Intravenous QHS  . protein supplement shake  11 oz Oral TID BM  . sertraline  200 mg Oral QHS    Assessment/Plan Gallstone pancreatitis with postop pancreatic pancreatitis/pseudocyst abscess. IR placement of 12 French drain into pancreatic abscess 01/28/17 Ongoing epigastric pain nausea and vomiting 52 pound weight loss since surgery admit Severe protein calorie malnutrition - prealbumin 6.2 Status post laparoscopic cholecystectomy with IOC 12/13/16 Dr. Royston Sinner [name] Oral candidiasis post op Voice loss/hoarseness - post op Creatinine elevation after 3 days of vancomycin  - stopping vancomycin today Post op atelectasis/pleural effusions - ?  History of anxiety History of asthma Anemia Hbg- 8.8 - 01/28/2017  8.1/25  -  01/31/17 FEN: IV fluids/ low fat diet ID: Zosyn 4/22 =>>day 4 Vancomycin 4/22 =>>day 5 - culture:  gram-positive and gram-negative organisms on Gram stain.  Nothing predominates  DVT: Lovenox/SCD   Plan: Creatinine continues to rise. It's up to 1.60 yesterday morning. His white count is also up a small amount. Hemoglobin and hematocrit are down some.  Rechecking creatinine. If it is elevated consider nephrology consult.  Continue IV fluids for now.   Creatinine is up to 1.67, I have called Renal.   Dr. Allena [name] will see.    LOS: 4 days    [name],[name] 01/31/2017 [phone]  Agree with above. Renal to see.  [name] Kin, MD, [facility] Pager:  [phone] Office phone:  [phone]

## 2017-01-31 NOTE — Progress Notes (Signed)
Key Points: Use following P&T approved IV to PO non-antibiotic change policy.  Description contains the criteria that are approved Note: Policy Excludes:  Esophagectomy patientsPHARMACIST - PHYSICIAN COMMUNICATION DR:   CCS CONCERNING: IV to Oral Route Change Policy  RECOMMENDATION: This patient is receiving protonix by the intravenous route.  Based on criteria approved by the Pharmacy and Therapeutics Committee, the intravenous medication(s) is/are being converted to the equivalent oral dose form(s).   DESCRIPTION: These criteria include:  The patient is eating (either orally or via tube) and/or has been taking other orally administered medications for a least 24 hours  The patient has no evidence of active gastrointestinal bleeding or impaired GI absorption (gastrectomy, short bowel, patient on TNA or NPO).  If you have questions about this conversion, please contact the Pharmacy Department    [phone] )  [facility]   [phone] )  [facility]    [phone] )  [facility]   [phone] )  [facility]  Earl Many [place], [facility] 01/31/2017 10:34 AM

## 2017-01-31 NOTE — Progress Notes (Signed)
Spoke with patient and spouse at bedside. Discussed options for [facility] services, nursing for drain care and PT for safety. Patient very deconditioned from previous surgery and long course of illness. No preference for [facility] agency, will contact preferred providers first. Contacted [facility], [facility], [facility], [facility], all declined d/t insurance issues or staffing. Contacted [facility] for referral and they were able to accept. Awaiting final orders (requested), anticipating d/c in am.

## 2017-02-01 LAB — RENAL FUNCTION PANEL
ALBUMIN: 2.2 g/dL — AB (ref 3.5–5.0)
Anion gap: 9 (ref 5–15)
BUN: 13 mg/dL (ref 6–20)
CALCIUM: 8.4 mg/dL — AB (ref 8.9–10.3)
CO2: 22 mmol/L (ref 22–32)
CREATININE: 1.48 mg/dL — AB (ref 0.61–1.24)
Chloride: 106 mmol/L (ref 101–111)
GFR, EST AFRICAN AMERICAN: 57 mL/min — AB (ref >60)
GFR, EST NON AFRICAN AMERICAN: 49 mL/min — AB (ref >60)
Glucose, Bld: 116 mg/dL — ABNORMAL HIGH (ref 65–99)
Phosphorus: 4.2 mg/dL (ref 2.5–4.6)
Potassium: 3.3 mmol/L — ABNORMAL LOW (ref 3.5–5.1)
SODIUM: 137 mmol/L (ref 135–145)

## 2017-02-01 LAB — CBC
HCT: 29.3 % — ABNORMAL LOW (ref 39.0–52.0)
Hemoglobin: 9.6 g/dL — ABNORMAL LOW (ref 13.0–17.0)
MCH: 30.3 pg (ref 26.0–34.0)
MCHC: 32.8 g/dL (ref 30.0–36.0)
MCV: 92.4 fL (ref 78.0–100.0)
Platelets: 291 10*3/uL (ref 150–400)
RBC: 3.17 MIL/uL — AB (ref 4.22–5.81)
RDW: 14.2 % (ref 11.5–15.5)
WBC: 11.9 10*3/uL — AB (ref 4.0–10.5)

## 2017-02-01 MED ORDER — SACCHAROMYCES BOULARDII 250 MG PO CAPS
250.0000 mg | ORAL_CAPSULE | Freq: Two times a day (BID) | ORAL | Status: DC
  Administered 2017-02-01 – 2017-02-02 (×3): 250 mg via ORAL
  Filled 2017-02-01 (×3): qty 1

## 2017-02-01 MED ORDER — POTASSIUM CHLORIDE CRYS ER 20 MEQ PO TBCR
20.0000 meq | EXTENDED_RELEASE_TABLET | Freq: Two times a day (BID) | ORAL | Status: AC
  Administered 2017-02-01 (×2): 20 meq via ORAL
  Filled 2017-02-01 (×2): qty 1

## 2017-02-01 NOTE — Progress Notes (Signed)
Physical Therapy Treatment Patient Details Name: [name] MRN: [id number] DOB: 08-31-1956 Today's Date: 02/01/2017    History of Present Illness Pt was admitted with acute nausea/vomiting.  He had lap chole 12/13/16.  Had abscess cavity drainage and pancreatitis post op.  Now s/p aspiration/drainage of pancreatic pseudocyst     PT Comments    Assisted OOB to bathroom then amb in hallway with NO AD and NOT holding to IV pole.  Pt progressing well and hopes to D/C to home today.   Follow Up Recommendations  Home health PT     Equipment Recommendations       Recommendations for Other Services       Precautions / Restrictions Precautions Precautions: Fall Precaution Comments: L drain, soft voice Restrictions Weight Bearing Restrictions: No    Mobility  Bed Mobility Overal bed mobility: Modified Independent             General bed mobility comments: use of rail and increased time  Transfers Overall transfer level: Needs assistance Equipment used: None Transfers: Sit to/from Stand Sit to Stand: Modified independent (Device/Increase time)         General transfer comment: good safety cognition and use of hands  Ambulation/Gait Ambulation/Gait assistance: Supervision;Min guard Ambulation Distance (Feet): 285 Feet Assistive device: None Gait Pattern/deviations: Step-through pattern Gait velocity: WFL   General Gait Details: good alternating gait with no AD and NOT holding to IV pole.     Stairs            Wheelchair Mobility    Modified Rankin (Stroke Patients Only)       Balance                                            Cognition Arousal/Alertness: Awake/alert Behavior During Therapy: WFL for tasks assessed/performed Overall Cognitive Status: Within Functional Limits for tasks assessed                                        Exercises Other Exercises Other Exercises: pt verbalizes understanding of  HEP--written program was provided    General Comments        Pertinent Vitals/Pain Pain Assessment: No/denies pain    Home Living                      Prior Function            PT Goals (current goals can now be found in the care plan section) Progress towards PT goals: Progressing toward goals    Frequency    Min 3X/week      PT Plan      Co-evaluation             End of Session Equipment Utilized During Treatment: Gait belt Activity Tolerance: Patient tolerated treatment well Patient left: in bed;with call bell/phone within reach;Other (comment)   PT Visit Diagnosis: Unsteadiness on feet (R26.81)     Time: 7829-5621 PT Time Calculation (min) (ACUTE ONLY): 20 min  Charges:  $Gait Training: 8-22 mins                    G Codes:       Felecia Shelling  PTA [facility]  Acute  Rehab Pager  [phone]  

## 2017-02-01 NOTE — Progress Notes (Signed)
Patient ID: [name], male   DOB: [birth year], 61 y.o.   MRN: [id number]  [facility] Progress Note   Assessment/ Plan:   1. Acute kidney injury: Likely from multifactorial ATN associated with recent pancreatic abscess/relative hypotension/vancomycin in combination with Zosyn. He remains nonoliguric and today with improving creatinine/GFR. He is euvolemic on physical exam and I will replace his potassium based on morning labs. Urine studies reflective of intrinsic renal disease/ATN (FENa 2.6%). Would recommend monitoring him in the hospital for at least another 24 hours for continued improvement of renal function prior to discharge home. He appears now to be showing evidence of renal recovery after what appears to be a plateau phase of his ATN over the past 48 hours. I will order a.m. labs and see him again tomorrow. DC IV fluids and encouraged PO intake. 2. Pancreatic abscess: Status post percutaneous drainage and now on Zosyn. He continues to remain afebrile with decreasing drainage noted from percutaneous drain. Suspect transition to oral antibiotics-recommend Augmentin in combination with metronidazole at the time of discharge. 3. Hypokalemia: Unclear etiology but suspected due to limited oral intake-replace potassium via oral route. 4. Anemia: Likely anemia of acute illness/ESA resistance. He has low iron saturation but will defer iron supplementation at this time due to infection. No overt blood loss  Subjective:   Reports to be feeling fair-denies any chest pain or shortness of breath    Objective:   BP 122/68 (BP Location: Left Arm)   Pulse 76   Temp 98.2 F (36.8 C) (Oral)   Resp 16   Ht  (1.88 m)   Wt 76.2 kg (168 lb)   SpO2 98%   BMI 21.57 kg/m   Intake/Output Summary (Last 24 hours) at 02/01/17 1121 Last data filed at 02/01/17 1057  Gross per 24 hour  Intake             1735 ml  Output             1050 ml  Net              685 ml   Weight change:    Physical Exam: WUJ:WJXBJYN comfortably in bed, wife at bedside CVS: Pulse regular rhythm, normal rate Resp: Clear to auscultation, no rales/rhonchi Abd: Soft, flat, left flank drain in situ Ext: No lower extremity edema  Imaging: US Renal  Result Date: 01/31/2017 CLINICAL DATA:  Acute kidney injury EXAM: RENAL / URINARY TRACT ULTRASOUND COMPLETE COMPARISON:  Abdominal CT from 3 days ago FINDINGS: Right Kidney: Length: 13-14 cm. Echogenicity within normal limits. No mass or hydronephrosis visualized. Left Kidney: Length: 13 cm. Echogenicity within normal limits. No mass or hydronephrosis visualized. Bladder: Appears normal for degree of bladder distention. Left pleural effusion which was also seen on comparison abdominal CT. Small simple appearing ascites in the pelvis. Patient's retroperitoneal fluid collection is likely partly visible on right renal images. IMPRESSION: No acute finding.  No hydronephrosis. Electronically Signed   By: Marnee Spring M.D.   On: 01/31/2017 14:53    Labs: BMET  Recent Labs Lab 01/27/17 0741 01/27/17 8295 01/28/17 0434 01/29/17 0450 01/30/17 0441 01/30/17 0917 01/31/17 0913 02/01/17 0817  NA 126* 126* 134* 132*  --  138 137 137  K 3.7 3.6 3.5 3.5  --  3.8 4.4 3.3*  CL 93* 93* 103 104  --  104 105 106  CO2 23  --  25 23  --  GLUCOSE 136* 133*  118* 111*  --  110* 139* 116*  BUN --  CREATININE 0.52* 0.40* 0.62 0.56* 1.38* 1.60* 1.67* 1.48*  CALCIUM 8.4*  --  7.9* 7.8*  --  8.5* 8.4* 8.4*  PHOS  --   --   --   --   --   --   --  4.2   CBC  Recent Labs Lab 01/28/17 0434 01/29/17 0450 01/31/17 0528 02/01/17 0817  WBC 15.6* 9.5 12.2* 11.9*  HGB 8.8* 8.8* 8.1* 9.6*  HCT 26.7* 26.3* 25.0* 29.3*  MCV 89.6 92.3 90.3 92.4  PLT 315 283 266 291   Medications:    . clonazePAM  1.5 mg Oral QHS  . enoxaparin (LOVENOX) injection  40 mg Subcutaneous Q24H  . magic mouthwash w/lidocaine  5 mL Oral TID  .  mometasone-formoterol  2 puff Inhalation BID  . montelukast  10 mg Oral QHS  . pantoprazole  40 mg Oral QHS  . potassium chloride  20 mEq Oral BID  . protein supplement shake  11 oz Oral TID BM  . saccharomyces boulardii  250 mg Oral BID  . sertraline  200 mg Oral QHS   Zetta Bills, MD 02/01/2017, 11:21 AM

## 2017-02-01 NOTE — Progress Notes (Signed)
Occupational Therapy Treatment Patient Details Name: [name] MRN: [id number] DOB: 1956/05/26 Today's Date: 02/01/2017    History of present illness Pt was admitted with acute nausea/vomiting.  He had lap chole 12/13/16.  Had abscess cavity drainage and pancreatitis post op.  Now s/p aspiration/drainage of pancreatic pseudocyst    OT comments  Pt steady walking to bathroom and he feels comfortable with HEP.  No DME needed and pt will not need follow up OT at home:  Spoke to both pt and wife about this  Follow Up Recommendations  Supervision/Assistance - 24 hour;No OT follow up    Equipment Recommendations  None recommended by OT    Recommendations for Other Services      Precautions / Restrictions Precautions Precautions: Fall Restrictions Weight Bearing Restrictions: No       Mobility Bed Mobility Overal bed mobility: Independent                Transfers   Equipment used: None   Sit to Stand: Modified independent (Device/Increase time)              Balance                                           ADL either performed or assessed with clinical judgement   ADL                           Toilet Transfer: Supervision/safety;Ambulation;Comfort height toilet   Toileting- Clothing Manipulation and Hygiene: Modified independent;Sit to/from stand         General ADL Comments: ambulated to bathroom with IV pole.  Pt steady.  MD came in when pt got back to bed.  Returned and talked to wife and pt:  they both feel he will be fine with his standard commode at home.  Pt verbalizes understanding of HEP with levels 1 and 2 theraband.     Vision       Perception     Praxis      Cognition Arousal/Alertness: Awake/alert Behavior During Therapy: WFL for tasks assessed/performed Overall Cognitive Status: Within Functional Limits for tasks assessed                                          Exercises Other  Exercises Other Exercises: pt verbalizes understanding of HEP--written program was provided   Shoulder Instructions       General Comments      Pertinent Vitals/ Pain       Pain Assessment: No/denies pain  Home Living                                          Prior Functioning/Environment              Frequency           Progress Toward Goals  OT Goals(current goals can now be found in the care plan section)  Progress towards OT goals: Progressing toward goals (no further OT is needed at this time)     Plan      Co-evaluation  End of Session    OT Visit Diagnosis: Muscle weakness (generalized) (M62.81);Unsteadiness on feet (R26.81)   Activity Tolerance Patient tolerated treatment well   Patient Left in bed;with call bell/phone within reach;with family/visitor present   Nurse Communication          Time: 1610-9604 OT Time Calculation (min): 10 min  Charges: OT General Charges $OT Visit: 1 Procedure OT Treatments $Self Care/Home Management : 8-22 mins  Marica Otter, OTR/L [phone] 02/01/2017   [name] 02/01/2017, 10:12 AM

## 2017-02-01 NOTE — Progress Notes (Signed)
CC: Abdominal pain Subjective: He is doing well appetite is very good eating everything.  Drainage is still purulent.  Less volume recorded each day.   Objective: Vital signs in last 24 hours: Temp:  [98 F (36.7 C)-99.1 F (37.3 C)] 98.2 F (36.8 C) (04/27 0529) Pulse Rate:  [76-89] 76 (04/27 0529) Resp:  [16] 16 (04/27 0529) BP: (115-126)/(67-76) 122/68 (04/27 0529) SpO2:  [95 %-100 %] 98 % (04/27 0529) Last BM Date: 01/31/17 720 PO 50 IV 1600 urine Drain 50 BM x 2 Afebrile, VSS Labs pending Renal ultrasound 01/31/17: No acute findings no hydronephrosis left pleural effusion Intake/Output from previous day: 04/26 0701 - 04/27 0700 In: 2721.7 [P.O.:720; I.V.:1931.7; IV Piggyback:50] Out: 1650 [Urine:1600; Drains:50] Intake/Output this shift: No intake/output data recorded.  General appearance: alert, cooperative and no distress Resp: clear to auscultation bilaterally and BS down some in the bases GI: soft, non-tender; bowel sounds normal; no masses,  no organomegaly and drainage is purulent.  Lab Results:   Recent Labs  01/31/17 0528  WBC 12.2*  HGB 8.1*  HCT 25.0*  PLT 266    BMET  Recent Labs  01/30/17 0917 01/31/17 0913  NA 138 137  K 3.8 4.4  CL 104 105  CO2 25 24  GLUCOSE 110* 139*  BUN 14 14  CREATININE 1.60* 1.67*  CALCIUM 8.5* 8.4*   PT/INR No results for input(s): LABPROT, INR in the last 72 hours.   Recent Labs Lab 01/27/17 0741 01/29/17 0450 01/30/17 0917  AST ALT ALKPHOS 95 60 65  BILITOT 1.3* 0.8 0.9  PROT 6.5 4.8* 5.6*  ALBUMIN 2.3* 1.8* 2.0*     Lipase     Component Value Date/Time   LIPASE 23 01/29/2017 0450     Studies/Results: US Renal  Result Date: 01/31/2017 CLINICAL DATA:  Acute kidney injury EXAM: RENAL / URINARY TRACT ULTRASOUND COMPLETE COMPARISON:  Abdominal CT from 3 days ago FINDINGS: Right Kidney: Length: 13-14 cm. Echogenicity within normal limits. No mass or hydronephrosis  visualized. Left Kidney: Length: 13 cm. Echogenicity within normal limits. No mass or hydronephrosis visualized. Bladder: Appears normal for degree of bladder distention. Left pleural effusion which was also seen on comparison abdominal CT. Small simple appearing ascites in the pelvis. Patient's retroperitoneal fluid collection is likely partly visible on right renal images. IMPRESSION: No acute finding.  No hydronephrosis. Electronically Signed   By: Marnee Spring M.D.   On: 01/31/2017 14:53    Medications: . clonazePAM  1.5 mg Oral QHS  . enoxaparin (LOVENOX) injection  40 mg Subcutaneous Q24H  . magic mouthwash w/lidocaine  5 mL Oral TID  . mometasone-formoterol  2 puff Inhalation BID  . montelukast  10 mg Oral QHS  . pantoprazole  40 mg Oral QHS  . protein supplement shake  11 oz Oral TID BM  . saccharomyces boulardii  250 mg Oral BID  . sertraline  200 mg Oral QHS    Assessment/Plan Gallstone pancreatitis with postop pancreatic pancreatitis/pseudocyst abscess. IR placement of 12 French drain into pancreatic abscess 01/28/17 Ongoing epigastric pain nausea and vomiting 52 pound weight loss since surgery admit Severe protein calorie malnutrition - prealbumin 6.2 Status post laparoscopic cholecystectomy with IOC 12/13/16 Dr. Royston Sinner III Oral candidiasis post op Voice loss/hoarseness - post op Creatinine elevation after 3 days of vancomycin - stopping vancomycin today Post op atelectasis/pleural effusions - ?  History of anxiety History of asthma Anemia Hbg- 8.8 - 01/28/2017  8.1/25  -  01/31/17 FEN: IV fluids/ low fat diet ID: Zosyn 4/22 =>>day 4Vancomycin 4/22 =>>day 56- culture: gram-positive and gram-negative organisms on Gram stain.  Nothing predominates  DVT: Lovenox/SCD    Plan:  Home when cleared by Renal with ongoing antibiotics.     LOS: 5 days    JENNINGS,WILLARD 02/01/2017 [phone]  Agree with above. Wife in room.  They understand  concerns for Creatinine, but he looks good. Probably home in AM.  Ovidio Kin, MD, [facility] Pager: [phone] Office phone:  [phone]

## 2017-02-02 LAB — RENAL FUNCTION PANEL
ALBUMIN: 1.9 g/dL — AB (ref 3.5–5.0)
Anion gap: 5 (ref 5–15)
BUN: 10 mg/dL (ref 6–20)
CO2: 26 mmol/L (ref 22–32)
Calcium: 8.3 mg/dL — ABNORMAL LOW (ref 8.9–10.3)
Chloride: 105 mmol/L (ref 101–111)
Creatinine, Ser: 1.41 mg/dL — ABNORMAL HIGH (ref 0.61–1.24)
GFR calc Af Amer: >60 mL/min (ref >60)
GFR calc non Af Amer: 52 mL/min — ABNORMAL LOW (ref >60)
GLUCOSE: 97 mg/dL (ref 65–99)
PHOSPHORUS: 4.4 mg/dL (ref 2.5–4.6)
Potassium: 3.8 mmol/L (ref 3.5–5.1)
SODIUM: 136 mmol/L (ref 135–145)

## 2017-02-02 LAB — MAGNESIUM: Magnesium: 1.6 mg/dL — ABNORMAL LOW (ref 1.7–2.4)

## 2017-02-02 MED ORDER — METRONIDAZOLE 500 MG PO TABS: 500.0000 mg | ORAL_TABLET | Freq: Three times a day (TID) | ORAL | 0 refills | Status: DC

## 2017-02-02 MED ORDER — CIPROFLOXACIN HCL 500 MG PO TABS: 500.0000 mg | ORAL_TABLET | Freq: Two times a day (BID) | ORAL | 2 refills | Status: DC

## 2017-02-02 NOTE — Progress Notes (Signed)
Patient ID: [name], male   DOB: Oct 26, 1955, 61 y.o.   MRN: [id number]  [name] [name] Progress Note   Assessment/ Plan:   1. Acute [name] injury: Likely from multifactorial ATN associated with recent pancreatic abscess/relative hypotension/vancomycin in combination with Zosyn. Nonoliguric with sluggish improvement of renal function (this is explained likely by his discontinuation of intravenous fluid)-would recommend labs early next week and his PCPs office to follow-up on his renal recovery- at this point does not need a scheduled renal follow-up but I would be happy to see him if renal insufficiency persists. Discussed with Dr. Tish Men to DC home 2. Pancreatic abscess: Status post percutaneous drainage and now on Zosyn. He continues to remain afebrile with decreasing drainage noted from percutaneous drain. Oral outpatient antibiotics per surgical service-recommend Augmentin 875/125 mg twice a day as appropriate renal dose with anaerobic coverage. 3. Hypokalemia: Replaced via oral route. No excessive GI losses noted. 4. Anemia: Likely anemia of acute illness/ESA resistance. He has low iron saturation but will defer iron supplementation at this time due to infection. No overt blood loss  Subjective:   Denies any chest pain or shortness of breath. Reports excellent urine output overnight (charting may have been incomplete)    Objective:   BP 107/67 (BP Location: Right Arm)   Pulse 77   Temp 98 F (36.7 C) (Oral)   Resp 18   Ht  (1.88 m)   Wt 76.2 kg (168 lb)   SpO2 97%   BMI 21.57 kg/m   Intake/Output Summary (Last 24 hours) at 02/02/17 1105 Last data filed at 02/02/17 0751  Gross per 24 hour  Intake              605 ml  Output             1165 ml  Net             -560 ml   Weight change:   Physical Exam: WUJ:WJXBJYN comfortably in bed, wife at bedside CVS: Pulse regular rhythm, normal rate Resp: Clear to auscultation, no rales/rhonchi Abd: Soft,  flat, left flank drain in situ Ext: No lower extremity edema  Imaging: US Renal  Result Date: 01/31/2017 CLINICAL DATA:  Acute [name] injury EXAM: RENAL / URINARY TRACT ULTRASOUND COMPLETE COMPARISON:  Abdominal CT from 3 days ago FINDINGS: Right [name]: Length: 13-14 cm. Echogenicity within normal limits. No mass or hydronephrosis visualized. Left [name]: Length: 13 cm. Echogenicity within normal limits. No mass or hydronephrosis visualized. Bladder: Appears normal for degree of bladder distention. Left pleural effusion which was also seen on comparison abdominal CT. Small simple appearing ascites in the pelvis. Patient's retroperitoneal fluid collection is likely partly visible on right renal images. IMPRESSION: No acute finding.  No hydronephrosis. Electronically Signed   By: Marnee Spring M.D.   On: 01/31/2017 14:53    Labs: BMET  Recent Labs Lab 01/27/17 0741 01/27/17 8295 01/28/17 0434 01/29/17 0450 01/30/17 0441 01/30/17 0917 01/31/17 0913 02/01/17 0817 02/02/17 0456  NA 126* 126* 134* 132*  --  138 137 137 136  K 3.7 3.6 3.5 3.5  --  3.8 4.4 3.3* 3.8  CL 93* 93* 103 104  --  104 105 106 105  CO2 23  --  25 23  --  GLUCOSE 136* 133* 118* 111*  --  110* 139* 116* 97  BUN --  CREATININE 0.52* 0.40*  0.62 0.56* 1.38* 1.60* 1.67* 1.48* 1.41*  CALCIUM 8.4*  --  7.9* 7.8*  --  8.5* 8.4* 8.4* 8.3*  PHOS  --   --   --   --   --   --   --  4.2 4.4   CBC  Recent Labs Lab 01/28/17 0434 01/29/17 0450 01/31/17 0528 02/01/17 0817  WBC 15.6* 9.5 12.2* 11.9*  HGB 8.8* 8.8* 8.1* 9.6*  HCT 26.7* 26.3* 25.0* 29.3*  MCV 89.6 92.3 90.3 92.4  PLT 315 283 266 291   Medications:    . clonazePAM  1.5 mg Oral QHS  . enoxaparin (LOVENOX) injection  40 mg Subcutaneous Q24H  . magic mouthwash w/lidocaine  5 mL Oral TID  . mometasone-formoterol  2 puff Inhalation BID  . montelukast  10 mg Oral QHS  . pantoprazole  40 mg Oral QHS  . protein  supplement shake  11 oz Oral TID BM  . saccharomyces boulardii  250 mg Oral BID  . sertraline  200 mg Oral QHS   Zetta Bills, MD 02/02/2017, 11:05 AM

## 2017-02-02 NOTE — Progress Notes (Signed)
Discharged from floor via w/c for transport home by car. Wife & belongings with pt. No changes in assessment. [name]   

## 2017-02-02 NOTE — Progress Notes (Signed)
Patient ID: [name], male   DOB: 05-16-1956, 61 y.o.   MRN: [id number]     Subjective: Reports no problems this morning. Denies pain. Eating and drinking without difficulty. Voiding large amounts. Anxious to go home.  Objective: Vital signs in last 24 hours: Temp:  [97.9 F (36.6 C)-98 F (36.7 C)] 98 F (36.7 C) (04/28 0530) Pulse Rate:  [77-85] 77 (04/28 0530) Resp:  [16-18] 18 (04/28 0530) BP: (107-116)/(67-76) 107/67 (04/28 0530) SpO2:  [95 %-99 %] 97 % (04/28 0530) Last BM Date: 02/01/17  Intake/Output from previous day: 04/27 0701 - 04/28 0700 In: 605 [P.O.:600] Out: 965 [Urine:925; Drains:40] Intake/Output this shift: Total I/O In: -  Out: 200 [Urine:200]  General appearance: alert, cooperative and no distress GI: normal findings: soft, non-tender and Small amount of purulent drainage in the drainage bag Incision/Wound: Laparoscopic incisions clean and dry  Lab Results:   Recent Labs  01/31/17 0528 02/01/17 0817  WBC 12.2* 11.9*  HGB 8.1* 9.6*  HCT 25.0* 29.3*  PLT 266 291   BMET  Recent Labs  02/01/17 0817 02/02/17 0456  NA 137 136  K 3.3* 3.8  CL 106 105  CO2 22 26  GLUCOSE 116* 97  BUN 13 10  CREATININE 1.48* 1.41*  CALCIUM 8.4* 8.3*     Studies/Results: US Renal  Result Date: 01/31/2017 CLINICAL DATA:  Acute kidney injury EXAM: RENAL / URINARY TRACT ULTRASOUND COMPLETE COMPARISON:  Abdominal CT from 3 days ago FINDINGS: Right Kidney: Length: 13-14 cm. Echogenicity within normal limits. No mass or hydronephrosis visualized. Left Kidney: Length: 13 cm. Echogenicity within normal limits. No mass or hydronephrosis visualized. Bladder: Appears normal for degree of bladder distention. Left pleural effusion which was also seen on comparison abdominal CT. Small simple appearing ascites in the pelvis. Patient's retroperitoneal fluid collection is likely partly visible on right renal images. IMPRESSION: No acute finding.  No hydronephrosis.  Electronically Signed   By: Marnee Spring M.D.   On: 01/31/2017 14:53    Anti-infectives: Anti-infectives    Start     Dose/Rate Route Frequency Ordered Stop   01/29/17 2200  vancomycin (VANCOCIN) 1,250 mg in sodium chloride 0.9 % 250 mL IVPB  Status:  Discontinued     1,250 mg 166.7 mL/hr over 90 Minutes Intravenous Every 12 hours 01/29/17 1026 01/30/17 0846   01/29/17 0200  vancomycin (VANCOCIN) 1,250 mg in sodium chloride 0.9 % 250 mL IVPB  Status:  Discontinued     1,250 mg 166.7 mL/hr over 90 Minutes Intravenous Every 8 hours 01/28/17 1946 01/29/17 1026   01/27/17 1800  vancomycin (VANCOCIN) IVPB 1000 mg/200 mL premix  Status:  Discontinued     1,000 mg 200 mL/hr over 60 Minutes Intravenous Every 8 hours 01/27/17 1207 01/28/17 1946   01/27/17 1600  piperacillin-tazobactam (ZOSYN) IVPB 3.375 g     3.375 g 12.5 mL/hr over 240 Minutes Intravenous Every 8 hours 01/27/17 1146     01/27/17 0915  piperacillin-tazobactam (ZOSYN) IVPB 3.375 g     3.375 g 100 mL/hr over 30 Minutes Intravenous  Once 01/27/17 0905 01/27/17 1026   01/27/17 0915  vancomycin (VANCOCIN) 1,500 mg in sodium chloride 0.9 % 500 mL IVPB     1,500 mg 250 mL/hr over 120 Minutes Intravenous  Once 01/27/17 0905 01/27/17 1250      Assessment/Plan: Doing well. No signs of ongoing infection. Drinking well with good urine output and creatinine steadily improving. Appears okay for discharge today.  LOS: 6 days    Emanuele Mcwhirter T 02/02/2017

## 2017-02-04 ENCOUNTER — Other Ambulatory Visit: Payer: Self-pay | Admitting: General Surgery

## 2017-02-04 DIAGNOSIS — K863 Pseudocyst of pancreas: Secondary | ICD-10-CM

## 2017-02-05 NOTE — Discharge Summary (Signed)
Physician Discharge Summary  Patient ID: [name] MRN: [id number] DOB/AGE: January 28, 1956 61 y.o.  Admit date: 01/27/2017 Discharge date: 4/282018  Admission Diagnoses:   Discharge Diagnoses:  Gallstone pancreatitis with postop pancreatic pancreatitis/pseudocyst abscess. IR placement of 12 French drain into pancreatic abscess 01/28/17 Ongoing epigastric pain nausea and vomiting 52 pound weight loss since surgery admit Severe protein calorie malnutrition - prealbumin 6.2 Status post laparoscopic cholecystectomy with IOC 12/13/16 Dr. Royston Sinner III Oral candidiasis post op Voice loss/hoarseness - post op Acute kidney injury/multifactorial ATN Post op atelectasis/pleural effusions  History of anxiety History of asthma Anemia    Active Problems:   Pancreatic pseudocyst   PROCEDURES: IR placement 12 French drain into pancreatic abscess 01/28/17  Hospital Course:   Epigastric pain with persistent nausea and vomiting in somebody with known pancreatic pseudocysts HPI:  This is a 61 year old male admitted to the hospital 12/11/2016 with acute gallstone pancreatitis. He improved enough to undergo a laparoscopic cholecystectomy with operative cholangiogram 12/13/2016.  At the time of surgery, an abscess cavity was noted in the right upper quadrant at the edges liver and this was drained. The operative cholangiogram did not show any evidence of choledocholithiasis or biliary obstruction.  The pancreatitis continued to progress and a CT scan 12/17/2016 demonstrated worsening pancreatitis at that time. With time he did get better and was able to be discharged 12/23/2016.  He was having some abdominal pain with nausea and vomiting and 2 days ago underwent a CT scan, ordered by Dr. Levora Angel, which demonstrated several pseudocyst containing internal gas concerning for multiple peripancreatic abscesses. At the time of admission was suggested to the patient with interventional radiology for  percutaneous drainage.  Admission was postponed and the procedure was planned for tomorrow. He had more symptoms over the weekend and presented to the emergency department. He is noted have an elevation of his white blood cell count of 30,000.  Repeat CT today demonstrates similar findings as it did 2 days ago. We were asked to see him and admit him. Patient was admitted by Dr. Abbey Chatters he was seen in follow-up by Dr. Matthias Hughs and Dr. Ovidio Kin. Both were in agreement to proceed with IR intervention and drainage of pancreatic abscess. He was seen in consultation by interventional radiology and taken to the radiology department the following day and underwent drain placement as noted above. He was placed on IV antibiotics (Zosyn and vancomycin)  on admission along with IV fluid hydration. After the procedure and 24 hours of antibiotics he showed marked improvement. His nausea improved and we placed him on full liquids. We then were able to advance his diet to a low fat. He showed good improvement each day. At the end of his stay he was eating everything they would bring them. When the drain was placed it was very thick and  purulent. Over the next several days the fluid was became a more cloudy serous appearing drainage, the volume also decreased. On admission his creatinine was 0.56. After 2 days of vancomycin and was up to 1.38 and then 1.60. The maximum creatinine on 01/31/17 was 1.67.  With the bump his creatinine from 0.56-1.38 the vancomycin was discontinued. The creatinine continued to go up and a nephrology consult with Dr. Zetta Bills was obtained. It was his opinion this was consistent with ATN most likely multifactorial secondary to his recent illness, exposure to combination of Zosyn and vancomycin and iodinated intravenous contrast with some hypotension. He had a good urine output volume. 72  hours after the onset of his elevated creatinine, it started to trend down. Dr. Allena Katz followed him throughout  his hospital course. 54/28/18 his creatinine was down to 1.41. He was tolerating soft diet very well. He was afebrile vital signs were stable. Drain output was down to 40 mL per day. He was seen by Dr. Glenna Fellows after discussion with Dr. Allena Katz, recommended discharge home. Arrangements had been made for post hospitalization follow-up. This included repeat CT scan in the drain clinic, follow-up with Dr. Hortense Ramal III, and Dr. Allena Katz.  Cultures from the intrahepatic pancreatic fluids drained by IR grew out multiple organisms none predominant. And he was discharged home on by mouth Cipro.  In addition to home health versus drain care he was also getting home OT and PT.   CBC Latest Ref Rng & Units 02/01/2017 01/31/2017 01/29/2017  WBC 4.0 - 10.5 K/uL 11.9(H) 12.2(H) 9.5  Hemoglobin 13.0 - 17.0 g/dL 0.2(V) 8.1(L) 8.8(L)  Hematocrit 39.0 - 52.0 % 29.3(L) 25.0(L) 26.3(L)  Platelets 150 - 400 K/uL 291 266 283   CMP Latest Ref Rng & Units 02/02/2017 02/01/2017 01/31/2017  Glucose 65 - 99 mg/dL 97 253(G) 644(I)  BUN 6 - 20 mg/dL Creatinine 0.61 - 1.24 mg/dL 3.47(Q) 2.59(D) 6.38(V)  Sodium 135 - 145 mmol/L 136 137 137  Potassium 3.5 - 5.1 mmol/L 3.8 3.3(L) 4.4  Chloride 101 - 111 mmol/L 105 106 105  CO2 22 - 32 mmol/L Calcium 8.9 - 10.3 mg/dL 8.3(L) 8.4(L) 8.4(L)  Total Protein 6.5 - 8.1 g/dL - - -  Total Bilirubin 0.3 - 1.2 mg/dL - - -  Alkaline Phos 38 - 126 U/L - - -  AST 15 - 41 U/L - - -  ALT 17 - 63 U/L - - -   Condition on Discharge:  Improved     Disposition: 01-Home or Self Care   Allergies as of 02/02/2017   No Known Allergies     Medication List    TAKE these medications   acetaminophen 500 MG tablet Commonly known as:  TYLENOL Take 500-1,000 mg by mouth every 6 (six) hours as needed.   ADVAIR DISKUS 250-50 MCG/DOSE Aepb Generic drug:  Fluticasone-Salmeterol Inhale 1 puff into the lungs 2 (two) times daily.   ciprofloxacin 500 MG tablet Commonly known  as:  CIPRO Take 1 tablet (500 mg total) by mouth 2 (two) times daily.   clonazePAM 0.5 MG tablet Commonly known as:  KLONOPIN Take 1.5 mg by mouth at bedtime.   metroNIDAZOLE 500 MG tablet Commonly known as:  FLAGYL Take 1 tablet (500 mg total) by mouth 3 (three) times daily.   montelukast 10 MG tablet Commonly known as:  SINGULAIR Take 10 mg by mouth at bedtime.   sertraline 100 MG tablet Commonly known as:  ZOLOFT Take 200 mg by mouth at bedtime.      Follow-up Information    HASSELL III, Satira Anis, MD Follow up on 02/13/2017.   Specialty:  Interventional Radiology Why:  You have an appointment for 8:45 AM, be at the office 30 minutes early for check in.  Bring photo ID and Insurance information.  Contact information: [address] [place] [address] [phone]        Robyne Askew, MD Follow up on 02/18/2017.   Specialty:  General Surgery Why:   Your appointment is at 10:00 AM.  Bring photo ID, insurance information and be at the  office 30 minutes earlyto check in.   Contact information: [address] [place] [address] [phone]        [facility] Follow up.   Why:  nurse for drain care and physical therapy for safety Contact information: [address] [place] [address] [phone]           Signed: Sherrie George 02/05/2017, 2:45 PM

## 2017-02-07 ENCOUNTER — Other Ambulatory Visit: Payer: Self-pay | Admitting: General Surgery

## 2017-02-07 DIAGNOSIS — K863 Pseudocyst of pancreas: Secondary | ICD-10-CM

## 2017-02-08 MED FILL — NORMAL SALINE FLUSH SYRINGE: 0.9 | 6 days supply | Qty: 180 | Fill #0

## 2017-02-13 ENCOUNTER — Ambulatory Visit
Admission: RE | Admit: 2017-02-13 | Discharge: 2017-02-13 | Disposition: A | Discharge status: Home / Self Care | Payer: BC Managed Care – PPO | Source: Ambulatory Visit | Attending: General Surgery | Admitting: General Surgery

## 2017-02-13 ENCOUNTER — Other Ambulatory Visit: Payer: Self-pay | Admitting: General Surgery

## 2017-02-13 ENCOUNTER — Ambulatory Visit
Admit: 2017-02-13 | Discharge: 2017-02-13 | Disposition: A | Discharge status: Home / Self Care | Payer: BC Managed Care – PPO | Attending: Physician Assistant | Admitting: Physician Assistant

## 2017-02-13 ENCOUNTER — Other Ambulatory Visit: Payer: BC Managed Care – PPO

## 2017-02-13 DIAGNOSIS — K863 Pseudocyst of pancreas: Secondary | ICD-10-CM

## 2017-02-13 DIAGNOSIS — R490 Dysphonia: Secondary | ICD-10-CM

## 2017-02-13 HISTORY — PX: IR RADIOLOGIST EVAL & MGMT: IMG5224

## 2017-02-13 MED ORDER — IOPAMIDOL (ISOVUE-300) INJECTION 61%
50.0000 mL | Freq: Once | INTRAVENOUS | Status: AC | PRN
  Administered 2017-02-13: 50 mL via INTRAVENOUS

## 2017-02-13 MED ORDER — IOPAMIDOL (ISOVUE-300) INJECTION 61%
100.0000 mL | Freq: Once | INTRAVENOUS | Status: AC | PRN
  Administered 2017-02-13: 100 mL via INTRAVENOUS

## 2017-02-13 NOTE — Progress Notes (Signed)
Chief Complaint: Pancreatic Cyst S/P Drain 01/28/2017  Referring Physician(s): RUEAVWUJWJ,XBJYNBrahmbhatt,Parag  Supervising Physician: Gilmer MorWagner, Jaime  History of Present Illness: [name] FrederickDavid A [name] is a 61 y.o. male with history of acute gallstone pancreatitis s/p lap chole 12/13/16 with abscess cavity drainage at the edges of the liver.    He developed pancreatitis post-op which improved and he was discharged home, however he continued with poor PO intake and weight loss.   He then developed acute nausea and vomiting at home for the past 2 days and presented to his GI office.    CT Abd/Pelvis 4/19: Increased multiple organized pseudocysts within the abdomen, several of which contain internal gas. Infected pseudocysts cannot be excluded. Decreased small bilateral pleural effusions and bibasilar atelectasis.  IR was consulted for aspiration and drainage and he underwent image guided drain placment by Dr. Bonnielee HaffHoss on 01/28/2017.  He is seen in clinic today for follow up.  CT scan done today shows some improvement in the cyst.  He and his wife have been very diligent with flushing the drain 3 times per day with 5 mL saline flush and recording output.  Output continues to be about 5 - 10 mL per day. The drainage continues to be thick light green in appearance.  He continues to have some weight loss and appears very thin today. Although he does report his appetite is good and states he is hungry now.  He is still taking Cipro and Flagyl as prescribed and has refills on those.   Past Medical History:  Diagnosis Date  . Anxiety   . Asthma    uses inhaler  . Pancreatitis     Past Surgical History:  Procedure Laterality Date  . CHOLECYSTECTOMY N/A 12/13/2016   Procedure: LAPAROSCOPIC CHOLECYSTECTOMY WITH INTRAOPERATIVE CHOLANGIOGRAM;  Surgeon: Chevis PrettyPaul [name], MD;  Location: [facility];  Service: General;  Laterality: N/A;  . GUM SURGERY      Allergies: Patient has no known  allergies.  Medications: Prior to Admission medications   Medication Sig Start Date End Date Taking? Authorizing Provider  acetaminophen (TYLENOL) 500 MG tablet Take 500-1,000 mg by mouth every 6 (six) hours as needed.    [provider]  ADVAIR DISKUS 250-50 MCG/DOSE AEPB Inhale 1 puff into the lungs 2 (two) times daily. 11/06/16   [provider]  ciprofloxacin (CIPRO) 500 MG tablet Take 1 tablet (500 mg total) by mouth 2 (two) times daily. 02/02/17   Glenna FellowsHoxworth, Benjamin, MD  clonazePAM (KLONOPIN) 0.5 MG tablet Take 1.5 mg by mouth at bedtime. 11/08/16   [provider]  metroNIDAZOLE (FLAGYL) 500 MG tablet Take 1 tablet (500 mg total) by mouth 3 (three) times daily. 02/02/17   Glenna FellowsHoxworth, Benjamin, MD  montelukast (SINGULAIR) 10 MG tablet Take 10 mg by mouth at bedtime. 11/23/16   [provider]  sertraline (ZOLOFT) 100 MG tablet Take 200 mg by mouth at bedtime. 11/08/16   [provider]     Family History  Problem Relation Age of Onset  . Dementia Mother     Social History   Social History  . Marital status: Married    Spouse name: N/A  . Number of children: N/A  . Years of education: N/A   Social History Main Topics  . Smoking status: Never Smoker  . Smokeless tobacco: Never Used  . Alcohol use 0.6 oz/week    1 Glasses of wine per week  . Drug use: No  . Sexual activity: Yes   Other  Topics Concern  . Not on file   Social History Narrative  . No narrative on file   Review of Systems: A 12 point ROS discussed Review of Systems  Constitutional: Positive for activity change, fatigue and unexpected weight change. Negative for appetite change, chills and fever.  HENT: Negative.   Respiratory: Negative.   Cardiovascular: Negative.   Gastrointestinal: Negative.   Genitourinary: Negative.   Musculoskeletal: Negative.   Skin: Negative.   Neurological: Negative.   Hematological: Negative.   Psychiatric/Behavioral: Negative.      Vital Signs: There were no vitals taken for this visit.  Physical Exam  Constitutional: He is oriented to person, place, and time.  Thin, NAD  HENT:  Head: Normocephalic and atraumatic.  Neck: Normal range of motion.  Abdominal: Soft. He exhibits no distension. There is no tenderness.  Drain in place. Site looks good. No erythema or signs of infection.  Musculoskeletal: Normal range of motion.  Neurological: He is alert and oriented to person, place, and time.  Psychiatric: He has a normal mood and affect. His behavior is normal. Judgment and thought content normal.     Imaging: Ct Soft Tissue Neck W Contrast  Result Date: 02/13/2017 CLINICAL DATA:  Dysphonia.  Hoarseness.  Cholecystectomy in 12/2016. EXAM: CT NECK WITH CONTRAST TECHNIQUE: Multidetector CT imaging of the neck was performed using the standard protocol following the bolus administration of intravenous contrast. CONTRAST:  50mL ISOVUE-300 IOPAMIDOL (ISOVUE-300) INJECTION 61% COMPARISON:  None. FINDINGS: Pharynx and larynx: Numerous bilateral palatine tonsillar calcifications. No pharyngeal mass. Medial positioning of the left vocal cord with enlargement of the left laryngeal ventricle and piriform sinus. Salivary glands: No inflammation, mass, or stone. Thyroid: Unremarkable. Lymph nodes: No enlarged lymph nodes identified in the neck. Vascular: Incidental bovine aortic arch configuration, a normal variant. Major vascular structures of the neck appear patent. Limited intracranial: Unremarkable. Visualized orbits: The included inferior most portions of the orbits are grossly unremarkable. Mastoids and visualized paranasal sinuses: Bilateral maxillary sinus osseous wall thickening compatible with chronic sinusitis. Atelectasis and mild mucosal thickening involving the left maxillary sinus. Visualized mastoid air cells are clear. Skeleton: Lower cervical disc degeneration, moderate at C5-6. Mild-to-moderate cervical facet  arthrosis. 7 mm lesion in the C4 vertebral body, likely a benign hemangioma. Upper chest: Clear lung apices.  No superior mediastinal mass. Other: None. IMPRESSION: Findings of left vocal cord paralysis. No neck or superior mediastinal mass. Electronically Signed   By: Sebastian Ache M.D.   On: 02/13/2017 09:16   Ct Abdomen W Contrast  Result Date: 02/13/2017 CLINICAL DATA:  18 male with a history of infected pancreatic pseudocyst. Status post drainage 01/28/2017. EXAM: CT ABDOMEN WITH CONTRAST TECHNIQUE: Multidetector CT imaging of the abdomen was performed using the standard protocol following bolus administration of intravenous contrast. CONTRAST:  ISOVUE-300 IOPAMIDOL (ISOVUE-300) INJECTION 61% COMPARISON:  CT 01/28/2017, 01/27/2017 FINDINGS: Lower chest: Minimal scarring/atelectasis at the lung bases. Native coronary calcifications.  No pericardial fluid/ thickening. Small hiatal hernia. Hepatobiliary: Unremarkable appearance of the liver. Cholecystectomy. No intrahepatic or extrahepatic biliary ductal dilatation. Pancreas: The pancreatic tissue demonstrates uniform attenuation/ enhancement, with no focal decreased enhancement to indicate focal necrosis. The margin of the pancreas is somewhat smooth, with overall edematous appearance of the pancreatic parenchyma. Persisting peripancreatic abscess along the anterior margin the pancreas. The low-density rim enhancing collection is decreased in size from the comparison, particularly where the pigtail drainage catheter is located at the pancreatic tail. There is persisting fluid and gas collection and  this location with the collection extending along the greater curvature of the stomach to the diaphragmatic crus and also extending inferiorly along the left pericolic gutter. The largest persisting collection anterior to the pancreatic body measures 6.4 cm x 1.3 cm, decreased from a measurement of 3.0 cm x 8.6 cm on the prior. The fluid extends inferiorly,  posterior to the gastrocolic ligament with a small fluid component posterior to the transverse colon. In addition, there is small rim enhancing fluid collection within the right abdomen adjacent to segment 6 measuring smaller than the comparison, with greatest diameter of 3.0 cm. Spleen: Focal hypodensity the posterior spleen decreased in size from the comparison now measuring 18 mm, compared to 2.4 cm. Adrenals/Urinary Tract: Unremarkable appearance of adrenal glands. Unremarkable appearance of the bilateral kidneys. Stomach/Bowel: Small hiatal hernia. No focal stomach wall thickening. Pseudocysts/abscess extends from the greater curvature inferiorly within the gastrocolic ligament and posterior to the transverse colon. Vascular/Lymphatic: Minimal atherosclerotic calcifications of the distal abdominal aorta. No dissection. Proximal iliac vasculature patent. No aneurysm. Mesenteric vessels and bilateral renal arteries remain patent. There is no evidence of a pseudo cyst associated with the splenic artery. The splenic vein and the portal vein remain patent at this time. Veins in the hilum of the spleen remain patent at this time. Other: No abdominal wall hernia. Musculoskeletal: No displaced fracture. IMPRESSION: Unchanged position of pigtail drainage catheter within the left abdomen, anterior to the pancreatic tail, for drainage of pancreatic pseudocyst/abscess. Decreasing size of pancreatic pseudocyst/abscess within the lesser sac, though there are several persisting components of the abscess/fluid. Uncertain if these communicate with the space adjacent to the drainage catheter. Pancreatic parenchyma is uniform in enhancement/attenuation, with no evidence of focal necrosis, however, there is loss of the usual pancreatic contour, with an overall edematous appearance suggesting ongoing inflammation. Decreasing size of the rim enhancing fluid collection of the right abdomen adjacent to the liver. Cholecystectomy.  Aortic atherosclerosis and native coronary artery disease. Electronically Signed   By: Gilmer Mor D.O.   On: 02/13/2017 09:50   Ct Abdomen W Contrast  Result Date: 01/24/2017 CLINICAL DATA:  Followup acute biliary pancreatitis. Approximately 1 month status post cholecystectomy. EXAM: CT ABDOMEN WITH CONTRAST TECHNIQUE: Multidetector CT imaging of the abdomen was performed using the standard protocol following bolus administration of intravenous contrast. CONTRAST:  ISOVUE-300 IOPAMIDOL (ISOVUE-300) INJECTION 61% COMPARISON:  12/17/2016 FINDINGS: Lower chest: Interval decrease in small bilateral pleural effusions and bibasilar atelectasis. Hepatobiliary: No masses identified. Prior cholecystectomy noted. No evidence of biliary dilatation. Perihepatic drain has been removed since previous study. Pancreas: Mild decrease in diffuse pancreatic edema is seen since previous study. Multiple rim enhancing fluid collections are seen which show increased size and organization compared to previous studies, consistent with multiple pseudocysts. Several of these fluid collections contain internal gas, and infected pseudocysts cannot be excluded. Largest collection adjacent to pancreatic tail and extending inferiorly in the left abdomen along Gerota's fascia measures 10 x 13 cm. Spleen: Stable small peripheral low-attenuation lesions which may represent pseudocyst or lymphangioma as. No evidence of splenomegaly. Adrenals/Urinary Tract: No masses identified. No evidence of hydronephrosis. Stomach/Bowel: Stable gastric wall thickening attributable to pancreatitis. Vascular/Lymphatic: No pathologically enlarged lymph nodes identified. No abdominal aortic aneurysm. Other:  None. Musculoskeletal:  No suspicious bone lesions identified. IMPRESSION: Mild improvement in acute pancreatitis since prior study. Increased multiple organized pseudocysts within the abdomen, several of which contain internal gas. Infected pseudocysts  cannot be excluded. Decreased small bilateral pleural effusions and bibasilar  atelectasis. Electronically Signed   By: Myles Rosenthal M.D.   On: 01/24/2017 09:08   Ct Abdomen Pelvis W Contrast  Result Date: 01/27/2017 CLINICAL DATA:  Pt spouse reports that pt began having vomiting and abd pain that started suddenly. Pt spouse reports pt had CT scan on Friday and was dx with (multiple, possibly infected) pseudocyst(s) of pancreas and if vomiting or pain worsened to come to ED due to possible rupture. Scheduled for drainage on Monday. EXAM: CT ABDOMEN AND PELVIS WITH CONTRAST TECHNIQUE: Multidetector CT imaging of the abdomen and pelvis was performed using the standard protocol following bolus administration of intravenous contrast. CONTRAST:  15mL ISOVUE-300 IOPAMIDOL (ISOVUE-300) INJECTION 61%, ISOVUE-300 IOPAMIDOL (ISOVUE-300) INJECTION 61% COMPARISON:  01/24/2017 FINDINGS: Lower chest: Dependent atelectasis in the lower lobes, left greater than right. Minimal left pleural effusion. Heart is mildly enlarged. Stable appearance when compared to the recent prior study. Hepatobiliary: Liver normal in size. No mass or focal lesion. Gallbladder surgically absent. No bile duct dilation. Pancreas: Homogeneous enhancement pancreas. No pancreatic masses. There peripancreatic fluid collections that are stable from the prior study, described below. Spleen: Low-density lesion noted in the posterior aspect of the spleen measuring 2.4 cm, stable. Linear areas of low density noted in the anterior spleen, also stable. Spleen normal in size. Adrenals/Urinary Tract: Adrenal glands are unremarkable. Kidneys are normal, without renal calculi, focal lesion, or hydronephrosis. Bladder is unremarkable. Stomach/Bowel: Stomach is moderately distended. There is wall thickening of the distal antrum and pylorus and along the second portion the duodenum that is likely reactive to the adjacent inflammatory changes. Small bowel is normal  in caliber with no wall thickening or inflammatory changes. Colon is unremarkable. Normal appendix visualized. Vascular/Lymphatic: Minor infrarenal abdominal aortic atherosclerosis. No aneurysm. No other vascular abnormality. The portal vein, splenic vein and superior mesenteric veins are patent. No adenopathy. Reproductive: Unremarkable. Other: There multiple complex fluid collections fat containing bubbles of air. Collections are seen just below the esophageal hiatus, extending along the posterior margin of the stomach and adjacent to the distal stomach and duodenum. Complex fluid collections surround the pancreas and extend below this along the small bowel mesenteries posterior to the transverse colon. The largest collection extends along the left pericolic gutter into the left pelvis. This measures 9.7 x 4.9 cm in greatest transverse dimension. At the inferior margin of the spleen, a portion of the collection along the anterior pararenal fascia measures 11 cm x 3.6 cm. These collections are unchanged when compared to the prior CT. There is no free air. Musculoskeletal: No fracture or acute finding. No osteoblastic or osteolytic lesions. IMPRESSION: 1. Multiple fluid collections as detailed above, which containing bubbles of air, unchanged from the most recent prior CT scan. The collections are suspicious for multiple pancreatitis related abscesses. 2. No new abnormalities since the previous exam.  No free air. Electronically Signed   By: Amie Portland M.D.   On: 01/27/2017 08:59   US Renal  Result Date: 01/31/2017 CLINICAL DATA:  Acute kidney injury EXAM: RENAL / URINARY TRACT ULTRASOUND COMPLETE COMPARISON:  Abdominal CT from 3 days ago FINDINGS: Right Kidney: Length: 13-14 cm. Echogenicity within normal limits. No mass or hydronephrosis visualized. Left Kidney: Length: 13 cm. Echogenicity within normal limits. No mass or hydronephrosis visualized. Bladder: Appears normal for degree of bladder distention.  Left pleural effusion which was also seen on comparison abdominal CT. Small simple appearing ascites in the pelvis. Patient's retroperitoneal fluid collection is likely partly visible on right renal  images. IMPRESSION: No acute finding.  No hydronephrosis. Electronically Signed   By: Marnee Spring M.D.   On: 01/31/2017 14:53   Ct Image Guided Drainage By Percutaneous Catheter  Result Date: 01/28/2017 INDICATION: Pancreatic abscess drain EXAM: CT GUIDED DRAINAGE OF PANCREATIC ABSCESS MEDICATIONS: The patient is currently admitted to the hospital and receiving intravenous antibiotics. The antibiotics were administered within an appropriate time frame prior to the initiation of the procedure. ANESTHESIA/SEDATION: Three mg IV Versed 125 mcg IV Fentanyl Moderate Sedation Time:  20 The patient was continuously monitored during the procedure by the interventional radiology nurse under my direct supervision. COMPLICATIONS: None immediate. TECHNIQUE: Informed written consent was obtained from the patient after a thorough discussion of the procedural risks, benefits and alternatives. All questions were addressed. Maximal Sterile Barrier Technique was utilized including caps, mask, sterile gowns, sterile gloves, sterile drape, hand hygiene and skin antiseptic. A timeout was performed prior to the initiation of the procedure. PROCEDURE: The left flank was prepped with ChloraPrep in a sterile fashion, and a sterile drape was applied covering the operative field. A sterile gown and sterile gloves were used for the procedure. Local anesthesia was provided with 1% Lidocaine. Under CT guidance, an 18 gauge needle was advanced into the pancreatic abscess and removed over an Amplatz wire. Twelve [place] dilator followed by a 12 [place] drain were inserted. It was looped and string fixed then sewn to the skin. Frank pus was aspirated. FINDINGS: Images document 34 French drain placement into a pancreatic abscess. IMPRESSION:  Successful 12 French left flank pancreatic abscess drain. Electronically Signed   By: Jolaine Click M.D.   On: 01/28/2017 10:39    Labs:  CBC:  Recent Labs  01/28/17 0434 01/29/17 0450 01/31/17 0528 02/01/17 0817  WBC 15.6* 9.5 12.2* 11.9*  HGB 8.8* 8.8* 8.1* 9.6*  HCT 26.7* 26.3* 25.0* 29.3*  PLT 315 283 266 291    COAGS: No results for input(s): INR, APTT in the last 8760 hours.  BMP:  Recent Labs  01/30/17 0917 01/31/17 0913 02/01/17 0817 02/02/17 0456  NA 138 137 137 136  K 3.8 4.4 3.3* 3.8  CL 104 105 106 105  CO2 25 24 22 26   GLUCOSE 110* 139* 116* 97  BUN 14 14 13 10   CALCIUM 8.5* 8.4* 8.4* 8.3*  CREATININE 1.60* 1.67* 1.48* 1.41*  GFRNONAA 45* 42* 49* 52*  GFRAA 52* 49* 57* >60    LIVER FUNCTION TESTS:  Recent Labs  12/22/16 0417 01/27/17 0741 01/29/17 0450 01/30/17 0917 02/01/17 0817 02/02/17 0456  BILITOT 1.4* 1.3* 0.8 0.9  --   --   AST 29 28 15 18   --   --   ALT 37 30 21 19   --   --   ALKPHOS 96 95 60 65  --   --   PROT 4.9* 6.5 4.8* 5.6*  --   --   ALBUMIN 1.8* 2.3* 1.8* 2.0* 2.2* 1.9*    TUMOR MARKERS: No results for input(s): AFPTM, CEA, CA199, CHROMGRNA in the last 8760 hours.  Assessment:  Pancreatic pseudocyst  CT reviewed by Dr.Wagner today.  Some improvement on CT scan today.  Continue antibiotics for now  Continue drain flushes 3 times per day with 5 mL saline and continue could record keeping with output totals.  Will have patient return to the hospital in 2 weeks for drain injection and exchange.  New gravity bag placed at patient's request.   Electronically Signed: Gwynneth Macleod PA-C 02/13/2017, 10:06  AM   Please refer to Dr. Kenna Gilbert attestation of this note for management and plan.

## 2017-02-14 ENCOUNTER — Other Ambulatory Visit (HOSPITAL_COMMUNITY): Payer: Self-pay | Admitting: Interventional Radiology

## 2017-02-14 DIAGNOSIS — K863 Pseudocyst of pancreas: Secondary | ICD-10-CM

## 2017-02-15 MED FILL — NORMAL SALINE FLUSH SYRINGE: 0.9 | 10 days supply | Qty: 300 | Fill #0

## 2017-02-26 ENCOUNTER — Ambulatory Visit (HOSPITAL_COMMUNITY): Payer: BC Managed Care – PPO

## 2017-02-27 ENCOUNTER — Encounter (HOSPITAL_COMMUNITY): Payer: Self-pay | Admitting: Interventional Radiology

## 2017-02-27 ENCOUNTER — Ambulatory Visit (HOSPITAL_COMMUNITY)
Admission: RE | Admit: 2017-02-27 | Discharge: 2017-02-27 | Disposition: A | Discharge status: Home / Self Care | Payer: BC Managed Care – PPO | Source: Ambulatory Visit | Attending: Interventional Radiology | Admitting: Interventional Radiology

## 2017-02-27 DIAGNOSIS — K863 Pseudocyst of pancreas: Secondary | ICD-10-CM | POA: Diagnosis not present

## 2017-02-27 DIAGNOSIS — K859 Acute pancreatitis without necrosis or infection, unspecified: Secondary | ICD-10-CM | POA: Diagnosis not present

## 2017-02-27 DIAGNOSIS — Z4803 Encounter for change or removal of drains: Secondary | ICD-10-CM | POA: Diagnosis present

## 2017-02-27 HISTORY — PX: IR CATHETER TUBE CHANGE: IMG717

## 2017-02-27 MED ORDER — LIDOCAINE HCL 1 % IJ SOLN
INTRAMUSCULAR | Status: DC | PRN
  Administered 2017-02-27: 8 mL

## 2017-02-27 MED ORDER — LIDOCAINE HCL 1 % IJ SOLN
INTRAMUSCULAR | Status: AC
  Filled 2017-02-27: qty 20

## 2017-02-27 MED ORDER — IOPAMIDOL (ISOVUE-300) INJECTION 61%
INTRAVENOUS | Status: AC
  Administered 2017-02-27: 30 mL
  Filled 2017-02-27: qty 50

## 2017-02-28 ENCOUNTER — Other Ambulatory Visit (HOSPITAL_COMMUNITY): Payer: Self-pay | Admitting: Interventional Radiology

## 2017-02-28 ENCOUNTER — Encounter (HOSPITAL_COMMUNITY): Payer: Self-pay

## 2017-02-28 DIAGNOSIS — K863 Pseudocyst of pancreas: Secondary | ICD-10-CM

## 2017-02-28 MED FILL — NORMAL SALINE FLUSH SYRINGE: 0.9 | 10 days supply | Qty: 300 | Fill #1

## 2017-03-05 ENCOUNTER — Other Ambulatory Visit: Payer: Self-pay | Admitting: General Surgery

## 2017-03-05 DIAGNOSIS — K863 Pseudocyst of pancreas: Secondary | ICD-10-CM

## 2017-03-11 ENCOUNTER — Encounter (HOSPITAL_COMMUNITY): Payer: Self-pay

## 2017-03-11 ENCOUNTER — Other Ambulatory Visit (HOSPITAL_COMMUNITY): Payer: Self-pay | Admitting: Interventional Radiology

## 2017-03-11 ENCOUNTER — Ambulatory Visit (HOSPITAL_COMMUNITY)
Admission: RE | Admit: 2017-03-11 | Discharge: 2017-03-11 | Disposition: A | Discharge status: Home / Self Care | Payer: BC Managed Care – PPO | Source: Ambulatory Visit | Attending: Interventional Radiology | Admitting: Interventional Radiology

## 2017-03-11 DIAGNOSIS — K858 Other acute pancreatitis without necrosis or infection: Secondary | ICD-10-CM

## 2017-03-11 NOTE — Progress Notes (Signed)
BP 118/73   Pulse 88   Temp 98.1 F (36.7 C)   Resp 20  Check of (L)abd pancreatic pseudocyst drain. Pt reported bloody output this am. Drain intact, site clean. Some dark bloody stranding in tubing, otherwise typical turbid yellow output, though minimal. Suspect pseudocyst cavity is very small and may be irritated by flushes.  Pt to keep flushing, though just once daily with only 5cc NS He is scheduled for CT abdomen tomorrow at clinic.  Brayton ElKevin Romond Pipkins PA-C Interventional Radiology 03/11/2017 9:41 AM

## 2017-03-12 ENCOUNTER — Ambulatory Visit
Admission: RE | Admit: 2017-03-12 | Discharge: 2017-03-12 | Disposition: A | Discharge status: Home / Self Care | Payer: BC Managed Care – PPO | Source: Ambulatory Visit | Attending: General Surgery | Admitting: General Surgery

## 2017-03-12 ENCOUNTER — Other Ambulatory Visit: Payer: Self-pay | Admitting: General Surgery

## 2017-03-12 ENCOUNTER — Other Ambulatory Visit (HOSPITAL_COMMUNITY): Payer: Self-pay | Admitting: Interventional Radiology

## 2017-03-12 DIAGNOSIS — K859 Acute pancreatitis without necrosis or infection, unspecified: Secondary | ICD-10-CM

## 2017-03-12 DIAGNOSIS — K863 Pseudocyst of pancreas: Secondary | ICD-10-CM

## 2017-03-12 HISTORY — PX: IR RADIOLOGIST EVAL & MGMT: IMG5224

## 2017-03-12 MED ORDER — IOPAMIDOL (ISOVUE-300) INJECTION 61%
100.0000 mL | Freq: Once | INTRAVENOUS | Status: AC | PRN
  Administered 2017-03-12: 100 mL via INTRAVENOUS

## 2017-03-12 NOTE — Progress Notes (Signed)
Referring Physician(s):  Toth,Paul III  Chief Complaint: The patient is seen in follow up today s/p pancreatic cyst s/p drain placement 01/28/17, last exchanged 02/27/17  History of present illness: [name] is a [age] y.o. male with history of acute gallstone pancreatitis s/p lap chole 12/13/16 with abscess cavity drainage at the edges of the liver.  Patient subsequently developed pancreatitis.   CT Abd/Pelvis 4/19 showed: -Increased multiple organized pseudocysts within the abdomen, several of which contain internal gas. Infected pseudocysts cannot be excluded. Decreased small bilateral pleural effusions and bibasilar atelectasis  IR was consulted for aspiration and drainage and he underwent image-guided drain placement by Dr. Bonnielee HaffHoss 01/28/17.   Patient was last seen in clinic 02/13/17 and was stable with continued output.   CT scan Abdomen 5/9 showed: -Decreasing size of pancreatic pseudocyst/abscess within the lesser sac, though there are several persisting components of the abscess/fluid. Uncertain if these communicate with the space adjacent to the drainage catheter. -Pancreatic parenchyma is uniform in enhancement/attenuation, with no evidence of focal necrosis, however, there is loss of the usual pancreatic contour, with an overall edematous appearance suggesting ongoing inflammation. -Decreasing size of the rim enhancing fluid collection of the right abdomen adjacent to the liver.  [facility] for successful drain exchange and repositioning 5/23 within the largest fluid collection.   Patient states he initially had large volumes of output from his drain or 45-70 mL per day. He was told to stop flushing it three times daily and switch to 5 mL once daily.  Since then he has had significant decrease in output volume.  Yesterday, patient began to have some blood-tinged output from the drain.  He was concerned and [facility] where was assessed.   Drain and output stable.  He was reassured and encouraged to keep his appointment today.  Patient and wife state they are concerned about the decreased output as well as bloody nature of the output.  They have a GI consult with [facility] this week. Wife has been caring for drain site daily with dressing changes and continues to flush drain once daily with 5 mL.  Patient states he has soreness related to drain, but no pain and has been able to eat well with 20+ lbs of recent weight gain. He denies fever, chills, abdominal pain, nausea, or vomiting.  He has approximately 2 days of antibiotics remaining.   Past Medical History:  Diagnosis Date  . Anxiety   . Asthma    uses inhaler  . Pancreatitis     Past Surgical History:  Procedure Laterality Date  . CHOLECYSTECTOMY N/A 12/13/2016   Procedure: LAPAROSCOPIC CHOLECYSTECTOMY WITH INTRAOPERATIVE CHOLANGIOGRAM;  Surgeon: Chevis PrettyPaul [name], MD;  Location: [facility];  Service: General;  Laterality: N/A;  . GUM SURGERY    . IR CATHETER TUBE CHANGE  02/27/2017    Allergies: Patient has no known allergies.  Medications: Prior to Admission medications   Medication Sig Start Date End Date Taking? Authorizing Provider  acetaminophen (TYLENOL) 500 MG tablet Take 500-1,000 mg by mouth every 6 (six) hours as needed.    [provider]  ADVAIR DISKUS 250-50 MCG/DOSE AEPB Inhale 1 puff into the lungs 2 (two) times daily. 11/06/16   [provider]  ciprofloxacin (CIPRO) 500 MG tablet Take 1 tablet (500 mg total) by mouth 2 (two) times daily. 02/02/17   Glenna FellowsHoxworth, Benjamin, MD  clonazePAM (KLONOPIN) 0.5 MG tablet Take 1.5 mg by mouth at bedtime.  11/08/16   [provider]  metroNIDAZOLE (FLAGYL) 500 MG tablet Take 1 tablet (500 mg total) by mouth 3 (three) times daily. 02/02/17   Glenna Fellows, MD  montelukast (SINGULAIR) 10 MG tablet Take 10 mg by mouth at bedtime. 11/23/16   [provider]  sertraline (ZOLOFT) 100  MG tablet Take 200 mg by mouth at bedtime. 11/08/16   [provider]     Family History  Problem Relation Age of Onset  . Dementia Mother     Social History   Social History  . Marital status: Married    Spouse name: N/A  . Number of children: N/A  . Years of education: N/A   Social History Main Topics  . Smoking status: Never Smoker  . Smokeless tobacco: Never Used  . Alcohol use 0.6 oz/week    1 Glasses of wine per week  . Drug use: No  . Sexual activity: Yes   Other Topics Concern  . Not on file   Social History Narrative  . No narrative on file     Vital Signs: BP 90/68   Pulse 77   Temp 97.8 F (36.6 C) (Oral)   Resp 14   Ht 6\' 2"  (1.88 m)   Wt 169 lb (76.7 kg)   SpO2 99%   BMI 21.70 kg/m   Physical Exam  Alert, NAD Hoarse voice Abdomen:  Non-tender, LUQ drain remain in place.  Site intact without erythema or warmth.  Blood-tinged output of 0-2 mL daily.  Imaging: No results found.  Labs:  CBC:  Recent Labs  01/28/17 0434 01/29/17 0450 01/31/17 0528 02/01/17 0817  WBC 15.6* 9.5 12.2* 11.9*  HGB 8.8* 8.8* 8.1* 9.6*  HCT 26.7* 26.3* 25.0* 29.3*  PLT 315 283 266 291    COAGS: No results for input(s): INR, APTT in the last 8760 hours.  BMP:  Recent Labs  01/30/17 0917 01/31/17 0913 02/01/17 0817 02/02/17 0456  NA 138 137 137 136  K 3.8 4.4 3.3* 3.8  CL 104 105 106 105  CO2 25 24 22 26   GLUCOSE 110* 139* 116* 97  BUN 14 14 13 10   CALCIUM 8.5* 8.4* 8.4* 8.3*  CREATININE 1.60* 1.67* 1.48* 1.41*  GFRNONAA 45* 42* 49* 52*  GFRAA 52* 49* 57* >60    LIVER FUNCTION TESTS:  Recent Labs  12/22/16 0417 01/27/17 0741 01/29/17 0450 01/30/17 0917 02/01/17 0817 02/02/17 0456  BILITOT 1.4* 1.3* 0.8 0.9  --   --   AST 29 28 15 18   --   --   ALT 37 30 21 19   --   --   ALKPHOS 96 95 60 65  --   --   PROT 4.9* 6.5 4.8* 5.6*  --   --   ALBUMIN 1.8* 2.3* 1.8* 2.0* 2.2* 1.9*    Assessment: Pancreatic pseudocyst Patient  with continued output from his pancreatic drain.  Output has decreased significantly and become blood-tinged.  CT reviewed with Dr. Deanne Coffer today. Improvement noted in fluid collection, however drain to remain.  Patient and wife with several questions which are answered.   Patient is instructed to keep recording output, continue flushing with 5mL saline daily.  Patient is to return to clinic in 3-4 weeks for repeat CT scan.  Patient is also followed by surgery and GI.  He maintains routine follow-up.  Signed: Hoyt Koch, PA 03/12/2017, 1:29 PM   Please refer to Dr. Deanne Coffer attestation of this note for management and plan.

## 2017-03-22 ENCOUNTER — Encounter: Payer: Self-pay | Admitting: Interventional Radiology

## 2017-04-09 ENCOUNTER — Other Ambulatory Visit: Payer: BC Managed Care – PPO

## 2017-04-11 ENCOUNTER — Other Ambulatory Visit: Payer: BC Managed Care – PPO

## 2017-11-01 ENCOUNTER — Encounter (HOSPITAL_COMMUNITY): Payer: Self-pay | Admitting: Emergency Medicine

## 2017-11-01 ENCOUNTER — Emergency Department (HOSPITAL_COMMUNITY): Payer: BC Managed Care – PPO

## 2017-11-01 ENCOUNTER — Other Ambulatory Visit: Payer: Self-pay

## 2017-11-01 ENCOUNTER — Inpatient Hospital Stay (HOSPITAL_COMMUNITY)
Admission: EM | Admit: 2017-11-01 | Discharge: 2017-11-03 | DRG: 440 | Disposition: A | Discharge status: Home / Self Care | Payer: BC Managed Care – PPO | Attending: Internal Medicine | Admitting: Internal Medicine

## 2017-11-01 DIAGNOSIS — K859 Acute pancreatitis without necrosis or infection, unspecified: Principal | ICD-10-CM | POA: Diagnosis present

## 2017-11-01 DIAGNOSIS — Z79899 Other long term (current) drug therapy: Secondary | ICD-10-CM | POA: Diagnosis not present

## 2017-11-01 DIAGNOSIS — F419 Anxiety disorder, unspecified: Secondary | ICD-10-CM | POA: Diagnosis present

## 2017-11-01 DIAGNOSIS — Z8711 Personal history of peptic ulcer disease: Secondary | ICD-10-CM | POA: Diagnosis not present

## 2017-11-01 DIAGNOSIS — J4520 Mild intermittent asthma, uncomplicated: Secondary | ICD-10-CM | POA: Diagnosis not present

## 2017-11-01 DIAGNOSIS — Z9049 Acquired absence of other specified parts of digestive tract: Secondary | ICD-10-CM

## 2017-11-01 DIAGNOSIS — E860 Dehydration: Secondary | ICD-10-CM | POA: Diagnosis not present

## 2017-11-01 DIAGNOSIS — Z7951 Long term (current) use of inhaled steroids: Secondary | ICD-10-CM

## 2017-11-01 DIAGNOSIS — K219 Gastro-esophageal reflux disease without esophagitis: Secondary | ICD-10-CM | POA: Diagnosis not present

## 2017-11-01 DIAGNOSIS — D72829 Elevated white blood cell count, unspecified: Secondary | ICD-10-CM | POA: Diagnosis not present

## 2017-11-01 LAB — DIFFERENTIAL
BASOS PCT: 0 %
Basophils Absolute: 0 10*3/uL (ref 0.0–0.1)
EOS ABS: 0.1 10*3/uL (ref 0.0–0.7)
Eosinophils Relative: 1 %
Lymphocytes Relative: 7 %
Lymphs Abs: 1.5 10*3/uL (ref 0.7–4.0)
MONOS PCT: 5 %
Monocytes Absolute: 1.2 10*3/uL — ABNORMAL HIGH (ref 0.1–1.0)
NEUTROS PCT: 87 %
Neutro Abs: 19.1 10*3/uL — ABNORMAL HIGH (ref 1.7–7.7)

## 2017-11-01 LAB — CBC
HCT: 35.7 % — ABNORMAL LOW (ref 39.0–52.0)
HEMATOCRIT: 41.7 % (ref 39.0–52.0)
Hemoglobin: 13.5 g/dL (ref 13.0–17.0)
Hemoglobin: 11.6 g/dL — ABNORMAL LOW (ref 13.0–17.0)
MCH: 28 pg (ref 26.0–34.0)
MCH: 28.1 pg (ref 26.0–34.0)
MCHC: 32.4 g/dL (ref 30.0–36.0)
MCHC: 32.5 g/dL (ref 30.0–36.0)
MCV: 86.5 fL (ref 78.0–100.0)
MCV: 86.4 fL (ref 78.0–100.0)
PLATELETS: 163 10*3/uL (ref 150–400)
PLATELETS: 228 10*3/uL (ref 150–400)
RBC: 4.82 MIL/uL (ref 4.22–5.81)
RBC: 4.13 MIL/uL — AB (ref 4.22–5.81)
RDW: 17.2 % — AB (ref 11.5–15.5)
RDW: 17.4 % — ABNORMAL HIGH (ref 11.5–15.5)
WBC: 22.2 10*3/uL — ABNORMAL HIGH (ref 4.0–10.5)
WBC: 15.1 10*3/uL — ABNORMAL HIGH (ref 4.0–10.5)

## 2017-11-01 LAB — COMPREHENSIVE METABOLIC PANEL
ALBUMIN: 3.8 g/dL (ref 3.5–5.0)
ALK PHOS: 60 U/L (ref 38–126)
ALT: 17 U/L (ref 17–63)
AST: 24 U/L (ref 15–41)
Anion gap: 8 (ref 5–15)
BILIRUBIN TOTAL: 0.5 mg/dL (ref 0.3–1.2)
BUN: 19 mg/dL (ref 6–20)
CALCIUM: 9 mg/dL (ref 8.9–10.3)
CO2: 26 mmol/L (ref 22–32)
CREATININE: 0.96 mg/dL (ref 0.61–1.24)
Chloride: 102 mmol/L (ref 101–111)
GFR calc Af Amer: >60 mL/min (ref >60)
GFR calc non Af Amer: >60 mL/min (ref >60)
GLUCOSE: 136 mg/dL — AB (ref 65–99)
Potassium: 3.5 mmol/L (ref 3.5–5.1)
Sodium: 136 mmol/L (ref 135–145)
TOTAL PROTEIN: 7.1 g/dL (ref 6.5–8.1)

## 2017-11-01 LAB — URINALYSIS, ROUTINE W REFLEX MICROSCOPIC
BILIRUBIN URINE: NEGATIVE
Glucose, UA: NEGATIVE mg/dL
Ketones, ur: NEGATIVE mg/dL
Leukocytes, UA: NEGATIVE
NITRITE: NEGATIVE
PH: 7 (ref 5.0–8.0)
Protein, ur: NEGATIVE mg/dL
Squamous Epithelial / LPF: NONE SEEN
WBC, UA: NONE SEEN WBC/hpf (ref 0–5)

## 2017-11-01 LAB — PHOSPHORUS: Phosphorus: 2.9 mg/dL (ref 2.5–4.6)

## 2017-11-01 LAB — I-STAT CG4 LACTIC ACID, ED: LACTIC ACID, VENOUS: 1.53 mmol/L (ref 0.5–1.9)

## 2017-11-01 LAB — HEMOGLOBIN A1C
Hgb A1c MFr Bld: 5.9 % — ABNORMAL HIGH (ref 4.8–5.6)
MEAN PLASMA GLUCOSE: 122.63 mg/dL

## 2017-11-01 LAB — LIPASE, BLOOD: Lipase: 3388 U/L — ABNORMAL HIGH (ref 11–51)

## 2017-11-01 LAB — MAGNESIUM: MAGNESIUM: 1.6 mg/dL — AB (ref 1.7–2.4)

## 2017-11-01 MED ORDER — ONDANSETRON HCL 4 MG/2ML IJ SOLN: 4.0000 mg | Freq: Four times a day (QID) | INTRAMUSCULAR | Status: DC | PRN

## 2017-11-01 MED ORDER — FENTANYL CITRATE (PF) 100 MCG/2ML IJ SOLN
50.0000 ug | Freq: Once | INTRAMUSCULAR | Status: AC
  Administered 2017-11-01: 50 ug via INTRAVENOUS
  Filled 2017-11-01: qty 2

## 2017-11-01 MED ORDER — SERTRALINE HCL 50 MG PO TABS
200.0000 mg | ORAL_TABLET | Freq: Every day | ORAL | Status: DC
  Administered 2017-11-01 – 2017-11-02 (×2): 200 mg via ORAL
  Filled 2017-11-01 (×2): qty 4

## 2017-11-01 MED ORDER — KETOROLAC TROMETHAMINE 30 MG/ML IJ SOLN: 30.0000 mg | Freq: Three times a day (TID) | INTRAMUSCULAR | Status: DC | PRN

## 2017-11-01 MED ORDER — TRAZODONE HCL 50 MG PO TABS
50.0000 mg | ORAL_TABLET | Freq: Every evening | ORAL | Status: DC | PRN
  Administered 2017-11-01 – 2017-11-02 (×2): 50 mg via ORAL
  Filled 2017-11-01 (×2): qty 1

## 2017-11-01 MED ORDER — IOPAMIDOL (ISOVUE-300) INJECTION 61%
100.0000 mL | Freq: Once | INTRAVENOUS | Status: AC | PRN
  Administered 2017-11-01: 100 mL via INTRAVENOUS

## 2017-11-01 MED ORDER — SODIUM CHLORIDE 0.9 % IV BOLUS (SEPSIS)
1000.0000 mL | Freq: Once | INTRAVENOUS | Status: AC
  Administered 2017-11-01: 1000 mL via INTRAVENOUS

## 2017-11-01 MED ORDER — ONDANSETRON HCL 4 MG/2ML IJ SOLN
4.0000 mg | Freq: Once | INTRAMUSCULAR | Status: AC | PRN
  Administered 2017-11-01: 4 mg via INTRAVENOUS
  Filled 2017-11-01: qty 2

## 2017-11-01 MED ORDER — SODIUM CHLORIDE 0.9 % IV SOLN
INTRAVENOUS | Status: DC
  Administered 2017-11-01 – 2017-11-02 (×3): via INTRAVENOUS

## 2017-11-01 MED ORDER — MOMETASONE FURO-FORMOTEROL FUM 200-5 MCG/ACT IN AERO
2.0000 | INHALATION_SPRAY | Freq: Two times a day (BID) | RESPIRATORY_TRACT | Status: DC
  Administered 2017-11-01 – 2017-11-03 (×4): 2 via RESPIRATORY_TRACT
  Filled 2017-11-01: qty 8.8

## 2017-11-01 MED ORDER — ACETAMINOPHEN 325 MG PO TABS
650.0000 mg | ORAL_TABLET | Freq: Four times a day (QID) | ORAL | Status: DC | PRN
  Administered 2017-11-01 – 2017-11-02 (×3): 650 mg via ORAL
  Filled 2017-11-01 (×3): qty 2

## 2017-11-01 MED ORDER — CLONAZEPAM 0.5 MG PO TABS
1.5000 mg | ORAL_TABLET | Freq: Every day | ORAL | Status: DC
  Administered 2017-11-01 – 2017-11-02 (×2): 1.5 mg via ORAL
  Filled 2017-11-01 (×2): qty 3

## 2017-11-01 MED ORDER — ACETAMINOPHEN 325 MG PO TABS
650.0000 mg | ORAL_TABLET | Freq: Once | ORAL | Status: AC
  Administered 2017-11-01: 650 mg via ORAL
  Filled 2017-11-01: qty 2

## 2017-11-01 MED ORDER — ONDANSETRON HCL 4 MG PO TABS: 4.0000 mg | ORAL_TABLET | Freq: Four times a day (QID) | ORAL | Status: DC | PRN

## 2017-11-01 MED ORDER — MONTELUKAST SODIUM 10 MG PO TABS
10.0000 mg | ORAL_TABLET | Freq: Every day | ORAL | Status: DC
  Administered 2017-11-01 – 2017-11-02 (×2): 10 mg via ORAL
  Filled 2017-11-01 (×2): qty 1

## 2017-11-01 MED ORDER — HYDROCODONE-ACETAMINOPHEN 5-325 MG PO TABS: 1.0000 | ORAL_TABLET | Freq: Four times a day (QID) | ORAL | Status: DC | PRN

## 2017-11-01 MED ORDER — IPRATROPIUM-ALBUTEROL 0.5-2.5 (3) MG/3ML IN SOLN: 3.0000 mL | Freq: Four times a day (QID) | RESPIRATORY_TRACT | Status: DC | PRN

## 2017-11-01 MED ORDER — PANTOPRAZOLE SODIUM 40 MG PO TBEC
40.0000 mg | DELAYED_RELEASE_TABLET | Freq: Two times a day (BID) | ORAL | Status: DC
  Administered 2017-11-01 – 2017-11-03 (×4): 40 mg via ORAL
  Filled 2017-11-01 (×4): qty 1

## 2017-11-01 MED ORDER — ENOXAPARIN SODIUM 40 MG/0.4ML ~~LOC~~ SOLN
40.0000 mg | SUBCUTANEOUS | Status: DC
  Administered 2017-11-01 – 2017-11-02 (×2): 40 mg via SUBCUTANEOUS
  Filled 2017-11-01 (×2): qty 0.4

## 2017-11-01 MED ORDER — IOPAMIDOL (ISOVUE-300) INJECTION 61%
INTRAVENOUS | Status: AC
  Filled 2017-11-01: qty 100

## 2017-11-01 NOTE — H&P (Signed)
History and Physical    [name] [name] DOB: Sep 09, 1956 DOA: 11/01/2017  PCP: Tally Joe, MD   I have briefly reviewed patients previous medical reports in [facility].  Patient coming from: Home  Chief Complaint: Nausea, vomiting and abdominal pain.  HPI: [name] is a 62 year old male with a past medical history significant for anxiety, asthma, gastroesophageal reflux disease, anxiety and prior history of gallstone pancreatitis with pseudocyst complication in 2018: Presented to the ED with nausea, vomiting, epigastric abdominal pain.  Patient pain not obtain at it worse, dull and constant in nature, radiated to his back and associated with nausea and vomiting.  Patient reported worsening with food intake and nothing really makes it better.  Based on location and prior history he felt that it was associated with his pancreas again. Patient denies hematemesis, chest pain, palpitation, headaches, dysuria, hematuria, melena, hematochezia or any other complaints.  ED Course: Lipase elevated in the 3000 range, patient CT scan demonstrating acute pancreatitis; UA with increase specific gravity suggesting dehydration And elevated WBCs..  IV fluid resuscitation initiated, IV analgesics and antiemetics provided.  [facility] hospitalist consulted to admit the patient for further evaluation and treatment.  Review of Systems:  All other systems reviewed and apart from HPI, are negative.  Past Medical History:  Diagnosis Date  . Anxiety   . Asthma    uses inhaler  . Pancreatitis     Past Surgical History:  Procedure Laterality Date  . CHOLECYSTECTOMY N/A 12/13/2016   Procedure: LAPAROSCOPIC CHOLECYSTECTOMY WITH INTRAOPERATIVE CHOLANGIOGRAM;  Surgeon: Chevis Pretty III, MD;  Location: [facility];  Service: General;  Laterality: N/A;  . GUM SURGERY    . IR CATHETER TUBE CHANGE  02/27/2017  . IR RADIOLOGIST EVAL & MGMT  02/13/2017  . IR RADIOLOGIST EVAL & MGMT  03/12/2017    Social  History  reports that  has never smoked. he has never used smokeless tobacco. He reports that he drinks alcohol very occasionally (may be a small amount of wine once a month). He reports that he does not use recreational drugs or tobacco.  No Known Allergies  Family History  Problem Relation Age of Onset  . Dementia Mother     Prior to Admission medications   Medication Sig Start Date End Date Taking? Authorizing Provider  acetaminophen (TYLENOL) 325 MG tablet Take 650 mg by mouth every 6 (six) hours as needed for mild pain.   Yes [provider]  ADVAIR DISKUS 250-50 MCG/DOSE AEPB Inhale 1 puff into the lungs 2 (two) times daily. 11/06/16  Yes [provider]  clonazePAM (KLONOPIN) 0.5 MG tablet Take 1.5 mg by mouth at bedtime. 11/08/16  Yes [provider]  fexofenadine (ALLEGRA) 60 MG tablet Take 60 mg by mouth 2 (two) times daily as needed for allergies or rhinitis.   Yes [provider]  lactobacillus acidophilus (BACID) TABS tablet Take 2 tablets by mouth daily.   Yes [provider]  montelukast (SINGULAIR) 10 MG tablet Take 10 mg by mouth at bedtime. 11/23/16  Yes [provider]  pantoprazole (PROTONIX) 40 MG tablet Take 40 mg by mouth 2 (two) times daily.    Yes [provider]  sertraline (ZOLOFT) 100 MG tablet Take 200 mg by mouth at bedtime. 11/08/16  Yes [provider]    Physical Exam: Vitals:   11/01/17 0316 11/01/17 0410 11/01/17 0505 11/01/17 0623  BP: 121/72 121/72 100/62 97/61  Pulse: 68 73 72 71  Resp:  16 16 15  (!) 21  Temp:      TempSrc:      SpO2: 98% 96% 98% 98%    Constitutional: Afebrile, currently reporting nausea but no further vomiting, still complaining of epigastric discomfort.  Denies chest pain, shortness of breath or any other acute complaints. Eyes: PERTLA, lids and conjunctivae normal, no icterus, no nystagmus. ENMT: Mucous membranes are moist. Posterior pharynx clear of any  exudate or lesions.  Poor dentition.  No thrush Neck: supple, no masses, no thyromegaly, no JVD. Respiratory: clear to auscultation bilaterally, no wheezing, no crackles. Normal respiratory effort. No accessory muscle use.  Cardiovascular: S1 & S2 heard, regular rate and rhythm, no murmurs / rubs / gallops. No extremity edema. 2+ pedal pulses. No carotid bruits.  Abdomen: No distention, no guarding, tenderness to palpation in his epigastric area, positive bowel sounds.  Musculoskeletal: no clubbing / cyanosis. No joint deformity upper and lower extremities. Good ROM, no contractures. Normal muscle tone.  Skin: no rashes, lesions, ulcers. No induration, no petechiae or open wounds. Neurologic: CN 2-12 grossly intact. Sensation intact, DTR normal. Strength 5/5 in all 4 limbs.  Psychiatric: Normal judgment and insight. Alert and oriented x 3. Normal mood.     Labs on Admission: I have personally reviewed following labs and imaging studies  CBC: Recent Labs  Lab 11/01/17 0048  WBC 22.2*  NEUTROABS 19.1*  HGB 13.5  HCT 41.7  MCV 86.5  PLT 228   Basic Metabolic Panel: Recent Labs  Lab 11/01/17 0048  NA 136  K 3.5  CL 102  CO2 26  GLUCOSE 136*  BUN 19  CREATININE 0.96  CALCIUM 9.0   Liver Function Tests: Recent Labs  Lab 11/01/17 0048  AST 24  ALT 17  ALKPHOS 60  BILITOT 0.5  PROT 7.1  ALBUMIN 3.8   Urine analysis:    Component Value Date/Time   COLORURINE YELLOW 11/01/2017 0506   APPEARANCEUR CLEAR 11/01/2017 0506   LABSPEC >1.046 (H) 11/01/2017 0506   PHURINE 7.0 11/01/2017 0506   GLUCOSEU NEGATIVE 11/01/2017 0506   HGBUR MODERATE (A) 11/01/2017 0506   BILIRUBINUR NEGATIVE 11/01/2017 0506   KETONESUR NEGATIVE 11/01/2017 0506   PROTEINUR NEGATIVE 11/01/2017 0506   NITRITE NEGATIVE 11/01/2017 0506   LEUKOCYTESUR NEGATIVE 11/01/2017 0506     Radiological Exams on Admission: Ct Abdomen Pelvis W Contrast  Result Date: 11/01/2017 CLINICAL DATA:  Abdominal  pain EXAM: CT ABDOMEN AND PELVIS WITH CONTRAST TECHNIQUE: Multidetector CT imaging of the abdomen and pelvis was performed using the standard protocol following bolus administration of intravenous contrast. CONTRAST:  100 mL Isovue-300 intravenous COMPARISON:  CT abdomen pelvis 02/13/2017, 03/12/2017, 01/27/2017, 12/17/2016, 12/11/2016 FINDINGS: Lower chest: Lung bases demonstrate patchy dependent atelectasis. Borderline cardiomegaly. Hepatobiliary: No focal liver abnormality is seen. Status post cholecystectomy. No biliary dilatation. Pancreas: Diffusely edematous and indistinct in appearance. Moderate surrounding fluid and inflammation consistent with acute pancreatitis small locules of gas at the uncinate process and head of the pancreas. Tiny gas within fluid inferior to the tail of the pancreas, series 2, image number 38. Spleen: Normal in size without focal abnormality. Adrenals/Urinary Tract: Adrenal glands are within normal limits. Kidneys show no hydronephrosis. The bladder is normal Stomach/Bowel: Stomach is within normal limits. Appendix appears normal. No evidence of bowel wall thickening, distention, or inflammatory changes. Vascular/Lymphatic: Mild aortic atherosclerosis. No aneurysmal dilatation. No significantly enlarged lymph nodes. Reproductive: Prostate is unremarkable. Other: Negative for free air. Musculoskeletal: No acute or significant osseous findings. IMPRESSION: 1.  Indistinct edematous appearing pancreas with moderate surrounding fluid and inflammatory changes, consistent with acute pancreatitis. No well-formed focal fluid collections are seen at this time. Grossly homogeneous pancreatic enhancement. There are however tiny gas locules at the uncinate process, pancreatic neck, and within a tiny fluid collection near the tail of the pancreas; patient was noted on previous imaging to have infected pseudocyst/abscess with similar but larger gas collections, close imaging follow-up recommended.  2. There are no other acute or significant abnormalities visualized. Electronically Signed   By: Jasmine Pang M.D.   On: 11/01/2017 03:25    EKG:  Normal axis, normal rate and breathing; poor R wave progression; no acute ischemic changes.  Assessment/Plan 1-nausea, vomiting and abdominal pain: In the setting of acute pancreatitis -Patient without history of alcohol tobacco abuse -Prior history about a year ago of gallstone pancreatitis -Status post cholecystectomy; with subsequent complication of pseudocyst which was treated at [facility]. -Patient now with elevated lipase in the 3000 range, nausea, vomiting, epigastric discomfort radiated to his back and poor appetite.  Symptoms present for the last 24 hours prior to admission. -CT scan demonstrating acute pancreatitis changes without pseudocyst or abscess formation. -Most likely secondary to transient pass of gallstone again. -We will provide aggressive fluid resuscitation, as needed antiemetics and analgesics, n.p.o. except for ice chips and sips with medicine and follow close clinical response. -Incentive spirometer has been encouraged.  2-Leukocytosis -Appears to be reactive leukocytosis in the setting of acute pancreatitis. -Patient afebrile and without source of infection at this moment -Will provide fluid resuscitation and repeat CBC in am to follow WBC's trend.  3-GERD (gastroesophageal reflux disease) -With a past medical history significant for gastric ulcer. -Patient reports no hematemesis, melena or hematochezia. -Will continue Protonix.  4-Anxiety -Currently stable. -Continue home anxiolytic regimen.  5-asthma in adult, mild intermittent, uncomplicated -Continue Dulera (hospital equivalent for Advair) and singulair  -no shortness of breath or wheezing present at this moment. -PRN albuterol has been ordered.  6-dehydration -In the setting of acute pancreatitis with decreased p.o. intake and  nausea/vomiting. -Will provide fluid resuscitation -Follow clinical response.   Time: 60 minutes.  More than 50% of the time dedicated to face-to-face examination, detailed discussion of his current condition and the plan of care; family updates and coordination of care.   DVT prophylaxis: Lovenox Code Status: Full code Family Communication: Wife at bedside Disposition Plan: Anticipate discharge back home in the next 2 days or so, once medically stable. Consults called: None Admission status: Inpatient, MedSurg, length of stay more than 2 midnights.   Vassie Loll MD [facility] Pager [phone] If 7PM-7AM, please contact night-coverage [email or web address] Password [facility]  11/01/2017, 10:37 AM

## 2017-11-01 NOTE — ED Notes (Signed)
ED TO INPATIENT HANDOFF REPORT  Name/Age/Gender [name] 62 y.o. male  Code Status    Code Status Orders  (From admission, onward)        Start     Ordered   11/01/17 1034  Full code  Continuous     11/01/17 1037    Code Status History    Date Active Date Inactive Code Status Order ID Comments User Context   01/27/2017 11:46 02/02/2017 16:16 Full Code [id number]  Jackolyn Confer, MD ED   12/11/2016 18:19 12/23/2016 16:17 Full Code [id number]  Charlynne Cousins, MD ED      Home/SNF/Other Home  Chief Complaint Abdominal Pain  Level of Care/Admitting Diagnosis ED Disposition    ED Disposition Condition [facility] Area: [facility] [[id number]]  Level of Care: Med-Surg [16]  Diagnosis: Acute pancreatitis [577.0.ICD-9-CM]  Admitting Physician: Barton Dubois [[id number]]  Attending Physician: Barton Dubois [phone]  Estimated length of stay: past midnight tomorrow  Certification:: I certify this patient will need inpatient services for at least 2 midnights  PT Class (Do Not Modify): Inpatient [101]  PT Acc Code (Do Not Modify): Private [1]       Medical History Past Medical History:  Diagnosis Date  . Anxiety   . Asthma    uses inhaler  . Pancreatitis     Allergies No Known Allergies  IV Location/Drains/Wounds Patient Lines/Drains/Airways Status   Active Line/Drains/Airways    Name:   Placement date:   Placement time:   Site:   Days:   Peripheral IV 11/01/17 Left Forearm   11/01/17    0053    Forearm   less than 1   Closed System Drain 1 Right;Lateral;Medial Abdomen Bulb (JP) 19 Fr.   12/13/16    1622    Abdomen   323   Closed System Drain 1 Left LUQ Other (Comment) 12 Fr.   01/28/17    0943    LUQ   277   Closed System Drain Left Abdomen Other (Comment) 16 Fr.   02/27/17    1011    Abdomen   247   Incision (Closed) 12/13/16 Abdomen Other (Comment)   12/13/16    1633     323   Incision - 4 Ports Abdomen Umbilicus Medial;Superior  Right;Lateral;Upper Right;Lateral;Lower   12/13/16    1533     323          Labs/Imaging Results for orders placed or performed during the hospital encounter of 11/01/17 (from the past 48 hour(s))  Lipase, blood     Status: Abnormal   Collection Time: 11/01/17 12:48 AM  Result Value Ref Range   Lipase 3,388 (H) 11 - 51 U/L    Comment: RESULTS CONFIRMED BY MANUAL DILUTION  Comprehensive metabolic panel     Status: Abnormal   Collection Time: 11/01/17 12:48 AM  Result Value Ref Range   Sodium 136 135 - 145 mmol/L   Potassium 3.5 3.5 - 5.1 mmol/L   Chloride 102 101 - 111 mmol/L   CO2 26 22 - 32 mmol/L   Glucose, Bld 136 (H) 65 - 99 mg/dL   BUN 19 6 - 20 mg/dL   Creatinine, Ser 0.96 0.61 - 1.24 mg/dL   Calcium 9.0 8.9 - 10.3 mg/dL   Total Protein 7.1 6.5 - 8.1 g/dL   Albumin 3.8 3.5 - 5.0 g/dL   AST 24 15 - 41 U/L   ALT 17 17 - 63 U/L  Alkaline Phosphatase 60 38 - 126 U/L   Total Bilirubin 0.5 0.3 - 1.2 mg/dL   GFR calc non Af Amer >60 >60 mL/min   GFR calc Af Amer >60 >60 mL/min    Comment: (NOTE) The eGFR has been calculated using the CKD EPI equation. This calculation has not been validated in all clinical situations. eGFR's persistently <60 mL/min signify possible Chronic Kidney Disease.    Anion gap 8 5 - 15  CBC     Status: Abnormal   Collection Time: 11/01/17 12:48 AM  Result Value Ref Range   WBC 22.2 (H) 4.0 - 10.5 K/uL   RBC 4.82 4.22 - 5.81 MIL/uL   Hemoglobin 13.5 13.0 - 17.0 g/dL   HCT 41.7 39.0 - 52.0 %   MCV 86.5 78.0 - 100.0 fL   MCH 28.0 26.0 - 34.0 pg   MCHC 32.4 30.0 - 36.0 g/dL   RDW 17.2 (H) 11.5 - 15.5 %   Platelets 228 150 - 400 K/uL  Differential     Status: Abnormal   Collection Time: 11/01/17 12:48 AM  Result Value Ref Range   Neutrophils Relative % 87 %   Neutro Abs 19.1 (H) 1.7 - 7.7 K/uL   Lymphocytes Relative 7 %   Lymphs Abs 1.5 0.7 - 4.0 K/uL   Monocytes Relative 5 %   Monocytes Absolute 1.2 (H) 0.1 - 1.0 K/uL   Eosinophils  Relative 1 %   Eosinophils Absolute 0.1 0.0 - 0.7 K/uL   Basophils Relative 0 %   Basophils Absolute 0.0 0.0 - 0.1 K/uL  I-Stat CG4 Lactic Acid, ED     Status: None   Collection Time: 11/01/17  4:23 AM  Result Value Ref Range   Lactic Acid, Venous 1.53 0.5 - 1.9 mmol/L  Urinalysis, Routine w reflex microscopic     Status: Abnormal   Collection Time: 11/01/17  5:06 AM  Result Value Ref Range   Color, Urine YELLOW YELLOW   APPearance CLEAR CLEAR   Specific Gravity, Urine >1.046 (H) 1.005 - 1.030   pH 7.0 5.0 - 8.0   Glucose, UA NEGATIVE NEGATIVE mg/dL   Hgb urine dipstick MODERATE (A) NEGATIVE   Bilirubin Urine NEGATIVE NEGATIVE   Ketones, ur NEGATIVE NEGATIVE mg/dL   Protein, ur NEGATIVE NEGATIVE mg/dL   Nitrite NEGATIVE NEGATIVE   Leukocytes, UA NEGATIVE NEGATIVE   RBC / HPF 0-5 0 - 5 RBC/hpf   WBC, UA NONE SEEN 0 - 5 WBC/hpf   Bacteria, UA RARE (A) NONE SEEN   Squamous Epithelial / LPF NONE SEEN NONE SEEN  CBC     Status: Abnormal   Collection Time: 11/01/17 10:34 AM  Result Value Ref Range   WBC 15.1 (H) 4.0 - 10.5 K/uL   RBC 4.13 (L) 4.22 - 5.81 MIL/uL   Hemoglobin 11.6 (L) 13.0 - 17.0 g/dL   HCT 35.7 (L) 39.0 - 52.0 %   MCV 86.4 78.0 - 100.0 fL   MCH 28.1 26.0 - 34.0 pg   MCHC 32.5 30.0 - 36.0 g/dL   RDW 17.4 (H) 11.5 - 15.5 %   Platelets 163 150 - 400 K/uL  Magnesium     Status: Abnormal   Collection Time: 11/01/17 10:34 AM  Result Value Ref Range   Magnesium 1.6 (L) 1.7 - 2.4 mg/dL  Phosphorus     Status: None   Collection Time: 11/01/17 10:34 AM  Result Value Ref Range   Phosphorus 2.9 2.5 - 4.6 mg/dL  Hemoglobin A1c  Status: Abnormal   Collection Time: 11/01/17 11:02 AM  Result Value Ref Range   Hgb A1c MFr Bld 5.9 (H) 4.8 - 5.6 %    Comment: (NOTE) Pre diabetes:          5.7%-6.4% Diabetes:              >6.4% Glycemic control for   <7.0% adults with diabetes    Mean Plasma Glucose 122.63 mg/dL    Comment: Performed at [facility] [address], [place], [place] [address]   Ct Abdomen Pelvis W Contrast  Result Date: 11/01/2017 CLINICAL DATA:  Abdominal pain EXAM: CT ABDOMEN AND PELVIS WITH CONTRAST TECHNIQUE: Multidetector CT imaging of the abdomen and pelvis was performed using the standard protocol following bolus administration of intravenous contrast. CONTRAST:  100 mL Isovue-300 intravenous COMPARISON:  CT abdomen pelvis 02/13/2017, 03/12/2017, 01/27/2017, 12/17/2016, 12/11/2016 FINDINGS: Lower chest: Lung bases demonstrate patchy dependent atelectasis. Borderline cardiomegaly. Hepatobiliary: No focal liver abnormality is seen. Status post cholecystectomy. No biliary dilatation. Pancreas: Diffusely edematous and indistinct in appearance. Moderate surrounding fluid and inflammation consistent with acute pancreatitis small locules of gas at the uncinate process and head of the pancreas. Tiny gas within fluid inferior to the tail of the pancreas, series 2, image number 38. Spleen: Normal in size without focal abnormality. Adrenals/Urinary Tract: Adrenal glands are within normal limits. Kidneys show no hydronephrosis. The bladder is normal Stomach/Bowel: Stomach is within normal limits. Appendix appears normal. No evidence of bowel wall thickening, distention, or inflammatory changes. Vascular/Lymphatic: Mild aortic atherosclerosis. No aneurysmal dilatation. No significantly enlarged lymph nodes. Reproductive: Prostate is unremarkable. Other: Negative for free air. Musculoskeletal: No acute or significant osseous findings. IMPRESSION: 1. Indistinct edematous appearing pancreas with moderate surrounding fluid and inflammatory changes, consistent with acute pancreatitis. No well-formed focal fluid collections are seen at this time. Grossly homogeneous pancreatic enhancement. There are however tiny gas locules at the uncinate process, pancreatic neck, and within a tiny fluid collection near the tail of the pancreas; patient was noted on previous  imaging to have infected pseudocyst/abscess with similar but larger gas collections, close imaging follow-up recommended. 2. There are no other acute or significant abnormalities visualized. Electronically Signed   By: Donavan Foil M.D.   On: 11/01/2017 03:25    Pending Labs Unresulted Labs (From admission, onward)   Start     Ordered   11/08/17 0500  Creatinine, serum  (enoxaparin (LOVENOX)    CrCl >/= 30 ml/min)  Weekly,   R    Comments:  while on enoxaparin therapy    11/01/17 1037   11/02/17 0500  Comprehensive metabolic panel  Tomorrow morning,   R     11/01/17 1037   11/02/17 0500  CBC  Tomorrow morning,   R     11/01/17 1037   11/02/17 0500  Lipase, blood  Tomorrow morning,   R     11/01/17 1037      Vitals/Pain Today's Vitals   11/01/17 0504 11/01/17 0505 11/01/17 0623 11/01/17 1200  BP:  100/62 97/61 126/70  Pulse:  72 71 68  Resp:  15 (!) 21 14  Temp:    98 F (36.7 C)  TempSrc:    Oral  SpO2:  98% 98% 96%  PainSc: 7        Isolation Precautions No active isolations  Medications Medications  iopamidol (ISOVUE-300) 61 % injection (not administered)  0.9 %  sodium chloride infusion ( Intravenous New Bag/Given 11/01/17 0621)  acetaminophen (TYLENOL) tablet 650 mg (650 mg Oral Given 11/01/17 1231)  mometasone-formoterol (DULERA) 200-5 MCG/ACT inhaler 2 puff (not administered)  clonazePAM (KLONOPIN) tablet 1.5 mg (not administered)  montelukast (SINGULAIR) tablet 10 mg (not administered)  pantoprazole (PROTONIX) EC tablet 40 mg (not administered)  sertraline (ZOLOFT) tablet 200 mg (not administered)  traZODone (DESYREL) tablet 50 mg (not administered)  enoxaparin (LOVENOX) injection 40 mg (not administered)  ondansetron (ZOFRAN) tablet 4 mg (not administered)    Or  ondansetron (ZOFRAN) injection 4 mg (not administered)  ketorolac (TORADOL) 30 MG/ML injection 30 mg (not administered)  HYDROcodone-acetaminophen (NORCO/VICODIN) 5-325 MG per tablet 1 tablet (not  administered)  ipratropium-albuterol (DUONEB) 0.5-2.5 (3) MG/3ML nebulizer solution 3 mL (not administered)  ondansetron (ZOFRAN) injection 4 mg (4 mg Intravenous Given 11/01/17 0111)  sodium chloride 0.9 % bolus 1,000 mL (0 mLs Intravenous Stopped 11/01/17 0504)  acetaminophen (TYLENOL) tablet 650 mg (650 mg Oral Given 11/01/17 0316)  sodium chloride 0.9 % bolus 1,000 mL (0 mLs Intravenous Stopped 11/01/17 0504)  iopamidol (ISOVUE-300) 61 % injection 100 mL (100 mLs Intravenous Contrast Given 11/01/17 0249)  fentaNYL (SUBLIMAZE) injection 50 mcg (50 mcg Intravenous Given 11/01/17 0411)  sodium chloride 0.9 % bolus 1,000 mL (0 mLs Intravenous Stopped 11/01/17 1320)    Mobility walks

## 2017-11-01 NOTE — ED Provider Notes (Signed)
[facility]-EMERGENCY DEPT Provider Note   CSN: [id number] Arrival date & time: 11/01/17  0019     History   Chief Complaint Chief Complaint  Patient presents with  . Abdominal Pain    HPI [name] is a 62 y.o. male.  HPI  62 year old male with a history of gallstone pancreatitis in March 2018 as well as a pancreatic cyst and duodenal ulcer presents with acute epigastric abdominal pain.  Started tonight around 11 PM.  He ate some apples and nuts and drink some milk and states he had a cold feeling and then has had progressive abdominal pain since.  Started having vomiting on arrival to the ED.  He had a bowel movement tonight that was normal.  The pain is around an 8 out of 10 and worsens with inspiration.  There is no shortness of breath or chest pain.  The pain is sharp.  There is no back pain.  No urinary symptoms.  He took 325 mg Tylenol prior to arrival with no relief.  Past Medical History:  Diagnosis Date  . Anxiety   . Asthma    uses inhaler  . Pancreatitis     Patient Active Problem List   Diagnosis Date Noted  . Acute pancreatitis 11/01/2017  . Pancreatic pseudocyst 01/27/2017  . Bilateral pleural effusion 12/20/2016  . Leukocytosis 12/18/2016  . Hypokalemia 12/18/2016  . Thrush 12/18/2016  . Acute gallstone pancreatitis 12/12/2016  . Hyponatremia 12/11/2016  . AKI (acute kidney injury) (HCC) 12/11/2016    Past Surgical History:  Procedure Laterality Date  . CHOLECYSTECTOMY N/A 12/13/2016   Procedure: LAPAROSCOPIC CHOLECYSTECTOMY WITH INTRAOPERATIVE CHOLANGIOGRAM;  Surgeon: Chevis Pretty III, MD;  Location: [facility] ORS;  Service: General;  Laterality: N/A;  . GUM SURGERY    . IR CATHETER TUBE CHANGE  02/27/2017  . IR RADIOLOGIST EVAL & MGMT  02/13/2017  . IR RADIOLOGIST EVAL & MGMT  03/12/2017       Home Medications    Prior to Admission medications   Medication Sig Start Date End Date Taking? Authorizing Provider  acetaminophen (TYLENOL)  325 MG tablet Take 650 mg by mouth every 6 (six) hours as needed for mild pain.   Yes [provider]  ADVAIR DISKUS 250-50 MCG/DOSE AEPB Inhale 1 puff into the lungs 2 (two) times daily. 11/06/16  Yes [provider]  clonazePAM (KLONOPIN) 0.5 MG tablet Take 1.5 mg by mouth at bedtime. 11/08/16  Yes [provider]  fexofenadine (ALLEGRA) 60 MG tablet Take 60 mg by mouth 2 (two) times daily as needed for allergies or rhinitis.   Yes [provider]  lactobacillus acidophilus (BACID) TABS tablet Take 2 tablets by mouth daily.   Yes [provider]  montelukast (SINGULAIR) 10 MG tablet Take 10 mg by mouth at bedtime. 11/23/16  Yes [provider]  pantoprazole (PROTONIX) 40 MG tablet Take 40 mg by mouth 2 (two) times daily.    Yes [provider]  sertraline (ZOLOFT) 100 MG tablet Take 200 mg by mouth at bedtime. 11/08/16  Yes [provider]  ciprofloxacin (CIPRO) 500 MG tablet Take 1 tablet (500 mg total) by mouth 2 (two) times daily. Patient not taking: Reported on 11/01/2017 02/02/17   Glenna Fellows, MD  metroNIDAZOLE (FLAGYL) 500 MG tablet Take 1 tablet (500 mg total) by mouth 3 (three) times daily. Patient not taking: Reported on 03/12/2017 02/02/17   Glenna Fellows, MD    Family History Family History  Problem  Relation Age of Onset  . Dementia Mother     Social History Social History   Tobacco Use  . Smoking status: Never Smoker  . Smokeless tobacco: Never Used  Substance Use Topics  . Alcohol use: Yes    Alcohol/week: 0.6 oz    Types: 1 Glasses of wine per week  . Drug use: No     Allergies   Patient has no known allergies.   Review of Systems Review of Systems  Constitutional: Negative for fever.  Respiratory: Negative for shortness of breath.   Cardiovascular: Negative for chest pain.  Gastrointestinal: Positive for abdominal pain, nausea and vomiting. Negative for blood in stool, constipation  and diarrhea.  Genitourinary: Negative for dysuria.  Musculoskeletal: Negative for back pain.  All other systems reviewed and are negative.    Physical Exam Updated Vital Signs BP 121/72 (BP Location: Right Arm)   Pulse 73   Temp (!) 97.5 F (36.4 C) (Oral)   Resp 16   SpO2 96%   Physical Exam  Constitutional: He is oriented to person, place, and time. He appears well-developed and well-nourished.  Non-toxic appearance. He does not appear ill. No distress.  HENT:  Head: Normocephalic and atraumatic.  Right Ear: External ear normal.  Left Ear: External ear normal.  Nose: Nose normal.  Eyes: Right eye exhibits no discharge. Left eye exhibits no discharge.  Neck: Neck supple.  Cardiovascular: Normal rate, regular rhythm and normal heart sounds.  Pulmonary/Chest: Effort normal and breath sounds normal.  Abdominal: Soft. There is tenderness in the epigastric area. There is guarding. There is no rigidity.  Musculoskeletal: He exhibits no edema.  Neurological: He is alert and oriented to person, place, and time.  Skin: Skin is warm and dry.  Nursing note and vitals reviewed.    ED Treatments / Results  Labs (all labs ordered are listed, but only abnormal results are displayed) Labs Reviewed  LIPASE, BLOOD - Abnormal; Notable for the following components:      Result Value   Lipase 3,388 (*)    All other components within normal limits  COMPREHENSIVE METABOLIC PANEL - Abnormal; Notable for the following components:   Glucose, Bld 136 (*)    All other components within normal limits  CBC - Abnormal; Notable for the following components:   WBC 22.2 (*)    RDW 17.2 (*)    All other components within normal limits  DIFFERENTIAL - Abnormal; Notable for the following components:   Neutro Abs 19.1 (*)    Monocytes Absolute 1.2 (*)    All other components within normal limits  URINALYSIS, ROUTINE W REFLEX MICROSCOPIC  I-STAT CG4 LACTIC ACID, ED    EKG  EKG  Interpretation  Date/Time:  Friday November 01 2017 00:32:46 EST Ventricular Rate:  63 PR Interval:    QRS Duration: 110 QT Interval:  427 QTC Calculation: 438 R Axis:   83 Text Interpretation:  Sinus rhythm Borderline right axis deviation flat T waves  Confirmed by Pricilla LovelessGoldston, Ndia Sampath [phone]([id number]) on 11/01/2017 1:37:14 AM       Radiology Ct Abdomen Pelvis W Contrast  Result Date: 11/01/2017 CLINICAL DATA:  Abdominal pain EXAM: CT ABDOMEN AND PELVIS WITH CONTRAST TECHNIQUE: Multidetector CT imaging of the abdomen and pelvis was performed using the standard protocol following bolus administration of intravenous contrast. CONTRAST:  100 mL Isovue-300 intravenous COMPARISON:  CT abdomen pelvis 02/13/2017, 03/12/2017, 01/27/2017, 12/17/2016, 12/11/2016 FINDINGS: Lower chest: Lung bases demonstrate patchy dependent atelectasis. Borderline cardiomegaly. Hepatobiliary: No focal  liver abnormality is seen. Status post cholecystectomy. No biliary dilatation. Pancreas: Diffusely edematous and indistinct in appearance. Moderate surrounding fluid and inflammation consistent with acute pancreatitis small locules of gas at the uncinate process and head of the pancreas. Tiny gas within fluid inferior to the tail of the pancreas, series 2, image number 38. Spleen: Normal in size without focal abnormality. Adrenals/Urinary Tract: Adrenal glands are within normal limits. Kidneys show no hydronephrosis. The bladder is normal Stomach/Bowel: Stomach is within normal limits. Appendix appears normal. No evidence of bowel wall thickening, distention, or inflammatory changes. Vascular/Lymphatic: Mild aortic atherosclerosis. No aneurysmal dilatation. No significantly enlarged lymph nodes. Reproductive: Prostate is unremarkable. Other: Negative for free air. Musculoskeletal: No acute or significant osseous findings. IMPRESSION: 1. Indistinct edematous appearing pancreas with moderate surrounding fluid and inflammatory changes, consistent  with acute pancreatitis. No well-formed focal fluid collections are seen at this time. Grossly homogeneous pancreatic enhancement. There are however tiny gas locules at the uncinate process, pancreatic neck, and within a tiny fluid collection near the tail of the pancreas; patient was noted on previous imaging to have infected pseudocyst/abscess with similar but larger gas collections, close imaging follow-up recommended. 2. There are no other acute or significant abnormalities visualized. Electronically Signed   By: Jasmine Pang M.D.   On: 11/01/2017 03:25    Procedures Procedures (including critical care time)  Medications Ordered in ED Medications  iopamidol (ISOVUE-300) 61 % injection (not administered)  0.9 %  sodium chloride infusion ( Intravenous Hold 11/01/17 0408)  sodium chloride 0.9 % bolus 1,000 mL (0 mLs Intravenous Hold 11/01/17 0408)  ondansetron (ZOFRAN) injection 4 mg (4 mg Intravenous Given 11/01/17 0111)  sodium chloride 0.9 % bolus 1,000 mL (1,000 mLs Intravenous New Bag/Given 11/01/17 0316)  acetaminophen (TYLENOL) tablet 650 mg (650 mg Oral Given 11/01/17 0316)  sodium chloride 0.9 % bolus 1,000 mL (1,000 mLs Intravenous New Bag/Given 11/01/17 0316)  iopamidol (ISOVUE-300) 61 % injection 100 mL (100 mLs Intravenous Contrast Given 11/01/17 0249)  fentaNYL (SUBLIMAZE) injection 50 mcg (50 mcg Intravenous Given 11/01/17 0411)     Initial Impression / Assessment and Plan / ED Course  I have reviewed the triage vital signs and the nursing notes.  Pertinent labs & imaging results that were available during my care of the patient were reviewed by me and considered in my medical decision making (see chart for details).  Clinical Course as of Nov 01 440  Fri Nov 01, 2017  8295 Dr Toniann Fail will admit. Asks for 3rd liter and 150/hr normal saline.  [SG]    Clinical Course User Index [SG] Pricilla Loveless, MD    Patient has recurrent acute pancreatitis.  CT scan obtained and  shows no evidence of pseudocyst or abscess.  He did have a prior abscess in the past and due to complications from this is why he was originally referred to [facility].  However at this time I think this appears to be a more straightforward pancreatitis and he does not need emergent transfer.  He will be given IV fluids.  He was given 2 L and Dr. Toniann Fail asked for a third and maintenance at 150 mL/hour.  The patient originally only wanted Tylenol for pain but then now is requesting something stronger and will be given IV fentanyl.  N.p.o. otherwise.  Admit to the hospitalist service.  Final Clinical Impressions(s) / ED Diagnoses   Final diagnoses:  Acute pancreatitis without infection or necrosis, unspecified pancreatitis type    ED Discharge Orders  None       Pricilla Loveless, MD 11/01/17 [phone]

## 2017-11-01 NOTE — ED Triage Notes (Addendum)
Wife states pt is c/o abd pain for the past two hours   Pt has active vomiting in triage  Pt is ashen in color  Skin warm and dry upon arrival but pt diaphoretic after episode of vomiting in triage    Pt was seen a year ago for acute gall bladder and pancreatitis and has been under the care of [facility] since

## 2017-11-02 DIAGNOSIS — K219 Gastro-esophageal reflux disease without esophagitis: Secondary | ICD-10-CM

## 2017-11-02 DIAGNOSIS — F419 Anxiety disorder, unspecified: Secondary | ICD-10-CM

## 2017-11-02 DIAGNOSIS — K859 Acute pancreatitis without necrosis or infection, unspecified: Principal | ICD-10-CM

## 2017-11-02 DIAGNOSIS — D72829 Elevated white blood cell count, unspecified: Secondary | ICD-10-CM

## 2017-11-02 DIAGNOSIS — J4520 Mild intermittent asthma, uncomplicated: Secondary | ICD-10-CM

## 2017-11-02 LAB — COMPREHENSIVE METABOLIC PANEL
ALT: 15 U/L — ABNORMAL LOW (ref 17–63)
AST: 16 U/L (ref 15–41)
Albumin: 3.2 g/dL — ABNORMAL LOW (ref 3.5–5.0)
Alkaline Phosphatase: 34 U/L — ABNORMAL LOW (ref 38–126)
Anion gap: 3 — ABNORMAL LOW (ref 5–15)
BUN: 9 mg/dL (ref 6–20)
CHLORIDE: 107 mmol/L (ref 101–111)
CO2: 25 mmol/L (ref 22–32)
Calcium: 8.2 mg/dL — ABNORMAL LOW (ref 8.9–10.3)
Creatinine, Ser: 0.7 mg/dL (ref 0.61–1.24)
GFR calc Af Amer: >60 mL/min (ref >60)
Glucose, Bld: 97 mg/dL (ref 65–99)
POTASSIUM: 3.3 mmol/L — AB (ref 3.5–5.1)
SODIUM: 135 mmol/L (ref 135–145)
Total Bilirubin: 0.8 mg/dL (ref 0.3–1.2)
Total Protein: 5.7 g/dL — ABNORMAL LOW (ref 6.5–8.1)

## 2017-11-02 LAB — CBC
HCT: 34.7 % — ABNORMAL LOW (ref 39.0–52.0)
Hemoglobin: 11.2 g/dL — ABNORMAL LOW (ref 13.0–17.0)
MCH: 28 pg (ref 26.0–34.0)
MCHC: 32.3 g/dL (ref 30.0–36.0)
MCV: 86.8 fL (ref 78.0–100.0)
Platelets: 145 10*3/uL — ABNORMAL LOW (ref 150–400)
RBC: 4 MIL/uL — AB (ref 4.22–5.81)
RDW: 17.8 % — ABNORMAL HIGH (ref 11.5–15.5)
WBC: 7.3 10*3/uL (ref 4.0–10.5)

## 2017-11-02 LAB — MAGNESIUM: Magnesium: 1.9 mg/dL (ref 1.7–2.4)

## 2017-11-02 LAB — LIPASE, BLOOD: Lipase: 510 U/L — ABNORMAL HIGH (ref 11–51)

## 2017-11-02 MED ORDER — IPRATROPIUM-ALBUTEROL 0.5-2.5 (3) MG/3ML IN SOLN: 3.0000 mL | RESPIRATORY_TRACT | Status: DC | PRN

## 2017-11-02 MED ORDER — MAGNESIUM SULFATE 2 GM/50ML IV SOLN
2.0000 g | Freq: Once | INTRAVENOUS | Status: AC
  Administered 2017-11-02: 2 g via INTRAVENOUS
  Filled 2017-11-02: qty 50

## 2017-11-02 MED ORDER — POTASSIUM CHLORIDE CRYS ER 20 MEQ PO TBCR: 40.0000 meq | EXTENDED_RELEASE_TABLET | Freq: Once | ORAL | Status: DC

## 2017-11-02 MED ORDER — KETOROLAC TROMETHAMINE 30 MG/ML IJ SOLN: 30.0000 mg | Freq: Four times a day (QID) | INTRAMUSCULAR | Status: DC | PRN

## 2017-11-02 MED ORDER — POTASSIUM CHLORIDE CRYS ER 20 MEQ PO TBCR
40.0000 meq | EXTENDED_RELEASE_TABLET | Freq: Once | ORAL | Status: AC
  Administered 2017-11-02: 40 meq via ORAL
  Filled 2017-11-02: qty 2

## 2017-11-02 NOTE — Progress Notes (Signed)
PROGRESS NOTE    [name]  [name] DOB: Apr 17, 1956 DOA: 11/01/2017 PCP: Tally Joe, MD   Brief Narrative:  [name] is a 62 year old male with a past medical history significant for anxiety, asthma, gastroesophageal reflux disease, anxiety and prior history of gallstone pancreatitis with pseudocyst complication in 2018: Presented to the ED with nausea, vomiting, epigastric abdominal pain.  Patient pain not obtain at it worse, dull and constant in nature, radiated to his back and associated with nausea and vomiting.  Patient reported worsening with food intake and nothing really makes it better.  Based on location and prior history he felt that it was associated with his pancreas again. Patient denies hematemesis, chest pain, palpitation, headaches, dysuria, hematuria, melena, hematochezia or any other complaints.  ED Course: Lipase elevated in the 3000 range, patient CT scan demonstrating acute pancreatitis; UA with increase specific gravity suggesting dehydration And elevated WBCs..  IV fluid resuscitation initiated, IV analgesics and antiemetics provided.  [facility] hospitalist consulted to admit the patient for further evaluation and treatment     Assessment & Plan:   Principal Problem:   Acute pancreatitis Active Problems:   Leukocytosis   GERD (gastroesophageal reflux disease)   Anxiety   Asthma in adult, mild intermittent, uncomplicated  #1 acute pancreatitis Patient presented with nausea vomiting abdominal pain.  CT abdomen and pelvis consistent with an acute pancreatitis changes without pseudocyst or abscess formation.  Patient noted to have a lipase of approximately 3300 on admission with nausea vomiting epigastric abdominal pain radiating to his back with decreased appetite.  Patient with a history of cholecystectomy with subsequent complication of pseudocyst treated at [facility]. He may have had transient gallstone passage. Clinical improvement.  Patient  with no further nausea or emesis.  Abdominal pain has improved.  Lipase levels trending down and currently at 510 from 3388.  LFTs within normal limits.  Will place on clear liquid diet.  IV fluids.  Pain management.  Supportive care.  2.  Leukocytosis Likely secondary to problem #1.  Resolved.  3.  Gastroesophageal reflux disease PPI.  4.  Anxiety Stable.  Klonopin nightly.  5.  Asthma Currently stable.  Continue dulera and Singulair.  Neb treatments as needed.  6.  Dehydration IV fluids.   DVT prophylaxis: Lovenox Code Status: Full Family Communication: Updated patient and friends at bedside. Disposition Plan: Likely home once acute pancreatitis has resolved, tolerating oral intake with no further nausea vomiting no abdominal pain.  Hopefully in the next 48 hours.   Consultants:   None  Procedures:   CT abdomen and pelvis 11/01/2017    Antimicrobials:   None   Subjective: Patient laying in bed.  Patient denies any further nausea or emesis.  States abdominal pain has improved.  Feels much better than on admission.  Objective: Vitals:   11/01/17 1354 11/01/17 2135 11/02/17 0606 11/02/17 0951  BP: 112/64 122/72 118/65   Pulse: 63 65 66   Resp: 15 16 18    Temp: 98.4 F (36.9 C) 98.7 F (37.1 C) 98.7 F (37.1 C)   TempSrc: Oral Oral Oral   SpO2: 97% 98% 96% 96%    Intake/Output Summary (Last 24 hours) at 11/02/2017 1317 Last data filed at 11/02/2017 1000 Gross per 24 hour  Intake 3547.5 ml  Output -  Net 3547.5 ml   There were no vitals filed for this visit.  Examination:  General exam: Appears calm and comfortable  Respiratory system: Clear to auscultation. Respiratory effort normal. Cardiovascular  system: S1 & S2 heard, RRR. No JVD, murmurs, rubs, gallops or clicks. No pedal edema. Gastrointestinal system: Abdomen is nondistended, soft and discomfort to deep palpation in the epigastric region.  No organomegaly or masses felt. Normal bowel sounds  heard. Central nervous system: Alert and oriented. No focal neurological deficits. Extremities: Symmetric 5 x 5 power. Skin: No rashes, lesions or ulcers Psychiatry: Judgement and insight appear normal. Mood & affect appropriate.     Data Reviewed: I have personally reviewed following labs and imaging studies  CBC: Recent Labs  Lab 11/01/17 0048 11/01/17 1034 11/02/17 0412  WBC 22.2* 15.1* 7.3  NEUTROABS 19.1*  --   --   HGB 13.5 11.6* 11.2*  HCT 41.7 35.7* 34.7*  MCV 86.5 86.4 86.8  PLT 228 163 145*   Basic Metabolic Panel: Recent Labs  Lab 11/01/17 0048 11/01/17 1034 11/02/17 0412  NA 136  --  135  K 3.5  --  3.3*  CL 102  --  107  CO2 26  --  25  GLUCOSE 136*  --  97  BUN 19  --  9  CREATININE 0.96  --  0.70  CALCIUM 9.0  --  8.2*  MG  --  1.6* 1.9  PHOS  --  2.9  --    GFR: CrCl cannot be calculated (Unknown ideal weight.). Liver Function Tests: Recent Labs  Lab 11/01/17 0048 11/02/17 0412  AST 24 16  ALT 17 15*  ALKPHOS 60 34*  BILITOT 0.5 0.8  PROT 7.1 5.7*  ALBUMIN 3.8 3.2*   Recent Labs  Lab 11/01/17 0048 11/02/17 0412  LIPASE 3,388* 510*   No results for input(s): AMMONIA in the last 168 hours. Coagulation Profile: No results for input(s): INR, PROTIME in the last 168 hours. Cardiac Enzymes: No results for input(s): CKTOTAL, CKMB, CKMBINDEX, TROPONINI in the last 168 hours. BNP (last 3 results) No results for input(s): PROBNP in the last 8760 hours. HbA1C: Recent Labs    11/01/17 1102  HGBA1C 5.9*   CBG: No results for input(s): GLUCAP in the last 168 hours. Lipid Profile: No results for input(s): CHOL, HDL, LDLCALC, TRIG, CHOLHDL, LDLDIRECT in the last 72 hours. Thyroid Function Tests: No results for input(s): TSH, T4TOTAL, FREET4, T3FREE, THYROIDAB in the last 72 hours. Anemia Panel: No results for input(s): VITAMINB12, FOLATE, FERRITIN, TIBC, IRON, RETICCTPCT in the last 72 hours. Sepsis Labs: Recent Labs  Lab  11/01/17 0423  LATICACIDVEN 1.53    No results found for this or any previous visit (from the past 240 hour(s)).       Radiology Studies: Ct Abdomen Pelvis W Contrast  Result Date: 11/01/2017 CLINICAL DATA:  Abdominal pain EXAM: CT ABDOMEN AND PELVIS WITH CONTRAST TECHNIQUE: Multidetector CT imaging of the abdomen and pelvis was performed using the standard protocol following bolus administration of intravenous contrast. CONTRAST:  100 mL Isovue-300 intravenous COMPARISON:  CT abdomen pelvis 02/13/2017, 03/12/2017, 01/27/2017, 12/17/2016, 12/11/2016 FINDINGS: Lower chest: Lung bases demonstrate patchy dependent atelectasis. Borderline cardiomegaly. Hepatobiliary: No focal liver abnormality is seen. Status post cholecystectomy. No biliary dilatation. Pancreas: Diffusely edematous and indistinct in appearance. Moderate surrounding fluid and inflammation consistent with acute pancreatitis small locules of gas at the uncinate process and head of the pancreas. Tiny gas within fluid inferior to the tail of the pancreas, series 2, image number 38. Spleen: Normal in size without focal abnormality. Adrenals/Urinary Tract: Adrenal glands are within normal limits. Kidneys show no hydronephrosis. The bladder is normal Stomach/Bowel: Stomach is  within normal limits. Appendix appears normal. No evidence of bowel wall thickening, distention, or inflammatory changes. Vascular/Lymphatic: Mild aortic atherosclerosis. No aneurysmal dilatation. No significantly enlarged lymph nodes. Reproductive: Prostate is unremarkable. Other: Negative for free air. Musculoskeletal: No acute or significant osseous findings. IMPRESSION: 1. Indistinct edematous appearing pancreas with moderate surrounding fluid and inflammatory changes, consistent with acute pancreatitis. No well-formed focal fluid collections are seen at this time. Grossly homogeneous pancreatic enhancement. There are however tiny gas locules at the uncinate process,  pancreatic neck, and within a tiny fluid collection near the tail of the pancreas; patient was noted on previous imaging to have infected pseudocyst/abscess with similar but larger gas collections, close imaging follow-up recommended. 2. There are no other acute or significant abnormalities visualized. Electronically Signed   By: Jasmine Pang M.D.   On: 11/01/2017 03:25        Scheduled Meds: . clonazePAM  1.5 mg Oral QHS  . enoxaparin (LOVENOX) injection  40 mg Subcutaneous Q24H  . mometasone-formoterol  2 puff Inhalation BID  . montelukast  10 mg Oral QHS  . pantoprazole  40 mg Oral BID  . sertraline  200 mg Oral QHS   Continuous Infusions: . sodium chloride 150 mL/hr at 11/02/17 1213  . magnesium sulfate 1 - 4 g bolus IVPB       LOS: 1 day    Time spent: 35 minutes    Ramiro Harvest, MD [facility] Pager [phone]  If 7PM-7AM, please contact night-coverage [email or web address] Password [email or web address] 11/02/2017, 1:17 PM

## 2017-11-03 LAB — LIPASE, BLOOD: Lipase: 71 U/L — ABNORMAL HIGH (ref 11–51)

## 2017-11-03 LAB — CBC
HEMATOCRIT: 36.7 % — AB (ref 39.0–52.0)
HEMOGLOBIN: 11.8 g/dL — AB (ref 13.0–17.0)
MCH: 28 pg (ref 26.0–34.0)
MCHC: 32.2 g/dL (ref 30.0–36.0)
MCV: 87 fL (ref 78.0–100.0)
Platelets: 160 10*3/uL (ref 150–400)
RBC: 4.22 MIL/uL (ref 4.22–5.81)
RDW: 17.8 % — ABNORMAL HIGH (ref 11.5–15.5)
WBC: 6.2 10*3/uL (ref 4.0–10.5)

## 2017-11-03 LAB — COMPREHENSIVE METABOLIC PANEL
ALBUMIN: 3.5 g/dL (ref 3.5–5.0)
ALT: 15 U/L — ABNORMAL LOW (ref 17–63)
ANION GAP: 6 (ref 5–15)
AST: 18 U/L (ref 15–41)
Alkaline Phosphatase: 34 U/L — ABNORMAL LOW (ref 38–126)
BUN: 8 mg/dL (ref 6–20)
CO2: 24 mmol/L (ref 22–32)
Calcium: 8.7 mg/dL — ABNORMAL LOW (ref 8.9–10.3)
Chloride: 110 mmol/L (ref 101–111)
Creatinine, Ser: 0.76 mg/dL (ref 0.61–1.24)
GFR calc Af Amer: >60 mL/min (ref >60)
GFR calc non Af Amer: >60 mL/min (ref >60)
Glucose, Bld: 93 mg/dL (ref 65–99)
POTASSIUM: 3.6 mmol/L (ref 3.5–5.1)
SODIUM: 140 mmol/L (ref 135–145)
TOTAL PROTEIN: 6.5 g/dL (ref 6.5–8.1)
Total Bilirubin: 0.8 mg/dL (ref 0.3–1.2)

## 2017-11-03 LAB — MAGNESIUM: Magnesium: 2 mg/dL (ref 1.7–2.4)

## 2017-11-03 MED ORDER — POTASSIUM CHLORIDE CRYS ER 20 MEQ PO TBCR
40.0000 meq | EXTENDED_RELEASE_TABLET | Freq: Once | ORAL | Status: AC
  Administered 2017-11-03: 40 meq via ORAL
  Filled 2017-11-03: qty 2

## 2017-11-03 NOTE — Progress Notes (Signed)
Nurse reviewed discharge instructions with pt.  Pt verbalized understanding of discharge instructions and follow up appointments.  No concerns at time of discharge.   

## 2017-11-03 NOTE — Discharge Summary (Signed)
Physician Discharge Summary  [name] WUJ:[id number] DOB: 01/31/1956 DOA: 11/01/2017  PCP: [name] JoeMerlinda FrederickSwayne, Furkan, MD  Admit date: 11/01/2017 Discharge date: 11/03/2017  Time spent: 60 minutes  Recommendations for Outpatient Follow-up:  1. Follow-up with [name] JoeSwayne, Aaryan, MD in 2 weeks.  On follow-up patient will need a basic metabolic profile done to follow-up on electrolytes and renal function. 2. Follow-up with his gastroenterologist as previously scheduled.   Discharge Diagnoses:  Principal Problem:   Acute pancreatitis Active Problems:   Leukocytosis   GERD (gastroesophageal reflux disease)   Anxiety   Asthma in adult, mild intermittent, uncomplicated   Discharge Condition: Stable and improved  Diet recommendation: Soft diet/regular diet.  There were no vitals filed for this visit.  History of present illness:  Per Dr Terrance MassMadera  [name] is a 62 year old male with a past medical history significant for anxiety, asthma, gastroesophageal reflux disease, anxiety and prior history of gallstone pancreatitis with pseudocyst complication in 2018: Presented to the ED with nausea, vomiting, epigastric abdominal pain.  Patient pain not at it worst, dull and constant in nature, radiated to his back and associated with nausea and vomiting.  Patient reported worsening with food intake and nothing really made it better.  Based on location and prior history he felt that it was associated with his pancreas again. Patient denied hematemesis, chest pain, palpitation, headaches, dysuria, hematuria, melena, hematochezia or any other complaints.  ED Course: Lipase elevated in the 3000 range, patient CT scan demonstrating acute pancreatitis; UA with increase specific gravity suggesting dehydration And elevated WBCs..  IV fluid resuscitation initiated, IV analgesics and antiemetics provided.  [facility] hospitalist consulted to admit the patient for further evaluation and treatment.    Hospital Course:  #1  acute pancreatitis Patient presented with nausea vomiting abdominal pain.  CT abdomen and pelvis consistent with an acute pancreatitis changes without pseudocyst or abscess formation.  Patient noted to have a lipase of approximately 3300 on admission with nausea vomiting epigastric abdominal pain radiating to his back with decreased appetite.  Patient with a history of cholecystectomy with subsequent complication of pseudocyst [facility]. He may have had transient gallstone passage. Was admitted and aggressively hydrated placed on bowel rest, antiemetics, pain management and supportive care.  Patient improved clinically did not have any further nausea or emesis.  Abdominal pain improved significantly and lipase levels trended down significantly.  Lipase levels trended down from 3388 on admission to 71 by day of discharge.  Patient was started on a clear liquid diet which he tolerated and diet advanced to a soft diet which he tolerated.  Patient did not have any further abdominal pain, no nausea or vomiting and will be discharged in stable and improved condition.  Patient is to follow-up with PCP 2 weeks post discharge.  Patient is also to keep his appointment to follow-up with his gastroenterologist as previously scheduled.   2.  Leukocytosis Likely secondary to problem #1.  Improved with hydration and had resolved by day of discharge.  Patient had no other signs or symptoms of infection.  3.  Gastroesophageal reflux disease Remained stable.  Maintained on home regimen of PPI.  4.  Anxiety Stable.    Maintained on home regimen of Klonopin.    5.  Asthma Remained stable throughout the hospitalization.  Patient was maintained on dulera and Singulair.  Neb treatments as needed.  6.  Dehydration Hydrated with IV fluids and was euvolemic by day of discharge.  Procedures:  CT abdomen and pelvis 11/01/2017      Consultations:  None  Discharge Exam: Vitals:    11/03/17 0525 11/03/17 0854  BP: 115/71   Pulse: (!) 55   Resp: 18   Temp: 97.7 F (36.5 C)   SpO2: 96% 95%    General: NAD Cardiovascular: RRR Respiratory: CTAB GI: Abdomen is soft, nontender, nondistended, positive bowel sounds.  No hepatosplenomegaly.  Discharge Instructions   Discharge Instructions    Diet general   Complete by:  As directed    Increase activity slowly   Complete by:  As directed      Allergies as of 11/03/2017   No Known Allergies     Medication List    TAKE these medications   acetaminophen 325 MG tablet Commonly known as:  TYLENOL Take 650 mg by mouth every 6 (six) hours as needed for mild pain.   ADVAIR DISKUS 250-50 MCG/DOSE Aepb Generic drug:  Fluticasone-Salmeterol Inhale 1 puff into the lungs 2 (two) times daily.   clonazePAM 0.5 MG tablet Commonly known as:  KLONOPIN Take 1.5 mg by mouth at bedtime.   fexofenadine 60 MG tablet Commonly known as:  ALLEGRA Take 60 mg by mouth 2 (two) times daily as needed for allergies or rhinitis.   lactobacillus acidophilus Tabs tablet Take 2 tablets by mouth daily.   montelukast 10 MG tablet Commonly known as:  SINGULAIR Take 10 mg by mouth at bedtime.   pantoprazole 40 MG tablet Commonly known as:  PROTONIX Take 40 mg by mouth 2 (two) times daily.   sertraline 100 MG tablet Commonly known as:  ZOLOFT Take 200 mg by mouth at bedtime.      No Known Allergies Follow-up Information    [name] Joe, MD. Schedule an appointment as soon as possible for a visit in 2 week(s).   Specialty:  Family Medicine Contact information: [phone] [place] [address] [phone]        Gastroenterologist Follow up.   Why:  Follow-up as previously scheduled.           The results of significant diagnostics from this hospitalization (including imaging, microbiology, ancillary and laboratory) are listed below for reference.    Significant Diagnostic Studies: Ct  Abdomen Pelvis W Contrast  Result Date: 11/01/2017 CLINICAL DATA:  Abdominal pain EXAM: CT ABDOMEN AND PELVIS WITH CONTRAST TECHNIQUE: Multidetector CT imaging of the abdomen and pelvis was performed using the standard protocol following bolus administration of intravenous contrast. CONTRAST:  100 mL Isovue-300 intravenous COMPARISON:  CT abdomen pelvis 02/13/2017, 03/12/2017, 01/27/2017, 12/17/2016, 12/11/2016 FINDINGS: Lower chest: Lung bases demonstrate patchy dependent atelectasis. Borderline cardiomegaly. Hepatobiliary: No focal liver abnormality is seen. Status post cholecystectomy. No biliary dilatation. Pancreas: Diffusely edematous and indistinct in appearance. Moderate surrounding fluid and inflammation consistent with acute pancreatitis small locules of gas at the uncinate process and head of the pancreas. Tiny gas within fluid inferior to the tail of the pancreas, series 2, image number 38. Spleen: Normal in size without focal abnormality. Adrenals/Urinary Tract: Adrenal glands are within normal limits. Kidneys show no hydronephrosis. The bladder is normal Stomach/Bowel: Stomach is within normal limits. Appendix appears normal. No evidence of bowel wall thickening, distention, or inflammatory changes. Vascular/Lymphatic: Mild aortic atherosclerosis. No aneurysmal dilatation. No significantly enlarged lymph nodes. Reproductive: Prostate is unremarkable. Other: Negative for free air. Musculoskeletal: No acute or significant osseous findings. IMPRESSION: 1. Indistinct edematous appearing pancreas with moderate surrounding fluid and inflammatory changes, consistent with  acute pancreatitis. No well-formed focal fluid collections are seen at this time. Grossly homogeneous pancreatic enhancement. There are however tiny gas locules at the uncinate process, pancreatic neck, and within a tiny fluid collection near the tail of the pancreas; patient was noted on previous imaging to have infected pseudocyst/abscess  with similar but larger gas collections, close imaging follow-up recommended. 2. There are no other acute or significant abnormalities visualized. Electronically Signed   By: Jasmine Pang M.D.   On: 11/01/2017 03:25    Microbiology: No results found for this or any previous visit (from the past 240 hour(s)).   Labs: Basic Metabolic Panel: Recent Labs  Lab 11/01/17 0048 11/01/17 1034 11/02/17 0412 11/03/17 0437  NA 136  --  135 140  K 3.5  --  3.3* 3.6  CL 102  --  107 110  CO2 26  --  25 24  GLUCOSE 136*  --  97 93  BUN 19  --  9 8  CREATININE 0.96  --  0.70 0.76  CALCIUM 9.0  --  8.2* 8.7*  MG  --  1.6* 1.9 2.0  PHOS  --  2.9  --   --    Liver Function Tests: Recent Labs  Lab 11/01/17 0048 11/02/17 0412 11/03/17 0437  AST 24 16 18   ALT 17 15* 15*  ALKPHOS 60 34* 34*  BILITOT 0.5 0.8 0.8  PROT 7.1 5.7* 6.5  ALBUMIN 3.8 3.2* 3.5   Recent Labs  Lab 11/01/17 0048 11/02/17 0412 11/03/17 0437  LIPASE 3,388* 510* 71*   No results for input(s): AMMONIA in the last 168 hours. CBC: Recent Labs  Lab 11/01/17 0048 11/01/17 1034 11/02/17 0412 11/03/17 0437  WBC 22.2* 15.1* 7.3 6.2  NEUTROABS 19.1*  --   --   --   HGB 13.5 11.6* 11.2* 11.8*  HCT 41.7 35.7* 34.7* 36.7*  MCV 86.5 86.4 86.8 87.0  PLT 228 163 145* 160   Cardiac Enzymes: No results for input(s): CKTOTAL, CKMB, CKMBINDEX, TROPONINI in the last 168 hours. BNP: BNP (last 3 results) No results for input(s): BNP in the last 8760 hours.  ProBNP (last 3 results) No results for input(s): PROBNP in the last 8760 hours.  CBG: No results for input(s): GLUCAP in the last 168 hours.     Signed:  Ramiro Harvest MD.  [facility] 11/03/2017, 12:10 PM

## 2018-05-15 IMAGING — CT CT ABD-PELV W/O CM
2 of 4 series · 15 of 46 positions shown, 17 images · non-contrast
Comparison: None.

CLINICAL DATA: Abdominal pain since yesterday.

EXAM:
CT ABDOMEN AND PELVIS WITHOUT CONTRAST
TECHNIQUE: Multidetector CT imaging of the abdomen and pelvis was performed
following the standard protocol without IV contrast.

[Series 2: abd/pel w/o · axial · non-contrast · 0.85mm/px · z∈[+1151,+1646]mm · 12 of 111 slices shown, 14 images]
[im 6/111  soft-tissue]
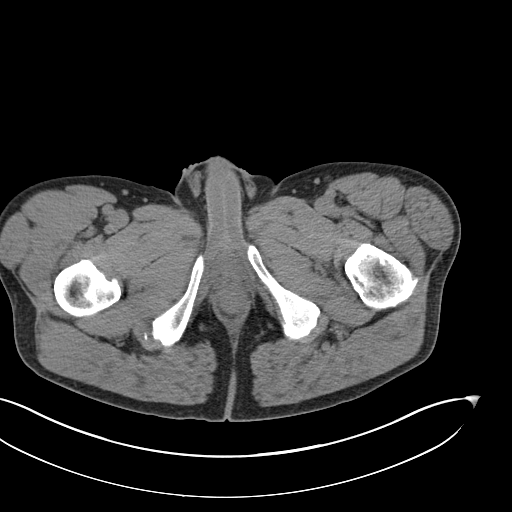
[im 6/111  bone]
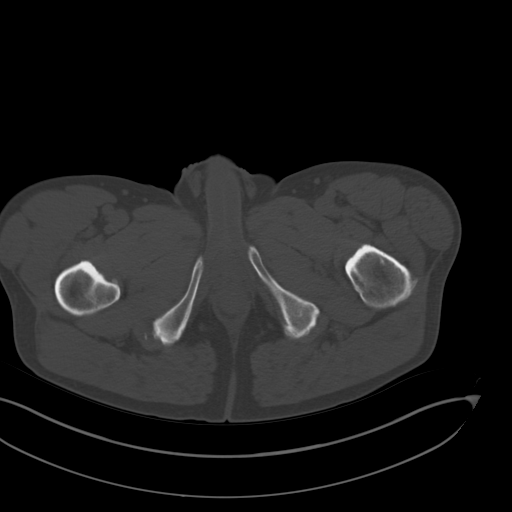
[im 18/111  soft-tissue]
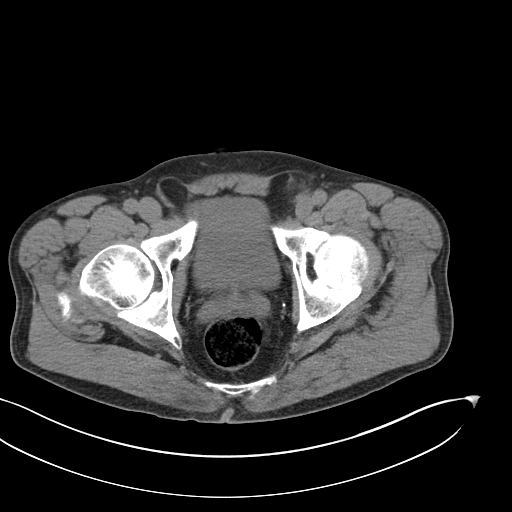
[im 24/111  soft-tissue]
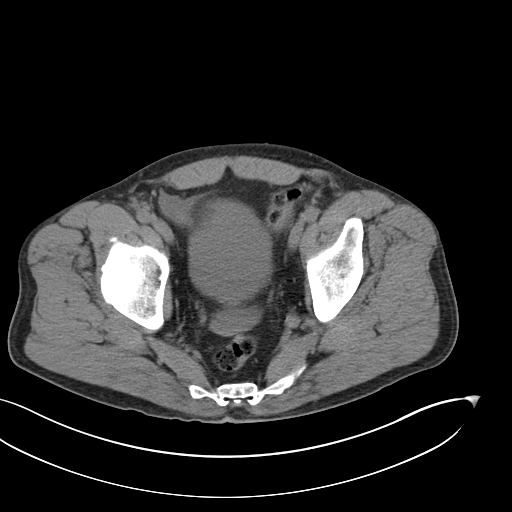
[im 35/111  soft-tissue]
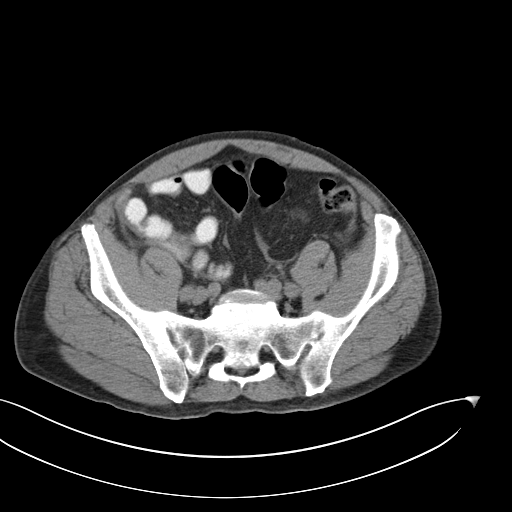
[im 41/111  soft-tissue]
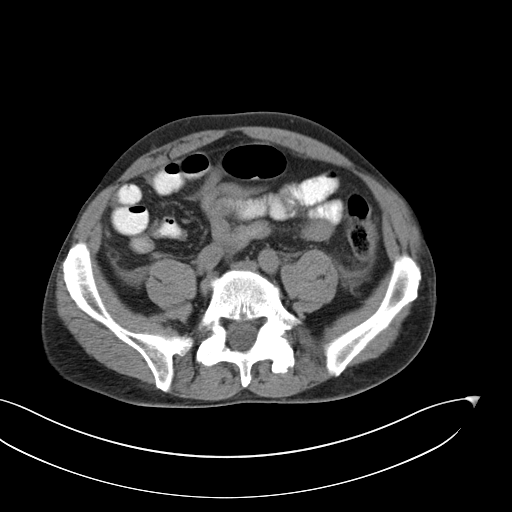
[im 53/111  soft-tissue]
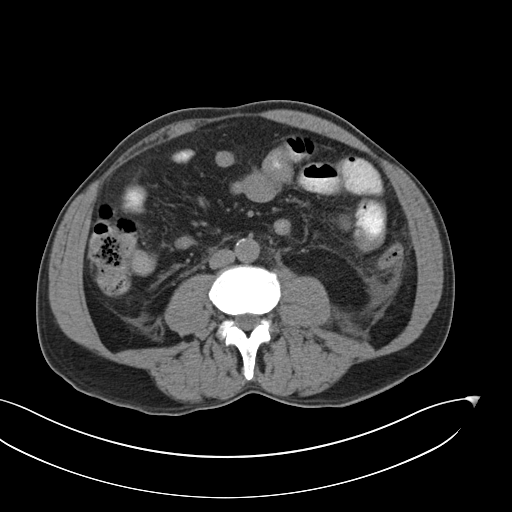
[im 58/111  soft-tissue]
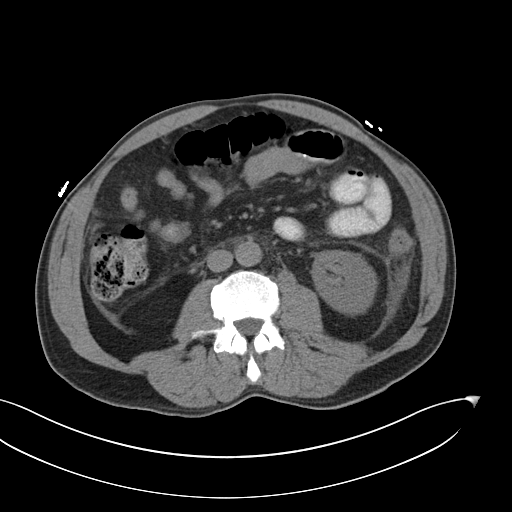
[im 70/111  soft-tissue]
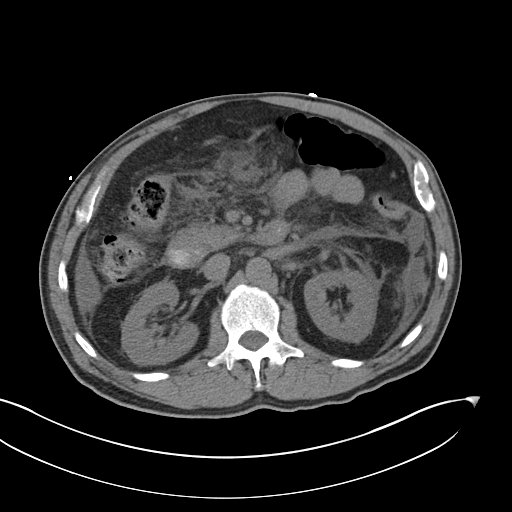
[im 76/111  soft-tissue]
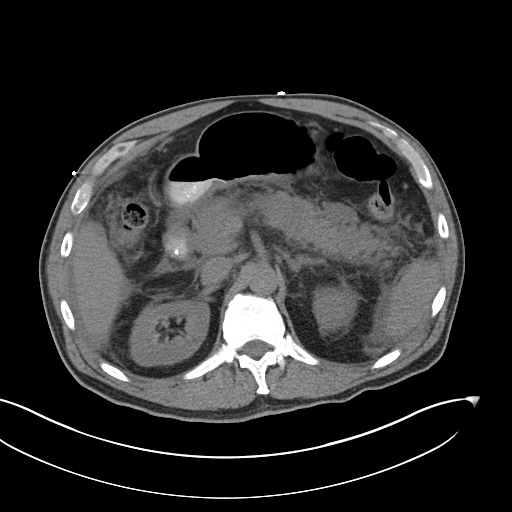
[im 76/111  bone]
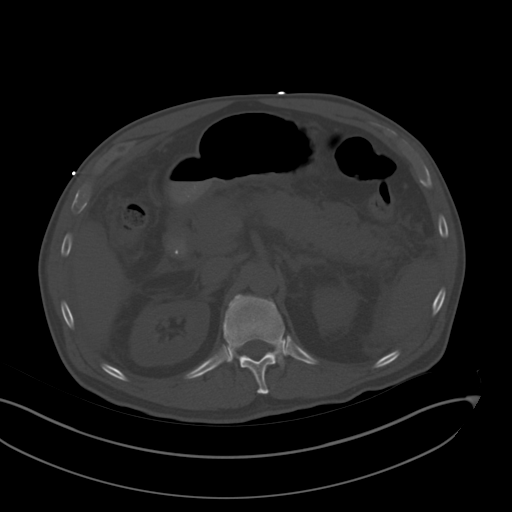
[im 87/111  soft-tissue]
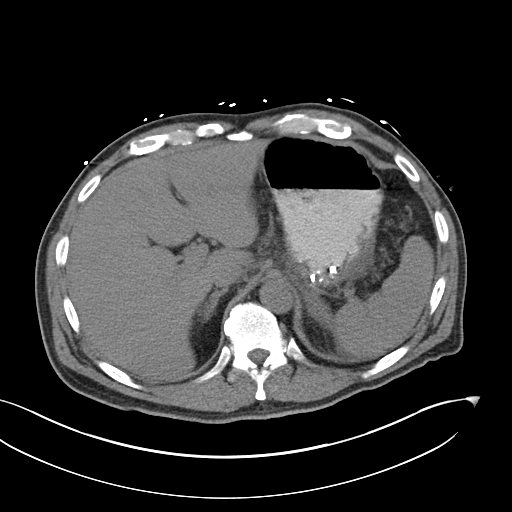
[im 93/111  soft-tissue]
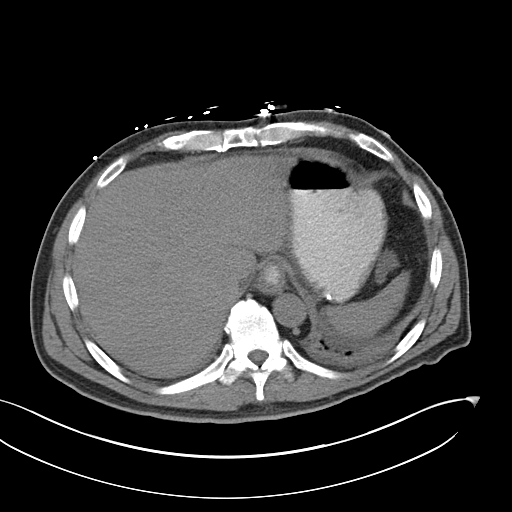
[im 105/111  soft-tissue]
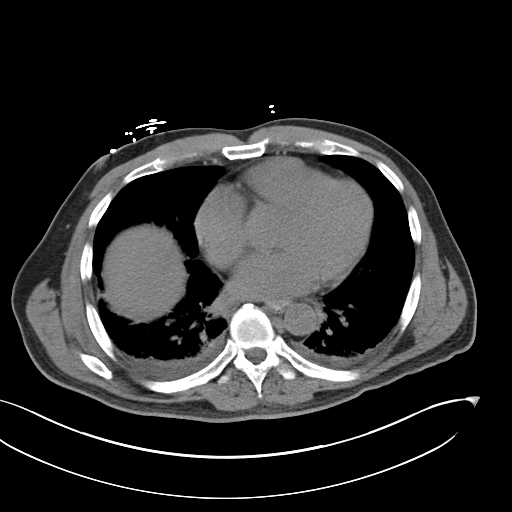

[Series 5: coronal · coronal · 0.74mm/px · 3 of 154 slices shown]
[im 52/154  soft-tissue]
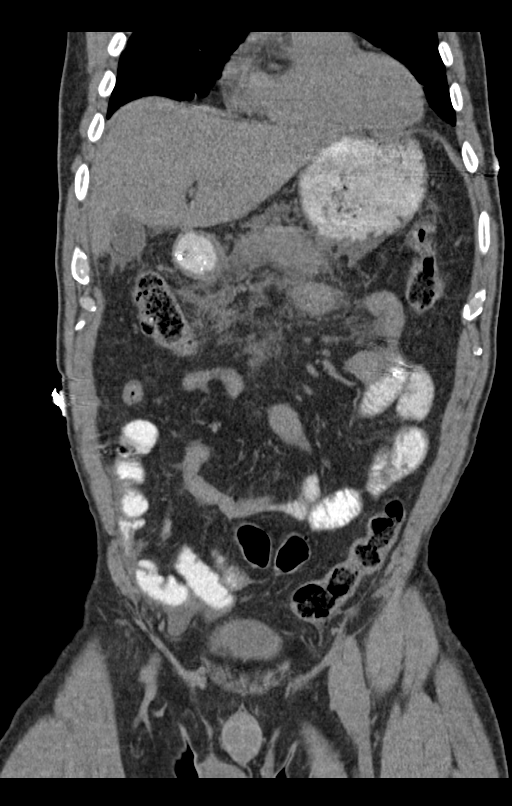
[im 69/154  soft-tissue]
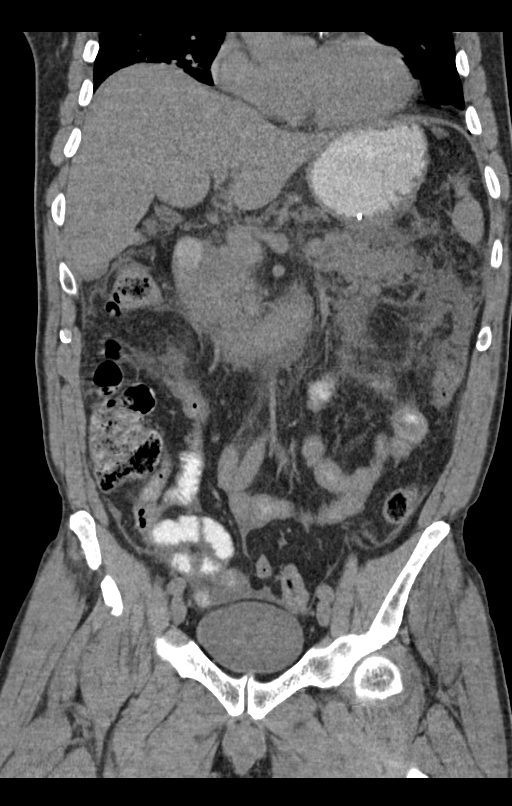
[im 86/154  soft-tissue]
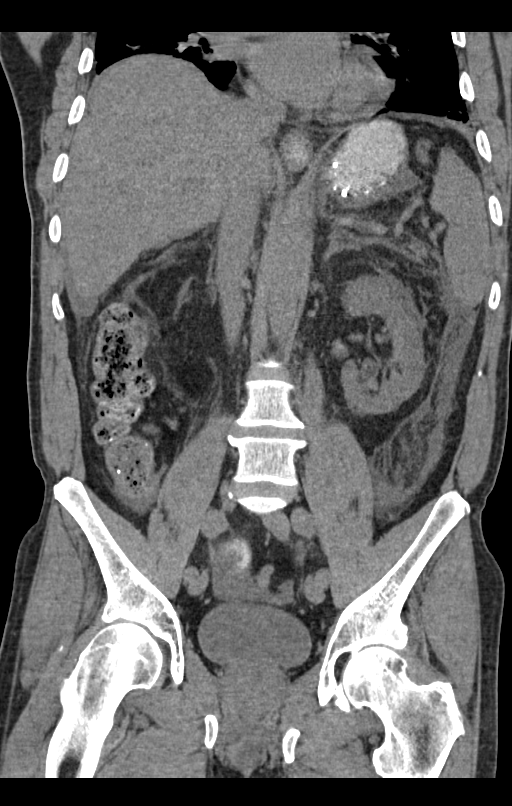

[15 of 46 positions shown; findings below may reference images not displayed]

FINDINGS: Lower chest: Small bilateral pleural effusions and bibasilar
atelectasis. The heart is mildly enlarged. Coronary artery
calcifications are noted. The distal esophagus is grossly normal.

Hepatobiliary: No focal hepatic lesions or intrahepatic biliary
dilatation. There are gallstones and sludge noted in the
gallbladder. No common bile duct dilatation.

Pancreas: The pancreas is enlarged and diffusely inflamed. There is
also extensive peripancreatic inflammation/phlegmon. Small fluid
collections likely represent developing pseudocysts. No pancreatic
mass. No pancreatic duct dilatation. No obvious obstructing common
bile duct stone. There is a small duodenal diverticulum noted at
near the pancreatic head.

Spleen: Normal size. Simple appearing cysts noted. Peripancreatic
fluid.

Adrenals/Urinary Tract: The adrenal glands and kidneys are
unremarkable. No renal, ureteral or bladder calculi or mass.
Moderate fluid and inflammation noted in the anterior pararenal
spaces, left greater than right and also fluid tracking back along
the pericolic gutters.

Stomach/Bowel: The stomach, duodenum, small bowel and colon are
unremarkable. No inflammatory changes, mass lesions or obstructive
findings. The terminal ileum is normal. The appendix is normal.

Vascular/Lymphatic: No significant aortic calcifications or
aneurysm. Small scattered mesenteric and retroperitoneal lymph nodes
but no mass or adenopathy.

Reproductive: The prostate gland and seminal vesicles are
unremarkable.

Other: Small to moderate amount of free pelvic fluid. No inguinal
mass or adenopathy.

Musculoskeletal: No significant bony findings.
IMPRESSION: CT findings consistent with acute pancreatitis as discussed above.

Gallstones and gallbladder sludge are noted. No obvious common bile
duct stone.

Small bilateral pleural effusions and bibasilar atelectasis.

## 2018-08-06 ENCOUNTER — Encounter: Payer: Self-pay | Admitting: Emergency Medicine

## 2018-08-06 DIAGNOSIS — F411 Generalized anxiety disorder: Secondary | ICD-10-CM | POA: Insufficient documentation

## 2018-08-22 ENCOUNTER — Ambulatory Visit (INDEPENDENT_AMBULATORY_CARE_PROVIDER_SITE_OTHER): Payer: BC Managed Care – PPO | Admitting: Psychiatry

## 2018-08-22 ENCOUNTER — Encounter: Payer: Self-pay | Admitting: Psychiatry

## 2018-08-22 DIAGNOSIS — F4010 Social phobia, unspecified: Secondary | ICD-10-CM | POA: Diagnosis not present

## 2018-08-22 DIAGNOSIS — F411 Generalized anxiety disorder: Secondary | ICD-10-CM

## 2018-08-22 DIAGNOSIS — F4001 Agoraphobia with panic disorder: Secondary | ICD-10-CM

## 2018-08-22 MED ORDER — LORAZEPAM 1 MG PO TABS: 1.0000 mg | ORAL_TABLET | Freq: Three times a day (TID) | ORAL | 1 refills | Status: DC | PRN

## 2018-08-22 MED ORDER — CLONAZEPAM 0.5 MG PO TABS: ORAL_TABLET | ORAL | 3 refills | Status: DC

## 2018-08-22 MED ORDER — SERTRALINE HCL 100 MG PO TABS: 200.0000 mg | ORAL_TABLET | Freq: Every day | ORAL | 3 refills | Status: DC

## 2018-08-22 NOTE — Progress Notes (Signed)
[name] [id number] 09/25/1956 62 y.o.  Subjective:   Patient ID:  [name] is a 62 y.o. (DOB 04/18/1956) male.  Chief Complaint:  Chief Complaint  Patient presents with  . Anxiety  . Sleeping Problem    HPI ZARION OLIFF presents to the office today for follow-up of long hix panic and anxiety disorder. Pleased with meds and wants refills.  Needs lorazepam prn for panic when he travels.  Travels a lot [place] son working for [employer].  Needs clonazepam routinely to prevent panic and help sleep.  He realizes he's prone to having anxiety lead to somatic sx and fears.  Patient reports stable mood and denies depressed or irritable moods.  Patient denies any unusual recent difficulty with anxiety, but couldn't get by without Bz.  Occ has near panic sx with somatic sx of dizziness and fear fainting and SOB.  Patient denies difficulty with sleep initiation or maintenance. Denies appetite disturbance.  Patient reports that energy and motivation have been good.  Patient denies any difficulty with concentration.  Patient denies any suicidal ideation.   Review of Systems:  Review of Systems  Neurological: Negative for tremors and weakness.  Psychiatric/Behavioral: Negative for agitation, behavioral problems, confusion, decreased concentration, dysphoric mood, hallucinations, self-injury, sleep disturbance and suicidal ideas. The patient is not nervous/anxious and is not hyperactive.     Medications: I have reviewed the patient's current medications.  Current Outpatient Medications  Medication Sig Dispense Refill  . ADVAIR DISKUS 250-50 MCG/DOSE AEPB Inhale 1 puff into the lungs 2 (two) times daily.    . clonazePAM (KLONOPIN) 0.5 MG tablet Take by mouth at bedtime. 1 in am and 3 at night    . fexofenadine (ALLEGRA) 60 MG tablet Take 60 mg by mouth 2 (two) times daily as needed for allergies or rhinitis.    Marland Kitchen LORazepam (ATIVAN) 1 MG tablet Take 1 mg by mouth every 8 (eight) hours as needed.      . montelukast (SINGULAIR) 10 MG tablet Take 10 mg by mouth at bedtime.    . pantoprazole (PROTONIX) 40 MG tablet Take 40 mg by mouth 2 (two) times daily.     . sertraline (ZOLOFT) 100 MG tablet Take 200 mg by mouth at bedtime.    Marland Kitchen acetaminophen (TYLENOL) 325 MG tablet Take 650 mg by mouth every 6 (six) hours as needed for mild pain.    Marland Kitchen lactobacillus acidophilus (BACID) TABS tablet Take 2 tablets by mouth daily.     No current facility-administered medications for this visit.     Medication Side Effects: None no sleepiness  Allergies: No Known Allergies  Past Medical History:  Diagnosis Date  . Anxiety   . Asthma    uses inhaler  . Pancreatitis     Family History  Problem Relation Age of Onset  . Dementia Mother     Social History   Socioeconomic History  . Marital status: Married    Spouse name: Not on file  . Number of children: Not on file  . Years of education: Not on file  . Highest education level: Not on file  Occupational History  . Not on file  Social Needs  . Financial resource strain: Not on file  . Food insecurity:    Worry: Not on file    Inability: Not on file  . Transportation needs:    Medical: Not on file    Non-medical: Not on file  Tobacco Use  . Smoking status: Never Smoker  .  Smokeless tobacco: Never Used  Substance and Sexual Activity  . Alcohol use: Yes    Alcohol/week: 1.0 standard drinks    Types: 1 Glasses of wine per week  . Drug use: No  . Sexual activity: Yes  Lifestyle  . Physical activity:    Days per week: Not on file    Minutes per session: Not on file  . Stress: Not on file  Relationships  . Social connections:    Talks on phone: Not on file    Gets together: Not on file    Attends religious service: Not on file    Active member of club or organization: Not on file    Attends meetings of clubs or organizations: Not on file    Relationship status: Not on file  . Intimate partner violence:    Fear of current or ex  partner: Not on file    Emotionally abused: Not on file    Physically abused: Not on file    Forced sexual activity: Not on file  Other Topics Concern  . Not on file  Social History Narrative  . Not on file    Past Medical History, Surgical history, Social history, and Family history were reviewed and updated as appropriate.   Please see review of systems for further details on the patient's review from today.   Objective:   Physical Exam:  There were no vitals taken for this visit.  Physical Exam  Constitutional: He is oriented to person, place, and time. He appears well-developed. No distress.  Musculoskeletal: He exhibits no deformity.  Neurological: He is alert and oriented to person, place, and time. He displays no tremor. Coordination and gait normal.  Psychiatric: He has a normal mood and affect. His speech is normal and behavior is normal. Judgment and thought content normal. His mood appears not anxious. His affect is not angry, not blunt, not labile and not inappropriate. Cognition and memory are normal. He does not exhibit a depressed mood. He expresses no homicidal and no suicidal ideation. He expresses no suicidal plans and no homicidal plans.  Insight intact. No auditory or visual hallucinations.  Some chronic residual anxiety which can quickly become somatic but he manages it.    Lab Review:     Component Value Date/Time   NA 140 11/03/2017 0437   K 3.6 11/03/2017 0437   CL 110 11/03/2017 0437   CO2 24 11/03/2017 0437   GLUCOSE 93 11/03/2017 0437   BUN 8 11/03/2017 0437   CREATININE 0.76 11/03/2017 0437   CALCIUM 8.7 (L) 11/03/2017 0437   PROT 6.5 11/03/2017 0437   ALBUMIN 3.5 11/03/2017 0437   AST 18 11/03/2017 0437   ALT 15 (L) 11/03/2017 0437   ALKPHOS 34 (L) 11/03/2017 0437   BILITOT 0.8 11/03/2017 0437   GFRNONAA >60 11/03/2017 0437   GFRAA >60 11/03/2017 0437       Component Value Date/Time   WBC 6.2 11/03/2017 0437   RBC 4.22 11/03/2017 0437    HGB 11.8 (L) 11/03/2017 0437   HCT 36.7 (L) 11/03/2017 0437   PLT 160 11/03/2017 0437   MCV 87.0 11/03/2017 0437   MCH 28.0 11/03/2017 0437   MCHC 32.2 11/03/2017 0437   RDW 17.8 (H) 11/03/2017 0437   LYMPHSABS 1.5 11/01/2017 0048   MONOABS 1.2 (H) 11/01/2017 0048   EOSABS 0.1 11/01/2017 0048   BASOSABS 0.0 11/01/2017 0048    No results found for: POCLITH, LITHIUM   No results found for: PHENYTOIN,  PHENOBARB, VALPROATE, CBMZ   .res Assessment: Plan:    Panic disorder with agoraphobia  Social anxiety disorder  Generalized anxiety disorder   Greater than 50% of face to face time with patient was spent on counseling and coordination of care. We discussed Disc "pre-panic" which he manages without full panic.  He still worries somatically sometimes but not going excessively to the doctor. We discussed the short-term risks associated with benzodiazepines including sedation and increased fall risk among others.  Discussed long-term side effect risk including dependence, potential withdrawal symptoms, and the potential eventual dose-related risk of dementia.  Has been to doctor recently for checkup and doing well.  Answered questions re: confidentiality.  This appointment was 15 minutes  FU 6 mos  Meredith Staggers, MD, DFAPA    Please see After Visit Summary for patient specific instructions.  No future appointments.  No orders of the defined types were placed in this encounter.     -------------------------------

## 2019-02-20 ENCOUNTER — Ambulatory Visit: Payer: BC Managed Care – PPO | Admitting: Psychiatry

## 2019-02-23 ENCOUNTER — Other Ambulatory Visit: Payer: Self-pay | Admitting: Psychiatry

## 2019-02-23 DIAGNOSIS — F4001 Agoraphobia with panic disorder: Secondary | ICD-10-CM

## 2019-02-24 NOTE — Telephone Encounter (Signed)
Just filled for 90 day 03/20, too soon for refill.

## 2019-02-26 ENCOUNTER — Telehealth: Payer: Self-pay | Admitting: Psychiatry

## 2019-02-26 NOTE — Telephone Encounter (Signed)
Received a refill request from pt too, am I figuring its too early or am I wrong?

## 2019-02-26 NOTE — Telephone Encounter (Signed)
Send rx for Clonazapam 0.5mg  to [facility] on [address].

## 2019-02-26 NOTE — Telephone Encounter (Signed)
Last refill 03/20 for 90 day, too soon to fill. Due middle of June

## 2019-02-26 NOTE — Telephone Encounter (Signed)
Looks like the refill request is too early.  It is not due until June middle of the month

## 2019-02-27 NOTE — Telephone Encounter (Signed)
Pharmacy notified too soon

## 2019-03-09 ENCOUNTER — Ambulatory Visit (INDEPENDENT_AMBULATORY_CARE_PROVIDER_SITE_OTHER): Payer: BC Managed Care – PPO | Admitting: Psychiatry

## 2019-03-09 ENCOUNTER — Encounter: Payer: Self-pay | Admitting: Psychiatry

## 2019-03-09 ENCOUNTER — Other Ambulatory Visit: Payer: Self-pay

## 2019-03-09 DIAGNOSIS — F4010 Social phobia, unspecified: Secondary | ICD-10-CM | POA: Diagnosis not present

## 2019-03-09 DIAGNOSIS — F4001 Agoraphobia with panic disorder: Secondary | ICD-10-CM

## 2019-03-09 DIAGNOSIS — F411 Generalized anxiety disorder: Secondary | ICD-10-CM | POA: Diagnosis not present

## 2019-03-09 MED ORDER — CLONAZEPAM 0.5 MG PO TABS: ORAL_TABLET | ORAL | 1 refills | Status: DC

## 2019-03-09 MED ORDER — LORAZEPAM 1 MG PO TABS: 1.0000 mg | ORAL_TABLET | Freq: Three times a day (TID) | ORAL | 1 refills | Status: DC | PRN

## 2019-03-09 MED ORDER — SERTRALINE HCL 100 MG PO TABS: 200.0000 mg | ORAL_TABLET | Freq: Every day | ORAL | 3 refills | Status: DC

## 2019-03-09 NOTE — Progress Notes (Signed)
[name] [id number] 12-Apr-1956 63 y.o.  Subjective:   Patient ID:  [name] is a 63 y.o. (DOB 15-Jul-1956) male.  Chief Complaint:  Chief Complaint  Patient presents with  . Anxiety    Medication Management  . Follow-up    Medication Management  . Medication Refill    Klonopin    Anxiety  Patient reports no confusion, decreased concentration, nervous/anxious behavior or suicidal ideas.    Medication Refill  Pertinent negatives include no weakness.   [name] presents to the office today for follow-up of long hix panic and anxiety disorder.  Last visit August 22, 2018.  No meds were changed.  Overall good.  Covid has affected him and his son's [name] work at [employer] and has been successful and afraid over that.  Worry over finances is better.  Online courses and exercises daily.  Has a lot of interests.  Home projects.   Wife still working.  Pleased with meds and wants refills.  Needs lorazepam prn for panic when he travels and that's rarely.  Needs clonazepam routinely to prevent panic and help sleep.  He realizes he's prone to having anxiety lead to somatic sx and fears.  Patient reports stable mood and denies depressed or irritable moods.  Some anxiety residual and worries about refills. Occ has near panic sx with somatic sx of dizziness and fear fainting and SOB.  Patient denies difficulty with sleep initiation or maintenance. Denies appetite disturbance.  Patient reports that energy and motivation have been good.  Patient denies any difficulty with concentration.  Patient denies any suicidal ideation.  Christian and involved with church.    Past Psychiatric Medication Trials: Clonazepam, lorazepam, sertraline, buspirone, mirtazapine, clonidine Under the care of this office since April 2001  Review of Systems:  Review of Systems  Neurological: Negative for tremors and weakness.  Psychiatric/Behavioral: Negative for agitation, behavioral problems, confusion,  decreased concentration, dysphoric mood, hallucinations, self-injury, sleep disturbance and suicidal ideas. The patient is not nervous/anxious and is not hyperactive.     Medications: I have reviewed the patient's current medications.  Current Outpatient Medications  Medication Sig Dispense Refill  . acetaminophen (TYLENOL) 325 MG tablet Take 650 mg by mouth every 6 (six) hours as needed for mild pain.    Marland Kitchen ADVAIR DISKUS 250-50 MCG/DOSE AEPB Inhale 1 puff into the lungs 2 (two) times daily.    Marland Kitchen albuterol (VENTOLIN HFA) 108 (90 Base) MCG/ACT inhaler INL 2 PFS PO Q 4 H PRN    . clonazePAM (KLONOPIN) 0.5 MG tablet 1 in am and 3 at night 360 tablet 3  . fexofenadine (ALLEGRA) 60 MG tablet Take 60 mg by mouth daily.     . fluticasone (FLONASE) 50 MCG/ACT nasal spray USE 2 SPRAYS INTO EACH NOSTRIL PRN    . lactobacillus acidophilus (BACID) TABS tablet Take 2 tablets by mouth daily.    Marland Kitchen LORazepam (ATIVAN) 1 MG tablet Take 1 tablet (1 mg total) by mouth every 8 (eight) hours as needed. 30 tablet 1  . mometasone-formoterol (DULERA) 200-5 MCG/ACT AERO Inhale into the lungs.    . montelukast (SINGULAIR) 10 MG tablet Take 10 mg by mouth at bedtime.    . pantoprazole (PROTONIX) 40 MG tablet Take 40 mg by mouth 2 (two) times daily.     . sertraline (ZOLOFT) 100 MG tablet Take 2 tablets (200 mg total) by mouth at bedtime. 180 tablet 3   No current facility-administered medications for this visit.  Medication Side Effects: None no sleepiness  Allergies: No Known Allergies  Past Medical History:  Diagnosis Date  . Anxiety   . Asthma    uses inhaler  . Pancreatitis     Family History  Problem Relation Age of Onset  . Dementia Mother     Social History   Socioeconomic History  . Marital status: Married    Spouse name: Not on file  . Number of children: Not on file  . Years of education: Not on file  . Highest education level: Not on file  Occupational History  . Not on file   Social Needs  . Financial resource strain: Not on file  . Food insecurity:    Worry: Not on file    Inability: Not on file  . Transportation needs:    Medical: Not on file    Non-medical: Not on file  Tobacco Use  . Smoking status: Never Smoker  . Smokeless tobacco: Never Used  Substance and Sexual Activity  . Alcohol use: Yes    Alcohol/week: 1.0 standard drinks    Types: 1 Glasses of wine per week  . Drug use: No  . Sexual activity: Yes  Lifestyle  . Physical activity:    Days per week: Not on file    Minutes per session: Not on file  . Stress: Not on file  Relationships  . Social connections:    Talks on phone: Not on file    Gets together: Not on file    Attends religious service: Not on file    Active member of club or organization: Not on file    Attends meetings of clubs or organizations: Not on file    Relationship status: Not on file  . Intimate partner violence:    Fear of current or ex partner: Not on file    Emotionally abused: Not on file    Physically abused: Not on file    Forced sexual activity: Not on file  Other Topics Concern  . Not on file  Social History Narrative  . Not on file    Past Medical History, Surgical history, Social history, and Family history were reviewed and updated as appropriate.   Please see review of systems for further details on the patient's review from today.   Objective:   Physical Exam:  There were no vitals taken for this visit.  Physical Exam Neurological:     Mental Status: He is alert and oriented to person, place, and time.     Cranial Nerves: No dysarthria.  Psychiatric:        Attention and Perception: Attention normal.        Mood and Affect: Mood is anxious. Mood is not depressed.        Speech: Speech normal.        Behavior: Behavior is cooperative.        Thought Content: Thought content normal. Thought content is not paranoid or delusional. Thought content does not include homicidal or suicidal  ideation. Thought content does not include homicidal or suicidal plan.        Cognition and Memory: Cognition and memory normal.        Judgment: Judgment normal.     Comments: Insight good.     Lab Review:     Component Value Date/Time   NA 140 11/03/2017 0437   K 3.6 11/03/2017 0437   CL 110 11/03/2017 0437   CO2 24 11/03/2017 0437   GLUCOSE 93 11/03/2017  0437   BUN 8 11/03/2017 0437   CREATININE 0.76 11/03/2017 0437   CALCIUM 8.7 (L) 11/03/2017 0437   PROT 6.5 11/03/2017 0437   ALBUMIN 3.5 11/03/2017 0437   AST 18 11/03/2017 0437   ALT 15 (L) 11/03/2017 0437   ALKPHOS 34 (L) 11/03/2017 0437   BILITOT 0.8 11/03/2017 0437   GFRNONAA >60 11/03/2017 0437   GFRAA >60 11/03/2017 0437       Component Value Date/Time   WBC 6.2 11/03/2017 0437   RBC 4.22 11/03/2017 0437   HGB 11.8 (L) 11/03/2017 0437   HCT 36.7 (L) 11/03/2017 0437   PLT 160 11/03/2017 0437   MCV 87.0 11/03/2017 0437   MCH 28.0 11/03/2017 0437   MCHC 32.2 11/03/2017 0437   RDW 17.8 (H) 11/03/2017 0437   LYMPHSABS 1.5 11/01/2017 0048   MONOABS 1.2 (H) 11/01/2017 0048   EOSABS 0.1 11/01/2017 0048   BASOSABS 0.0 11/01/2017 0048    No results found for: POCLITH, LITHIUM   No results found for: PHENYTOIN, PHENOBARB, VALPROATE, CBMZ   .res Assessment: Plan:    Panic disorder with agoraphobia  Social anxiety disorder  Generalized anxiety disorder   Greater than 50% of face to face time with patient was spent on counseling and coordination of care. We discussed Disc "pre-panic" which he manages without full panic.  He still worries somatically sometimes but not going excessively to the doctor.  Disc sthe difference between Ativan used prn for panic and clonazepam regularly for prevention.  Good response to meds.  No med changes  We discussed the short-term risks associated with benzodiazepines including sedation and increased fall risk among others.  Discussed long-term side effect risk including  dependence, potential withdrawal symptoms, and the potential eventual dose-related risk of dementia.  Has been to doctor recently for checkup and doing well.  This appointment was 15 minutes  FU 6 mos  Meredith Staggersarey Cottle, MD, DFAPA    Please see After Visit Summary for patient specific instructions.  No future appointments.  No orders of the defined types were placed in this encounter.     -------------------------------

## 2019-07-07 ENCOUNTER — Ambulatory Visit (INDEPENDENT_AMBULATORY_CARE_PROVIDER_SITE_OTHER): Payer: Medicare Other | Admitting: Gastroenterology

## 2019-07-07 ENCOUNTER — Encounter: Payer: Self-pay | Admitting: Gastroenterology

## 2019-07-07 VITALS — BP 118/66 | HR 72 | Temp 97.7°F | Ht 74.0 in | Wt 194.0 lb

## 2019-07-07 DIAGNOSIS — K6289 Other specified diseases of anus and rectum: Secondary | ICD-10-CM | POA: Diagnosis not present

## 2019-07-07 DIAGNOSIS — K5900 Constipation, unspecified: Secondary | ICD-10-CM

## 2019-07-07 DIAGNOSIS — Z862 Personal history of diseases of the blood and blood-forming organs and certain disorders involving the immune mechanism: Secondary | ICD-10-CM | POA: Diagnosis not present

## 2019-07-07 DIAGNOSIS — Z8719 Personal history of other diseases of the digestive system: Secondary | ICD-10-CM

## 2019-07-07 DIAGNOSIS — K648 Other hemorrhoids: Secondary | ICD-10-CM

## 2019-07-07 DIAGNOSIS — Z9049 Acquired absence of other specified parts of digestive tract: Secondary | ICD-10-CM

## 2019-07-07 NOTE — Patient Instructions (Signed)
Your provider has requested that you go to the basement level for lab work before leaving today. Press "B" on the elevator. The lab is located at the first door on the left as you exit the elevator.   Start Fibercon once daily.   Start Colace 200mg - once daily   If you are age 65 or older, your body mass index should be between 23-30. Your Body mass index is 24.91 kg/m. If this is out of the aforementioned range listed, please consider follow up with your Primary Care Provider.  If you are age 63 or younger, your body mass index should be between 19-25. Your Body mass index is 24.91 kg/m. If this is out of the aformentioned range listed, please consider follow up with your Primary Care Provider.    Thank you for choosing me and [facility].  Dr. Rush Landmark

## 2019-07-08 ENCOUNTER — Other Ambulatory Visit (INDEPENDENT_AMBULATORY_CARE_PROVIDER_SITE_OTHER): Payer: Medicare Other

## 2019-07-08 DIAGNOSIS — K5900 Constipation, unspecified: Secondary | ICD-10-CM | POA: Diagnosis not present

## 2019-07-08 DIAGNOSIS — Z9049 Acquired absence of other specified parts of digestive tract: Secondary | ICD-10-CM | POA: Insufficient documentation

## 2019-07-08 DIAGNOSIS — K6289 Other specified diseases of anus and rectum: Secondary | ICD-10-CM | POA: Diagnosis not present

## 2019-07-08 DIAGNOSIS — Z862 Personal history of diseases of the blood and blood-forming organs and certain disorders involving the immune mechanism: Secondary | ICD-10-CM | POA: Insufficient documentation

## 2019-07-08 DIAGNOSIS — K648 Other hemorrhoids: Secondary | ICD-10-CM | POA: Insufficient documentation

## 2019-07-08 DIAGNOSIS — Z8719 Personal history of other diseases of the digestive system: Secondary | ICD-10-CM | POA: Insufficient documentation

## 2019-07-08 LAB — IBC + FERRITIN
Ferritin: 61.5 ng/mL (ref 22.0–322.0)
Iron: 119 ug/dL (ref 42–165)
Saturation Ratios: 32.8 % (ref 20.0–50.0)
Transferrin: 259 mg/dL (ref 212.0–360.0)

## 2019-07-08 LAB — COMPREHENSIVE METABOLIC PANEL
ALT: 25 U/L (ref 0–53)
AST: 22 U/L (ref 0–37)
Albumin: 4.5 g/dL (ref 3.5–5.2)
Alkaline Phosphatase: 50 U/L (ref 39–117)
BUN: 19 mg/dL (ref 6–23)
CO2: 30 mEq/L (ref 19–32)
Calcium: 10 mg/dL (ref 8.4–10.5)
Chloride: 102 mEq/L (ref 96–112)
Creatinine, Ser: 1.04 mg/dL (ref 0.40–1.50)
GFR: 71.63 mL/min (ref >60.00)
Glucose, Bld: 90 mg/dL (ref 70–99)
Potassium: 4.1 mEq/L (ref 3.5–5.1)
Sodium: 141 mEq/L (ref 135–145)
Total Bilirubin: 0.8 mg/dL (ref 0.2–1.2)
Total Protein: 7.6 g/dL (ref 6.0–8.3)

## 2019-07-08 LAB — PHOSPHORUS: Phosphorus: 3.5 mg/dL (ref 2.3–4.6)

## 2019-07-08 LAB — TSH: TSH: 2.43 u[IU]/mL (ref 0.35–4.50)

## 2019-07-08 LAB — CBC
HCT: 48.6 % (ref 39.0–52.0)
Hemoglobin: 16.4 g/dL (ref 13.0–17.0)
MCHC: 33.8 g/dL (ref 30.0–36.0)
MCV: 99.7 fl (ref 78.0–100.0)
Platelets: 207 10*3/uL (ref 150.0–400.0)
RBC: 4.88 Mil/uL (ref 4.22–5.81)
RDW: 12.6 % (ref 11.5–15.5)
WBC: 5.5 10*3/uL (ref 4.0–10.5)

## 2019-07-08 LAB — MAGNESIUM: Magnesium: 1.9 mg/dL (ref 1.5–2.5)

## 2019-07-08 LAB — VITAMIN B12: Vitamin B-12: 442 pg/mL (ref 211–911)

## 2019-07-08 LAB — FOLATE: Folate: 17.5 ng/mL (ref >5.9)

## 2019-07-08 LAB — PROTIME-INR
INR: 1.1 ratio — ABNORMAL HIGH (ref 0.8–1.0)
Prothrombin Time: 12.5 s (ref 9.6–13.1)

## 2019-07-08 NOTE — Progress Notes (Signed)
[place] VISIT   Primary Care Provider Antony Contras, MD [address] [place] [address] [phone]  Referring Provider Antony Contras, MD [address] [place],  [place] [address] [phone]  Patient Profile: [name] is a 63 y.o. male with a pmh significant for pancreatitis complicated by peripancreatic fluid collections requiring IR drainage (status post cholecystectomy but also had episode post cholecystectomy and not clear if this is idiopathic pancreatitis), allergies, anxiety, asthma, GERD, history of PUD (duodenal ulcer requiring treatment), hemorrhoids.  The patient presents to the [facility] for an evaluation and management of problem(s) noted below:  Problem List 1. Rectal pain   2. Internal and External Hemorrhoids   3. Constipation, unspecified constipation type   4. History of anemia   5. History of pancreatitis   6. Hx of cholecystectomy   7. History of duodenal ulcer     History of Present Illness This is the patient's first visit to the outpatient [place] clinic.  He has been followed by [facility] gastroenterology and last seen in August of this year.  I met this patient when I was in advanced endoscopy fellow at [facility] when he was referred for evaluation of peripancreatic fluid collections with a drain in place.  I do not have access to my prior records and for some reason care everywhere is not working completely.  What I can recall is that the patient ended up having need for percutaneous drain imaging that showed near resolution at some point after his referral with our ability to remove the IR drains.  The patient ended up returning to [facility] in the setting of having bleeding while taking significant nonsteroidals for his pain from pancreatitis.  He underwent an endoscopy requiring treatment of a duodenal ulcer.  He also underwent a colonoscopy which I cannot recall the results but the  patient was told he had a normal colon and was needing a 10-year follow-up.  The patient followed up with [facility] in 2019 per the patient's report but again we need to get access to those records.  He has noted a new nodule over the course of the last few months in his rectum.  This occurred after he had some significant constipation and straining.  He has not noted any bleeding.  He cannot recall if he had hemorrhoids on his colonoscopy when he was admitted to [facility] with a GI bleed.  He has been using Preparation H and that has been helpful.  The patient does not usually strain but had issues at that time a few months ago.  His bowel movements are light brown to dark brown but never black like when he was bleeding.  He denies hematochezia.  He is staying fit through [employer] sne[employer] at [employer].  GI Review of Systems Positive as above Negative for pyrosis (he is on medications), dysphagia, odynophagia, nausea, vomiting  Review of Systems General: Denies fevers/chills/weight loss HEENT: Denies oral lesions Cardiovascular: Denies chest pain/palpitations Pulmonary: Denies shortness of breath Gastroenterological: See HPI Genitourinary: Denies darkened urine Hematological: Denies easy bruising/bleeding Endocrine: Denies temperature intolerance Dermatological: Denies jaundice Psychological: Mood is stable   Medications Current Outpatient Medications  Medication Sig Dispense Refill   acetaminophen (TYLENOL) 325 MG tablet Take 650 mg by mouth every 6 (six) hours as needed for mild pain.     ADVAIR DISKUS 250-50 MCG/DOSE AEPB Inhale 1 puff into the lungs 2 (two) times daily.  VENTOLIN HFA) 108 (90 Base) MCG/ACT inhaler INL 2 PFS PO Q 4 H PRN    °• clonazePAM (KLONOPIN) 0.5 MG tablet 1 in am and 3 at night 360 tablet 1  °• fexofenadine (ALLEGRA) 60 MG tablet Take 60 mg by mouth daily.     °• LORazepam (ATIVAN) 1 MG tablet Take 1 tablet (1 mg total) by mouth every 8 (eight) hours as  needed. 30 tablet 1  °• montelukast (SINGULAIR) 10 MG tablet Take 10 mg by mouth at bedtime.    °• pantoprazole (PROTONIX) 40 MG tablet Take 40 mg by mouth 2 (two) times daily.     °• sertraline (ZOLOFT) 100 MG tablet Take 2 tablets (200 mg total) by mouth at bedtime. 180 tablet 3  ° °No current facility-administered medications for this visit.   ° ° °Allergies °No Known Allergies ° °Histories °Past Medical History:  °Diagnosis Date  °• Anxiety   °• Asthma   ° uses inhaler  °• Gallstones   °• Pancreatitis   ° °Past Surgical History:  °Procedure Laterality Date  °• CHOLECYSTECTOMY N/A 12/13/2016  ° Procedure: LAPAROSCOPIC CHOLECYSTECTOMY WITH INTRAOPERATIVE CHOLANGIOGRAM;  Surgeon: Paul [name], MD;  Location: [facility];  Service: General;  Laterality: N/A;  °• GUM SURGERY    °• IR CATHETER TUBE CHANGE  02/27/2017  °• IR RADIOLOGIST EVAL & MGMT  02/13/2017  °• IR RADIOLOGIST EVAL & MGMT  03/12/2017  ° °Social History  ° °Socioeconomic History  °• Marital status: Married  °  Spouse name: Not on file  °• Number of children: Not on file  °• Years of education: Not on file  °• Highest education level: Not on file  °Occupational History  °• Not on file  °Social Needs  °• Financial resource strain: Not on file  °• Food insecurity  °  Worry: Not on file  °  Inability: Not on file  °• Transportation needs  °  Medical: Not on file  °  Non-medical: Not on file  °Tobacco Use  °• Smoking status: Never Smoker  °• Smokeless tobacco: Never Used  °Substance and Sexual Activity  °• Alcohol use: Yes  °  Alcohol/week: 1.0 standard drinks  °  Types: 1 Glasses of wine per week  °• Drug use: No  °• Sexual activity: Yes  °Lifestyle  °• Physical activity  °  Days per week: Not on file  °  Minutes per session: Not on file  °• Stress: Not on file  °Relationships  °• Social connections  °  Talks on phone: Not on file  °  Gets together: Not on file  °  Attends religious service: Not on file  °  Active member of club or organization: Not on file  °   Attends meetings of clubs or organizations: Not on file  °  Relationship status: Not on file  °• Intimate partner violence  °  Fear of current or ex partner: Not on file  °  Emotionally abused: Not on file  °  Physically abused: Not on file  °  Forced sexual activity: Not on file  °Other Topics Concern  °• Not on file  °Social History Narrative  °• Not on file  ° °Family History  °Problem Relation Age of Onset  °• Dementia Mother   ° °I have reviewed his medical, social, and family history in detail and updated the electronic medical record as necessary.  ° ° °PHYSICAL EXAMINATION  °BP 118/66    Pulse   Pulse 72    Temp 97.7 F (36.5 C) (Oral)    Ht 6' 2" (1.88 m)    Wt 194 lb (88 kg)    BMI 24.91 kg/m  Wt Readings from Last 3 Encounters:  07/07/19 194 lb (88 kg)  03/12/17 169 lb (76.7 kg)  01/27/17 168 lb (76.2 kg)  GEN: NAD, appears stated age, doesn't appear chronically ill PSYCH: Cooperative, without pressured speech EYE: Conjunctivae pink, sclerae anicteric ENT: MMM, without oral ulcers, no erythema or exudates noted NECK: Supple CV: RR without R/Gs  RESP: CTAB posteriorly, without wheezing GI: NABS, soft, NT/ND, without rebound or guarding, no HSM appreciated GU: DRE shows evidence of external hemorrhoids with irritation and erythema, internal hemorrhoids palpated but no rectal mass or lesion has been noted, normal tone and perineal descent MSK/EXT: No lower extremity edema SKIN: No jaundice NEURO:  Alert & Oriented x 3, no focal deficits   REVIEW OF DATA  I reviewed the following data at the time of this encounter:  GI Procedures and Studies  Prior EGD and colonoscopy and EUS procedure notes will need to be obtained from [facility] as I cannot access them on care everywhere at this time  Laboratory Studies  Reviewed labs in system with last being in 2019 with evidence of anemia without microcytosis  Imaging Studies  January 2019 CT abdomen pelvis IMPRESSION: 1. Indistinct edematous  appearing pancreas with moderate surrounding fluid and inflammatory changes, consistent with acute pancreatitis. No well-formed focal fluid collections are seen at this time. Grossly homogeneous pancreatic enhancement. There are however tiny gas locules at the uncinate process, pancreatic neck, and within a tiny fluid collection near the tail of the pancreas; patient was noted on previous imaging to have infected pseudocyst/abscess with similar but larger gas collections, close imaging follow-up recommended. 2. There are no other acute or significant abnormalities visualized.   ASSESSMENT  Mr. Schmieg is a 63 y.o. male with a pmh significant for pancreatitis complicated by peripancreatic fluid collections requiring IR drainage (status post cholecystectomy but also had episode post cholecystectomy and not clear if this is idiopathic pancreatitis), allergies, anxiety, asthma, GERD, history of PUD (duodenal ulcer requiring treatment), hemorrhoids.  The patient is seen today for evaluation and management of:  1. Rectal pain   2. Internal and External Hemorrhoids   3. Constipation, unspecified constipation type   4. History of anemia   5. History of pancreatitis   6. Hx of cholecystectomy   7. History of duodenal ulcer    The patient is hemodynamically stable.  His reasoning for evaluation today was the rectal discomfort and nodule that is likely external and internal hemorrhoids that are playing a role.  He is not having any bleeding and things have improved while being on Preparation H.  Imaging noted in the chart and I have placed a picture in the media tab of current issues.  We are going to focus on stool softeners and fiber supplementation to try and optimize things for him if he continues to have discomfort after we review his colonoscopy from [place] we will consider role of any endoscopic evaluation versus referral to our colorectal colleagues since there is a significant component to external  hemorrhoids here.  The patient's other history in regards to his pancreatitis and imaging findings and endoscopic ultrasound and prior ulcer disease were reviewed briefly but we do not have access to all of these records for some reason and care everywhere not allowing that today.  We will get  reports of his EGDs and colonoscopies and endoscopic ultrasound.  After completion of today's visit I reviewed the last imaging that was noted from a cross-sectional perspective and that was in January 2019 which showed an edematous appearing pancreas consistent with acute pancreatitis but no imaging has been performed since then.  I think considering cross-sectional imaging in the future before the end of the year may be reasonable to come to see if anything has evolved over the course of the almost 2 years.  We will discuss that further at next clinic visit when patient follows up prior to our referral to colorectal surgery if needed.  We will obtain laboratories to understand the possibility of his underlying constipation and to make sure that there are no electrolyte or metabolic issues that are causing him issues.  His anemia we will evaluate his blood counts and ensure he does not have a micronutrient or iron deficiency.  Further management or work-up or labs to be dictated by his primary provider.  All patient questions were answered, to the best of my ability, and the patient agrees to the aforementioned plan of action with follow-up as indicated. ° ° °PLAN  °Laboratories as outlined below °Start FiberCon once daily °Start Colace 200 mg daily °Preparation H or over-the-counter ointment/cream reasonable to pursue °Sitz bath nightly if possible °Consider Anusol suppositories °If continued issues in future with optimization of stool habits then colorectal surgery for consideration of hemorrhoid surgical intervention in future °At follow-up visit or further discussions would like to discuss possible repeat  cross-sectional imaging to ensure healing of his prior pancreas issues (will need to discuss with him as this was noted after his completion of his clinic visit) °Obtain [facility] EGD/EUS/colonoscopy report for review ° ° °Orders Placed This Encounter  °Procedures  °• CBC  °• Comp Met (CMET)  °• Calcium, ionized  °• Magnesium  °• Phosphorus  °• TSH  °• IBC + Ferritin  °• Reticulocytes  °• Haptoglobin  °• B12  °• Folate  °• INR/PT  ° ° °New Prescriptions  ° No medications on file  ° °Modified Medications  ° No medications on file  ° ° °Planned Follow Up °No follow-ups on file. ° ° °Gabriel Mansouraty, MD °[facility] °Advanced Endoscopy °Office # [phone] °

## 2019-07-09 LAB — RETICULOCYTES
ABS Retic: 60000 cells/uL (ref 25000–9000)
Retic Ct Pct: 1.2 %

## 2019-07-09 LAB — CALCIUM, IONIZED: Calcium, Ion: 5.24 mg/dL (ref 4.8–5.6)

## 2019-08-20 ENCOUNTER — Ambulatory Visit (INDEPENDENT_AMBULATORY_CARE_PROVIDER_SITE_OTHER): Payer: Medicare Other | Admitting: Gastroenterology

## 2019-08-20 ENCOUNTER — Encounter: Payer: Self-pay | Admitting: Gastroenterology

## 2019-08-20 VITALS — BP 108/74 | HR 74 | Temp 98.1°F | Ht 74.0 in | Wt 192.4 lb

## 2019-08-20 DIAGNOSIS — K5900 Constipation, unspecified: Secondary | ICD-10-CM

## 2019-08-20 DIAGNOSIS — Z8719 Personal history of other diseases of the digestive system: Secondary | ICD-10-CM | POA: Diagnosis not present

## 2019-08-20 DIAGNOSIS — K648 Other hemorrhoids: Secondary | ICD-10-CM | POA: Diagnosis not present

## 2019-08-20 DIAGNOSIS — R935 Abnormal findings on diagnostic imaging of other abdominal regions, including retroperitoneum: Secondary | ICD-10-CM | POA: Insufficient documentation

## 2019-08-20 MED ORDER — PANTOPRAZOLE SODIUM 40 MG PO TBEC: 40.0000 mg | DELAYED_RELEASE_TABLET | Freq: Two times a day (BID) | ORAL | 11 refills | Status: DC

## 2019-08-20 NOTE — Progress Notes (Signed)
GASTROENTEROLOGY OUTPATIENT CLINIC VISIT   Primary Care Provider Tally JoeSwayne, Brittan, MD [address] [place] [id number] [phone]  Patient Profile: [name] is a 63 y.o. male with a pmh significant for pancreatitis complicated by peripancreatic fluid collections requiring IR drainage (status post cholecystectomy but also had episode post cholecystectomy and not clear if this is idiopathic pancreatitis versus gallstone related), allergies, anxiety, asthma, GERD, history of PUD (duodenal ulcer requiring treatment), hemorrhoids.  The patient presents to the [facility] for an evaluation and management of problem(s) noted below:  Problem List 1. History of pancreatitis   2. History of abnormal CT of the abdomen   3. Internal and External Hemorrhoids   4. Constipation, unspecified constipation type     History of Present Illness Please see initial consultation note for full details of HPI.  Interval History The patient has done well with fiber supplementation.  If he takes FiberCon once to twice daily he uses the restroom daily.  He is now cutting back to once daily and not having as many issues regards to having to go frequently during the day.  He is happy with where things are.  The patient is describing some issues of stranguria and dribbling during urination.  Only wakes up once at night to use the restroom.  He drinks lots of water.  No other significant GI symptoms.  We were able to get his endoscopic records which will be scanned into the chart but did suggest a normal colonoscopy with 10-year follow-up other than findings of internal hemorrhoids.  His last endoscopy showed a small 3 mm ulcer and was felt to no longer require follow-up endoscopies and this was performed [facility] my [facility].  He was happy we were able to get the records.  We also discussed the role of surveillance imaging of his pancreas since his not been imaged in  almost a year and a half.  He does need his PPI refilled.  GI Review of Systems Positive as above including pyrosis (he is on PPI therapy) Negative for dysphagia, odynophagia, nausea, vomiting, abdominal pain, change in bowel habits, hematochezia, melena  Review of Systems General: Denies fevers/chills Cardiovascular: Denies chest pain/palpitations Pulmonary: Denies shortness of breath Gastroenterological: See HPI Genitourinary: Denies darkened urine Hematological: Denies easy bruising/bleeding Dermatological: Denies jaundice Psychological: Mood is stable   Medications Current Outpatient Medications  Medication Sig Dispense Refill   acetaminophen (TYLENOL) 325 MG tablet Take 650 mg by mouth every 6 (six) hours as needed for mild pain.     ADVAIR DISKUS 250-50 MCG/DOSE AEPB Inhale 1 puff into the lungs 2 (two) times daily.     albuterol (VENTOLIN HFA) 108 (90 Base) MCG/ACT inhaler INL 2 PFS PO Q 4 H PRN     clonazePAM (KLONOPIN) 0.5 MG tablet 1 in am and 3 at night 360 tablet 1   fexofenadine (ALLEGRA) 60 MG tablet Take 60 mg by mouth daily.      LORazepam (ATIVAN) 1 MG tablet Take 1 tablet (1 mg total) by mouth every 8 (eight) hours as needed. 30 tablet 1   montelukast (SINGULAIR) 10 MG tablet Take 10 mg by mouth at bedtime.     pantoprazole (PROTONIX) 40 MG tablet Take 1 tablet (40 mg total) by mouth 2 (two) times daily. 60 tablet 11   sertraline (ZOLOFT) 100 MG tablet Take 2 tablets (200 mg total) by mouth at bedtime. 180 tablet 3   No current facility-administered  medications for this visit.     Allergies No Known Allergies  Histories Past Medical History:  Diagnosis Date   Anxiety    Asthma    uses inhaler   Gallstones    Pancreatitis    Past Surgical History:  Procedure Laterality Date   CHOLECYSTECTOMY N/A 12/13/2016   Procedure: LAPAROSCOPIC CHOLECYSTECTOMY WITH INTRAOPERATIVE CHOLANGIOGRAM;  Surgeon: Chevis Pretty III, MD;  Location: [facility];  Service:  General;  Laterality: N/A;   GUM SURGERY     IR CATHETER TUBE CHANGE  02/27/2017   IR RADIOLOGIST EVAL & MGMT  02/13/2017   IR RADIOLOGIST EVAL & MGMT  03/12/2017   Social History   Socioeconomic History   Marital status: Married    Spouse name: Not on file   Number of children: Not on file   Years of education: Not on file   Highest education level: Not on file  Occupational History   Not on file  Social Needs   Financial resource strain: Not on file   Food insecurity    Worry: Not on file    Inability: Not on file   Transportation needs    Medical: Not on file    Non-medical: Not on file  Tobacco Use   Smoking status: Never Smoker   Smokeless tobacco: Never Used  Substance and Sexual Activity   Alcohol use: Yes    Alcohol/week: 1.0 standard drinks    Types: 1 Glasses of wine per week   Drug use: No   Sexual activity: Yes  Lifestyle   Physical activity    Days per week: Not on file    Minutes per session: Not on file   Stress: Not on file  Relationships   Social connections    Talks on phone: Not on file    Gets together: Not on file    Attends religious service: Not on file    Active member of club or organization: Not on file    Attends meetings of clubs or organizations: Not on file    Relationship status: Not on file   Intimate partner violence    Fear of current or ex partner: Not on file    Emotionally abused: Not on file    Physically abused: Not on file    Forced sexual activity: Not on file  Other Topics Concern   Not on file  Social History Narrative   Not on file   Family History  Problem Relation Age of Onset   Dementia Mother    I have reviewed his medical, social, and family history in detail and updated the electronic medical record as necessary.    PHYSICAL EXAMINATION  BP 108/74 (BP Location: Left Arm, Patient Position: Sitting, Cuff Size: Normal)    Pulse 74    Temp 98.1 F (36.7 C) (Other (Comment))    Ht   (1.88 m)    Wt 192 lb 6 oz (87.3 kg)    BMI 24.70 kg/m  Wt Readings from Last 3 Encounters:  08/20/19 192 lb 6 oz (87.3 kg)  07/07/19 194 lb (88 kg)  03/12/17 169 lb (76.7 kg)  GEN: NAD, appears stated age, doesn't appear chronically ill PSYCH: Cooperative, without pressured speech EYE: Conjunctivae pink, sclerae anicteric ENT: MMM CV: RR without R/Gs  RESP: CTAB posteriorly, without wheezing GI: NABS, soft, NT/ND, without rebound or guarding, no HSM appreciated GU: DRE shows evidence of external hemorrhoids with decreased irritation and erythema, palpated prolapsed internal hemorrhoids, no palpable rectal  mass or prostate nodularity noted, normal rectal tone and perineal descent MSK/EXT: No lower extremity edema SKIN: No jaundice NEURO:  Alert & Oriented x 3, no focal deficits   REVIEW OF DATA  I reviewed the following data at the time of this encounter:  GI Procedures and Studies  To be scanned into chart  Laboratory Studies  Reviewed those in epic  Imaging Studies  No new studies to review   ASSESSMENT  [name] is a 63 y.o. male  with a pmh significant for pancreatitis complicated by peripancreatic fluid collections requiring IR drainage (status post cholecystectomy but also had episode post cholecystectomy and not clear if this is idiopathic pancreatitis versus gallstone related), allergies, anxiety, asthma, GERD, history of PUD (duodenal ulcer requiring treatment), hemorrhoids.  The patient is seen today for evaluation and management of:  1. History of pancreatitis   2. History of abnormal CT of the abdomen   3. Internal and External Hemorrhoids   4. Constipation, unspecified constipation type    The patient is hemodynamically and clinically stable.  He is doing well on fiber supplementation via diet as well as supplement.  Bowel movements are doing well.  Hemorrhoids seem to be less of an issue currently.  As such I think we can hold off on hemorrhoidal banding but may  be something to consider in the near future and I can have him see one of my partners if necessary.  He will continue to use Preparation H as needed.  Colon cancer screening due in 2029.  Patient and I discussed the results of his last imaging study back in 2019 and we think it would be worthwhile for [place] to review and see how things look.  I anticipate things will be much better if not normalized but without significant his pancreatic necrosis was he may have some scarring or effects that we may not see until we repeat cross-sectional imaging.  He wants to get this done in [place] and I am okay with that but he will need to get his imaging study CD and bring it to [place] so that my radiology group can also evaluated as well.  His anemia was worked up and he has normalized his blood counts and all of his numbers do not suggest any need for any supplementation.  All patient questions were answered, to the best of my ability, and the patient agrees to the aforementioned plan of action with follow-up as indicated.   PLAN  Continue Colace 200 mg daily FiberCon once daily Preparation H as needed Sitz bath as needed Anusol suppositories can be considered in future if necessary Consider evaluation by my partners to do hemorrhoidal banding in future if bleeding recurs Colonoscopy for screening in 2028 CT abdomen pelvis with IV and oral contrast to evaluate history of prior pancreatitis and abnormal imaging Patient to discuss with PCP any additional work-up for symptoms that may be suggestive of BPH Protonix 40 mg refill x1 year   Orders Placed This Encounter  Procedures   CT Abdomen Pelvis W Contrast    New Prescriptions   No medications on file   Modified Medications   Modified Medication Previous Medication   PANTOPRAZOLE (PROTONIX) 40 MG TABLET pantoprazole (PROTONIX) 40 MG tablet      Take 1 tablet (40 mg total) by mouth 2 (two) times daily.    Take 40 mg by mouth 2 (two) times daily.      Planned Follow Up No follow-ups on file.  Justice Britain, MD [facility] Office # [phone]

## 2019-08-20 NOTE — Patient Instructions (Signed)
If you are age 65 or older, your body mass index should be between 23-30. Your Body mass index is 24.7 kg/m. If this is out of the aforementioned range listed, please consider follow up with your Primary Care Provider.  If you are age 18 or younger, your body mass index should be between 19-25. Your Body mass index is 24.7 kg/m. If this is out of the aformentioned range listed, please consider follow up with your Primary Care Provider.    You will have a CT scan done at [facility] at your request. Order given to you today to take with you.   We have sent the following medications to your pharmacy for you to pick up at your convenience: Protonix   Thank you for choosing me and [facility].  Dr. Rush Landmark

## 2019-08-27 ENCOUNTER — Encounter: Payer: Self-pay | Admitting: Gastroenterology

## 2019-08-27 NOTE — Progress Notes (Signed)
This is for documentation purposes  The patient earlier this week dropped off his CD to be uploaded into our chart.  The patient had his CT scan performed at [facility] per his request with results as below:  FINDINGS:  VISUALIZED LOWER THORAX: No acute abnormalities. Cardiomegaly. Bibasilar atelectasis SOLID VISCERA: Liver: Normal. Gallbladder: Gallbladder is surgically absent. Pancreas: Normal. Adrenal glands: Normal. Spleen: Normal. Kidneys: Normal. GI: No bowel obstruction. No focal bowel wall thickening or inflammatory changes. Normal appendix.  PERITONEAL CAVITY/RETROPERITONEUM: No free fluid. No pneumoperitoneum. No lymphadenopathy. Aorta, IVC, iliac arteries, and major visceral arteries are grossly normal. PELVIS: No acute abnormalities. MUSCULOSKELETAL: No acute or destructive osseous processes. IMPRESSION: 1.  No acute abnormality in the abdomen or pelvis. No CT evidence of acute pancreatitis.  This is good news. We will have the CD uploaded to try and have our radiologist review it as well and ensure that they see nothing else is concerning. If this is the case and the patient will not require any further repeat imaging at this point in time. I called and spoke with the patient and his wife this morning. Once we actually have the images uploaded I will be able to further evaluate but I am very happy to see what we are seeing currently.   Justice Britain, MD [facility] Office # [phone]

## 2019-09-09 ENCOUNTER — Ambulatory Visit: Payer: BC Managed Care – PPO | Admitting: Psychiatry

## 2019-12-17 ENCOUNTER — Other Ambulatory Visit: Payer: Self-pay

## 2019-12-17 ENCOUNTER — Telehealth: Payer: Self-pay | Admitting: Psychiatry

## 2019-12-17 DIAGNOSIS — F4001 Agoraphobia with panic disorder: Secondary | ICD-10-CM

## 2019-12-17 MED ORDER — CLONAZEPAM 0.5 MG PO TABS: ORAL_TABLET | ORAL | 0 refills | Status: DC

## 2019-12-17 NOTE — Telephone Encounter (Signed)
Last refill 08/20/2019 #360, pended for Dr. Jennelle Human to submit for 90 day supply

## 2019-12-17 NOTE — Telephone Encounter (Signed)
[name] called to request refill of his clonazepam.  Said the pharmacy doesn't seem to be sending it.  He was overdue for appt so made appt for 12/31/19, but will need refill before the appt.  He said he had about a week supply.  Send to [facility] on [address]

## 2019-12-31 ENCOUNTER — Ambulatory Visit (INDEPENDENT_AMBULATORY_CARE_PROVIDER_SITE_OTHER): Payer: Medicare PPO | Admitting: Psychiatry

## 2019-12-31 ENCOUNTER — Encounter: Payer: Self-pay | Admitting: Psychiatry

## 2019-12-31 ENCOUNTER — Other Ambulatory Visit: Payer: Self-pay

## 2019-12-31 DIAGNOSIS — F411 Generalized anxiety disorder: Secondary | ICD-10-CM

## 2019-12-31 DIAGNOSIS — F4010 Social phobia, unspecified: Secondary | ICD-10-CM

## 2019-12-31 DIAGNOSIS — F4001 Agoraphobia with panic disorder: Secondary | ICD-10-CM | POA: Diagnosis not present

## 2019-12-31 MED ORDER — LORAZEPAM 1 MG PO TABS: 1.0000 mg | ORAL_TABLET | Freq: Three times a day (TID) | ORAL | 1 refills | Status: DC | PRN

## 2019-12-31 MED ORDER — CLONAZEPAM 0.5 MG PO TABS: ORAL_TABLET | ORAL | 1 refills | Status: DC

## 2019-12-31 MED ORDER — SERTRALINE HCL 100 MG PO TABS: 200.0000 mg | ORAL_TABLET | Freq: Every day | ORAL | 3 refills | Status: DC

## 2019-12-31 NOTE — Progress Notes (Signed)
[name] [id number] 10-Jun-1956 64 y.o.  Subjective:   Patient ID:  [name] is a 64 y.o. (DOB 1955-10-13) male.  Chief Complaint:  Chief Complaint  Patient presents with  . Follow-up    Medication Management  . Other    Panic disorder with agoraphobia  . Anxiety    Anxiety Patient reports no confusion, decreased concentration, dizziness, nervous/anxious behavior or suicidal ideas.    Medication Refill Pertinent negatives include no weakness.   [name] presents to the office today for follow-up of long hix panic and anxiety disorder.  Last visit June 2020.  No meds were changed. On clonazepam 0.5 mg 1 in Am and 1.5 mg HS and sertraline 200 mg and Ativan prn panic.  Overall good.  Covid has affected him and his son's [name] work at [employer] and has been successful and afraid over that.  Worry over finances is better.  Online courses and exercises daily.  Has a lot of interests.  Home projects.   Wife still working.  Pleased with meds and wants refills.  Needs lorazepam prn for panic when he travels and that's rarely.  Needs clonazepam routinely to prevent panic and help sleep.  He realizes he's prone to having anxiety lead to somatic sx and fears.  Patient reports stable mood and denies depressed or irritable moods.  Some anxiety residual and worries about refills. Occ has near panic sx with somatic sx of dizziness and fear fainting and SOB.  Patient denies difficulty with sleep initiation or maintenance. Denies appetite disturbance.  Patient reports that energy and motivation have been good.  Patient denies any difficulty with concentration.  Patient denies any suicidal ideation.  Christian and involved with church.   Son in [place], [place]  Past Psychiatric Medication Trials: Clonazepam, lorazepam, sertraline, buspirone, mirtazapine, clonidine Under the care of this office since April 2001  Review of Systems:  Review of Systems  Neurological: Negative for dizziness,  tremors and weakness.  Psychiatric/Behavioral: Negative for agitation, behavioral problems, confusion, decreased concentration, dysphoric mood, hallucinations, self-injury, sleep disturbance and suicidal ideas. The patient is not nervous/anxious and is not hyperactive.     Medications: I have reviewed the patient's current medications.  Current Outpatient Medications  Medication Sig Dispense Refill  . acetaminophen (TYLENOL) 325 MG tablet Take 650 mg by mouth every 6 (six) hours as needed for mild pain.    Marland Kitchen ADVAIR DISKUS 250-50 MCG/DOSE AEPB Inhale 1 puff into the lungs 2 (two) times daily.    Marland Kitchen albuterol (VENTOLIN HFA) 108 (90 Base) MCG/ACT inhaler INL 2 PFS PO Q 4 H PRN    . clonazePAM (KLONOPIN) 0.5 MG tablet 1 in am and 3 at night 360 tablet 1  . fexofenadine (ALLEGRA) 60 MG tablet Take 60 mg by mouth daily.     Marland Kitchen LORazepam (ATIVAN) 1 MG tablet Take 1 tablet (1 mg total) by mouth every 8 (eight) hours as needed. 30 tablet 1  . pantoprazole (PROTONIX) 40 MG tablet Take 1 tablet (40 mg total) by mouth 2 (two) times daily. 60 tablet 11  . sertraline (ZOLOFT) 100 MG tablet Take 2 tablets (200 mg total) by mouth at bedtime. 180 tablet 3   No current facility-administered medications for this visit.    Medication Side Effects: None no sleepiness  Allergies: No Known Allergies  Past Medical History:  Diagnosis Date  . Anxiety   . Asthma    uses inhaler  . Gallstones   . Pancreatitis  Family History  Problem Relation Age of Onset  . Dementia Mother     Social History   Socioeconomic History  . Marital status: Married    Spouse name: Not on file  . Number of children: Not on file  . Years of education: Not on file  . Highest education level: Not on file  Occupational History  . Not on file  Tobacco Use  . Smoking status: Never Smoker  . Smokeless tobacco: Never Used  Substance and Sexual Activity  . Alcohol use: Yes    Alcohol/week: 1.0 standard drinks    Types: 1  Glasses of wine per week  . Drug use: No  . Sexual activity: Yes  Other Topics Concern  . Not on file  Social History Narrative  . Not on file   Social Determinants of Health   Financial Resource Strain:   . Difficulty of Paying Living Expenses:   Food Insecurity:   . Worried About Programme researcher, broadcasting/film/video in the Last Year:   . Barista in the Last Year:   Transportation Needs:   . Freight forwarder (Medical):   Marland Kitchen Lack of Transportation (Non-Medical):   Physical Activity:   . Days of Exercise per Week:   . Minutes of Exercise per Session:   Stress:   . Feeling of Stress :   Social Connections:   . Frequency of Communication with Friends and Family:   . Frequency of Social Gatherings with Friends and Family:   . Attends Religious Services:   . Active Member of Clubs or Organizations:   . Attends Banker Meetings:   Marland Kitchen Marital Status:   Intimate Partner Violence:   . Fear of Current or Ex-Partner:   . Emotionally Abused:   Marland Kitchen Physically Abused:   . Sexually Abused:     Past Medical History, Surgical history, Social history, and Family history were reviewed and updated as appropriate.   Please see review of systems for further details on the patient's review from today.   Objective:   Physical Exam:  There were no vitals taken for this visit.  Physical Exam Constitutional:      General: He is not in acute distress. Musculoskeletal:        General: No deformity.  Neurological:     Mental Status: He is alert and oriented to person, place, and time.     Cranial Nerves: No dysarthria.     Coordination: Coordination normal.  Psychiatric:        Attention and Perception: Attention and perception normal. He does not perceive auditory or visual hallucinations.        Mood and Affect: Mood is anxious. Mood is not depressed. Affect is not labile, blunt, angry or inappropriate.        Speech: Speech normal.        Behavior: Behavior normal. Behavior is  cooperative.        Thought Content: Thought content normal. Thought content is not paranoid or delusional. Thought content does not include homicidal or suicidal ideation. Thought content does not include homicidal or suicidal plan.        Cognition and Memory: Cognition and memory normal.        Judgment: Judgment normal.     Comments: Insight good. Talkative and pleasant     Lab Review:     Component Value Date/Time   NA 141 07/08/2019 0735   K 4.1 07/08/2019 0735   CL 102 07/08/2019  0735   CO2 30 07/08/2019 0735   GLUCOSE 90 07/08/2019 0735   BUN 19 07/08/2019 0735   CREATININE 1.04 07/08/2019 0735   CALCIUM 10.0 07/08/2019 0735   PROT 7.6 07/08/2019 0735   ALBUMIN 4.5 07/08/2019 0735   AST 22 07/08/2019 0735   ALT 25 07/08/2019 0735   ALKPHOS 50 07/08/2019 0735   BILITOT 0.8 07/08/2019 0735   GFRNONAA >60 11/03/2017 0437   GFRAA >60 11/03/2017 0437       Component Value Date/Time   WBC 5.5 07/08/2019 0735   RBC 4.88 07/08/2019 0735   HGB 16.4 07/08/2019 0735   HCT 48.6 07/08/2019 0735   PLT 207.0 07/08/2019 0735   MCV 99.7 07/08/2019 0735   MCH 28.0 11/03/2017 0437   MCHC 33.8 07/08/2019 0735   RDW 12.6 07/08/2019 0735   LYMPHSABS 1.5 11/01/2017 0048   MONOABS 1.2 (H) 11/01/2017 0048   EOSABS 0.1 11/01/2017 0048   BASOSABS 0.0 11/01/2017 0048    No results found for: POCLITH, LITHIUM   No results found for: PHENYTOIN, PHENOBARB, VALPROATE, CBMZ   .res Assessment: Plan:    Panic disorder with agoraphobia - Plan: LORazepam (ATIVAN) 1 MG tablet, clonazePAM (KLONOPIN) 0.5 MG tablet, sertraline (ZOLOFT) 100 MG tablet  Social anxiety disorder - Plan: sertraline (ZOLOFT) 100 MG tablet  Generalized anxiety disorder - Plan: sertraline (ZOLOFT) 100 MG tablet   Greater than 50% of face to face time with patient was spent on counseling and coordination of care. We discussed Disc "pre-panic" which he manages without full panic.  He still worries somatically  sometimes but not going excessively to the doctor.  Disc sthe difference between Ativan used prn for panic and clonazepam regularly for prevention.  Good response to meds.  No med changes  We discussed the short-term risks associated with benzodiazepines including sedation and increased fall risk among others.  Discussed long-term side effect risk including dependence, potential withdrawal symptoms, and the potential eventual dose-related risk of dementia.  Has been to doctor recently for checkup and doing well.  Answered questions about Covid vaccine. Supportive therapy dealng with anxiety about it. Also answered questions about use of Singulair and it's use bc he couldn't get it refilled from [facility] and changed doctors.  This appointment was 15 minutes  FU 9-12 mos  Lynder Parents, MD, DFAPA    Please see After Visit Summary for patient specific instructions.  No future appointments.  No orders of the defined types were placed in this encounter.     -------------------------------

## 2020-05-13 ENCOUNTER — Other Ambulatory Visit: Payer: Self-pay | Admitting: Psychiatry

## 2020-05-13 DIAGNOSIS — F411 Generalized anxiety disorder: Secondary | ICD-10-CM

## 2020-05-13 DIAGNOSIS — F4010 Social phobia, unspecified: Secondary | ICD-10-CM

## 2020-05-13 DIAGNOSIS — F4001 Agoraphobia with panic disorder: Secondary | ICD-10-CM

## 2020-07-05 ENCOUNTER — Other Ambulatory Visit: Payer: Self-pay | Admitting: Psychiatry

## 2020-07-05 DIAGNOSIS — F4001 Agoraphobia with panic disorder: Secondary | ICD-10-CM

## 2020-07-29 ENCOUNTER — Other Ambulatory Visit: Payer: Self-pay | Admitting: Gastroenterology

## 2020-09-09 ENCOUNTER — Telehealth: Payer: Self-pay | Admitting: Gastroenterology

## 2020-09-09 MED ORDER — PANTOPRAZOLE SODIUM 40 MG PO TBEC: DELAYED_RELEASE_TABLET | ORAL | 4 refills | Status: DC

## 2020-09-09 NOTE — Telephone Encounter (Signed)
Refill for Protonix sent to pharmacy.

## 2020-09-22 ENCOUNTER — Encounter: Payer: Self-pay | Admitting: Psychiatry

## 2020-09-22 ENCOUNTER — Other Ambulatory Visit: Payer: Self-pay

## 2020-09-22 ENCOUNTER — Ambulatory Visit (INDEPENDENT_AMBULATORY_CARE_PROVIDER_SITE_OTHER): Payer: Medicare PPO | Admitting: Psychiatry

## 2020-09-22 DIAGNOSIS — F411 Generalized anxiety disorder: Secondary | ICD-10-CM

## 2020-09-22 DIAGNOSIS — F4010 Social phobia, unspecified: Secondary | ICD-10-CM | POA: Diagnosis not present

## 2020-09-22 DIAGNOSIS — F4001 Agoraphobia with panic disorder: Secondary | ICD-10-CM

## 2020-09-22 MED ORDER — SERTRALINE HCL 100 MG PO TABS: 200.0000 mg | ORAL_TABLET | Freq: Every day | ORAL | 3 refills | Status: DC

## 2020-09-22 MED ORDER — LORAZEPAM 1 MG PO TABS: 1.0000 mg | ORAL_TABLET | Freq: Three times a day (TID) | ORAL | 1 refills | Status: DC | PRN

## 2020-09-22 MED ORDER — CLONAZEPAM 0.5 MG PO TABS: ORAL_TABLET | ORAL | 1 refills | Status: DC

## 2020-09-22 NOTE — Progress Notes (Signed)
[name] [id number] 1956-08-06 64 y.o.  Subjective:   Patient ID:  [name] is a 64 y.o. (DOB 1956/02/06) male.  Chief Complaint:  Chief Complaint  Patient presents with  . Follow-up  . Anxiety    Anxiety Patient reports no confusion, decreased concentration, dizziness, nervous/anxious behavior, palpitations or suicidal ideas.    Medication Refill Pertinent negatives include no weakness.   [name] presents to the office today for follow-up of long hix panic and anxiety disorder.  Last visit June 2020.  No meds were changed. On clonazepam 0.5 mg 1 in Am and 1.5 mg HS and sertraline 200 mg and Ativan prn panic.  Overall good.  Covid has affected him and his son's [name] work at [employer] and has been successful and afraid over that.  Worry over finances is better.  Online courses and exercises daily.  Has a lot of interests.  Home projects.   Wife still working. Pleased with meds and wants refills.  Needs lorazepam prn for panic when he travels and that's rarely.  Needs clonazepam routinely to prevent panic and help sleep.  He realizes he's prone to having anxiety lead to somatic sx and fears. Plan: no med changes  09/22/20 appt with following noted: Overall still good with manageable anxiety.  Gets trip anxiety still but still goes when he can.  Health good.  Read and run and active at church and helps a lot. Still needs Ativan for travel but otherwise ok with clonazepam. Asks questions about dreaming that seemed intense.  Patient reports stable mood and denies depressed or irritable moods.  Some anxiety residual and worries about refills. Occ has near panic sx with somatic sx of dizziness and fear fainting and SOB.  Patient denies difficulty with sleep initiation or maintenance. Denies appetite disturbance.  Patient reports that energy and motivation have been good.  Patient denies any difficulty with concentration.  Patient denies any suicidal ideation.  Christian and  involved with church.   Son in [place], [place]  Past Psychiatric Medication Trials: Clonazepam, lorazepam, sertraline, buspirone, mirtazapine, clonidine Under the care of this office since April 2001  Review of Systems:  Review of Systems  Cardiovascular: Negative for palpitations.  Neurological: Negative for dizziness, tremors and weakness.  Psychiatric/Behavioral: Negative for agitation, behavioral problems, confusion, decreased concentration, dysphoric mood, hallucinations, self-injury, sleep disturbance and suicidal ideas. The patient is not nervous/anxious and is not hyperactive.     Medications: I have reviewed the patient's current medications.  Current Outpatient Medications  Medication Sig Dispense Refill  . acetaminophen (TYLENOL) 325 MG tablet Take 650 mg by mouth every 6 (six) hours as needed for mild pain.    Marland Kitchen ADVAIR DISKUS 250-50 MCG/DOSE AEPB Inhale 1 puff into the lungs 2 (two) times daily.    Marland Kitchen albuterol (VENTOLIN HFA) 108 (90 Base) MCG/ACT inhaler INL 2 PFS PO Q 4 H PRN    . fexofenadine (ALLEGRA) 60 MG tablet Take 60 mg by mouth daily.    . pantoprazole (PROTONIX) 40 MG tablet TAKE 1 TABLET(40 MG) BY MOUTH TWICE DAILY 60 tablet 4  . clonazePAM (KLONOPIN) 0.5 MG tablet TAKE 1 TABLET BY MOUTH EVERY MORNING AND 3 TABLET BY MOUTH EVERY NIGHT AT BEDTIME 360 tablet 1  . LORazepam (ATIVAN) 1 MG tablet Take 1 tablet (1 mg total) by mouth every 8 (eight) hours as needed. 30 tablet 1  . sertraline (ZOLOFT) 100 MG tablet Take 2 tablets (200 mg total) by mouth daily. 180 tablet  3   No current facility-administered medications for this visit.    Medication Side Effects: None no sleepiness  Allergies: No Known Allergies  Past Medical History:  Diagnosis Date  . Anxiety   . Asthma    uses inhaler  . Gallstones   . Pancreatitis     Family History  Problem Relation Age of Onset  . Dementia Mother     Social History   Socioeconomic History  . Marital status:  Married    Spouse name: Not on file  . Number of children: Not on file  . Years of education: Not on file  . Highest education level: Not on file  Occupational History  . Not on file  Tobacco Use  . Smoking status: Never Smoker  . Smokeless tobacco: Never Used  Substance and Sexual Activity  . Alcohol use: Yes    Alcohol/week: 1.0 standard drink    Types: 1 Glasses of wine per week  . Drug use: No  . Sexual activity: Yes  Other Topics Concern  . Not on file  Social History Narrative  . Not on file   Social Determinants of Health   Financial Resource Strain: Not on file  Food Insecurity: Not on file  Transportation Needs: Not on file  Physical Activity: Not on file  Stress: Not on file  Social Connections: Not on file  Intimate Partner Violence: Not on file    Past Medical History, Surgical history, Social history, and Family history were reviewed and updated as appropriate.   Please see review of systems for further details on the patient's review from today.   Objective:   Physical Exam:  There were no vitals taken for this visit.  Physical Exam Constitutional:      General: He is not in acute distress. Musculoskeletal:        General: No deformity.  Neurological:     Mental Status: He is alert and oriented to person, place, and time.     Cranial Nerves: No dysarthria.     Coordination: Coordination normal.  Psychiatric:        Attention and Perception: Attention and perception normal. He does not perceive auditory or visual hallucinations.        Mood and Affect: Mood is anxious. Mood is not depressed. Affect is not labile, blunt, angry or inappropriate.        Speech: Speech normal. Speech is not slurred.        Behavior: Behavior normal. Behavior is cooperative.        Thought Content: Thought content normal. Thought content is not paranoid or delusional. Thought content does not include homicidal or suicidal ideation. Thought content does not include  homicidal or suicidal plan.        Cognition and Memory: Cognition and memory normal.        Judgment: Judgment normal.     Comments: Insight good. Talkative and pleasant     Lab Review:     Component Value Date/Time   NA 141 07/08/2019 0735   K 4.1 07/08/2019 0735   CL 102 07/08/2019 0735   CO2 30 07/08/2019 0735   GLUCOSE 90 07/08/2019 0735   BUN 19 07/08/2019 0735   CREATININE 1.04 07/08/2019 0735   CALCIUM 10.0 07/08/2019 0735   PROT 7.6 07/08/2019 0735   ALBUMIN 4.5 07/08/2019 0735   AST 22 07/08/2019 0735   ALT 25 07/08/2019 0735   ALKPHOS 50 07/08/2019 0735   BILITOT 0.8 07/08/2019 0735  GFRNONAA >60 11/03/2017 0437   GFRAA >60 11/03/2017 0437       Component Value Date/Time   WBC 5.5 07/08/2019 0735   RBC 4.88 07/08/2019 0735   HGB 16.4 07/08/2019 0735   HCT 48.6 07/08/2019 0735   PLT 207.0 07/08/2019 0735   MCV 99.7 07/08/2019 0735   MCH 28.0 11/03/2017 0437   MCHC 33.8 07/08/2019 0735   RDW 12.6 07/08/2019 0735   LYMPHSABS 1.5 11/01/2017 0048   MONOABS 1.2 (H) 11/01/2017 0048   EOSABS 0.1 11/01/2017 0048   BASOSABS 0.0 11/01/2017 0048    No results found for: POCLITH, LITHIUM   No results found for: PHENYTOIN, PHENOBARB, VALPROATE, CBMZ   .res Assessment: Plan:    Panic disorder with agoraphobia - Plan: clonazePAM (KLONOPIN) 0.5 MG tablet, sertraline (ZOLOFT) 100 MG tablet, LORazepam (ATIVAN) 1 MG tablet  Social anxiety disorder - Plan: sertraline (ZOLOFT) 100 MG tablet  Generalized anxiety disorder - Plan: sertraline (ZOLOFT) 100 MG tablet   Greater than 50% of face to face time with patient was spent on counseling and coordination of care. We discussed Disc "pre-panic" which he manages without full panic.  He still worries somatically sometimes but not going excessively to the doctor.  Disc sthe difference between Ativan used prn for panic esp with travel and clonazepam regularly for prevention.  Disc value of routine.    Answered  questions about dreaming.  Disc writing around dreams.  Good response to meds.  No med changes  We discussed the short-term risks associated with benzodiazepines including sedation and increased fall risk among others.  Discussed long-term side effect risk including dependence, potential withdrawal symptoms, and the potential eventual dose-related risk of dementia.  Has been to doctor recently for checkup and doing well.  Got Covid vaccine  This appointment was 15 minutes  FU 9-12 mos  Meredith Staggers, MD, DFAPA    Please see After Visit Summary for patient specific instructions.  No future appointments.  No orders of the defined types were placed in this encounter.     -------------------------------

## 2020-11-20 ENCOUNTER — Other Ambulatory Visit: Payer: Self-pay | Admitting: Gastroenterology

## 2020-11-21 ENCOUNTER — Other Ambulatory Visit: Payer: Self-pay | Admitting: Gastroenterology

## 2021-03-13 ENCOUNTER — Telehealth: Payer: Self-pay | Admitting: Psychiatry

## 2021-03-13 ENCOUNTER — Other Ambulatory Visit: Payer: Self-pay

## 2021-03-13 DIAGNOSIS — F4001 Agoraphobia with panic disorder: Secondary | ICD-10-CM

## 2021-03-13 MED ORDER — CLONAZEPAM 0.5 MG PO TABS: ORAL_TABLET | ORAL | 0 refills | Status: DC

## 2021-03-13 NOTE — Telephone Encounter (Signed)
Next visit is 12-23-2053. Requesting refill on Clonazepam 0.5 mg called to:  [name] - [place], [place] - [address] AT [facility] [address] Phone:  [phone]  Fax:  [phone]

## 2021-03-13 NOTE — Telephone Encounter (Signed)
pended

## 2021-04-14 ENCOUNTER — Other Ambulatory Visit: Payer: Self-pay | Admitting: Psychiatry

## 2021-04-14 DIAGNOSIS — F4001 Agoraphobia with panic disorder: Secondary | ICD-10-CM

## 2021-04-14 NOTE — Telephone Encounter (Signed)
Pt called requesting early refill for Clonazepam. Will be out of [place] for a [place] leaving 7/14. Pt has enough now to last until 7/12. Just need to have before 7/14. Contact # Pt [phone] if questions. Stated pharmacy will also notify us

## 2021-04-14 NOTE — Telephone Encounter (Signed)
Please review

## 2021-04-17 ENCOUNTER — Other Ambulatory Visit: Payer: Self-pay | Admitting: Gastroenterology

## 2021-06-21 ENCOUNTER — Ambulatory Visit (INDEPENDENT_AMBULATORY_CARE_PROVIDER_SITE_OTHER): Payer: Medicare PPO | Admitting: Psychiatry

## 2021-06-21 ENCOUNTER — Other Ambulatory Visit: Payer: Self-pay

## 2021-06-21 ENCOUNTER — Encounter: Payer: Self-pay | Admitting: Psychiatry

## 2021-06-21 DIAGNOSIS — F4001 Agoraphobia with panic disorder: Secondary | ICD-10-CM | POA: Diagnosis not present

## 2021-06-21 DIAGNOSIS — F4010 Social phobia, unspecified: Secondary | ICD-10-CM

## 2021-06-21 DIAGNOSIS — F411 Generalized anxiety disorder: Secondary | ICD-10-CM

## 2021-06-21 MED ORDER — SERTRALINE HCL 100 MG PO TABS: 200.0000 mg | ORAL_TABLET | Freq: Every day | ORAL | 3 refills | Status: DC

## 2021-06-21 MED ORDER — CLONAZEPAM 0.5 MG PO TABS: ORAL_TABLET | ORAL | 1 refills | Status: DC

## 2021-06-21 MED ORDER — LORAZEPAM 1 MG PO TABS: 1.0000 mg | ORAL_TABLET | Freq: Three times a day (TID) | ORAL | 1 refills | Status: DC | PRN

## 2021-06-21 NOTE — Progress Notes (Signed)
[name] [id number] Jan 03, 1956 65 y.o.  Subjective:   Patient ID:  [name] is a 65 y.o. (DOB [birth year]) male.  Chief Complaint:  Chief Complaint  Patient presents with   Follow-up   Anxiety    Anxiety Patient reports no chest pain, confusion, decreased concentration, dizziness, nervous/anxious behavior, palpitations or suicidal ideas.    Medication Refill Pertinent negatives include no chest pain or weakness.  [name] presents to the office today for follow-up of long hix panic and anxiety disorder.  visit June 2020.  No meds were changed. On clonazepam 0.5 mg 1 in Am and 1.5 mg HS and sertraline 200 mg and Ativan prn panic.  Overall good.  Covid has affected him and his son's [name] work at [employer] and has been successful and afraid over that.  Worry over finances is better.  Online courses and exercises daily.  Has a lot of interests.  Home projects.   Wife still working. Pleased with meds and wants refills.  Needs lorazepam prn for panic when he travels and that's rarely.  Needs clonazepam routinely to prevent panic and help sleep.  He realizes he's prone to having anxiety lead to somatic sx and fears. Plan: no med changes  09/22/20 appt with following noted: Overall still good with manageable anxiety.  Gets trip anxiety still but still goes when he can.  Health good.  Read and run and active at church and helps a lot. Still needs Ativan for travel but otherwise ok with clonazepam. Asks questions about dreaming that seemed intense. Plan no changes  06/21/21 appt noted: Good with church and travel. Still some latent anxiety, like when wife leaves on trip.  All the negativity in world is hard.  Reading more.  Real active at church. Using Ativan just prn travel.  Patient reports stable mood and denies depressed or irritable moods.  Some anxiety residual and worries about refills. Occ has near panic sx with somatic sx of dizziness and fear fainting and SOB.  Patient  denies difficulty with sleep initiation or maintenance. Denies appetite disturbance.  Patient reports that energy and motivation have been good.  Patient denies any difficulty with concentration.  Patient denies any suicidal ideation.  Christian and involved with church.   Son in [place], [place]  Past Psychiatric Medication Trials: Clonazepam, lorazepam,  buspirone, clonidine  sertraline, mirtazapine,  Under the care of this office since April 2001  Review of Systems:  Review of Systems  Cardiovascular:  Negative for chest pain and palpitations.  Neurological:  Negative for dizziness, tremors and weakness.  Psychiatric/Behavioral:  Negative for agitation, behavioral problems, confusion, decreased concentration, dysphoric mood, hallucinations, self-injury, sleep disturbance and suicidal ideas. The patient is not nervous/anxious and is not hyperactive.    Medications: I have reviewed the patient's current medications.  Current Outpatient Medications  Medication Sig Dispense Refill   acetaminophen (TYLENOL) 325 MG tablet Take 650 mg by mouth every 6 (six) hours as needed for mild pain.     ADVAIR DISKUS 250-50 MCG/DOSE AEPB Inhale 1 puff into the lungs 2 (two) times daily.     albuterol (VENTOLIN HFA) 108 (90 Base) MCG/ACT inhaler INL 2 PFS PO Q 4 H PRN     fexofenadine (ALLEGRA) 60 MG tablet Take 60 mg by mouth daily.     pantoprazole (PROTONIX) 40 MG tablet TAKE 1 TABLET(40 MG) BY MOUTH TWICE DAILY 60 tablet 4   clonazePAM (KLONOPIN) 0.5 MG tablet TAKE 1 TABLET BY MOUTH EVERY MORNING  AND 3 TABLETS EVERY NIGHT AT BEDTIME 360 tablet 1   LORazepam (ATIVAN) 1 MG tablet Take 1 tablet (1 mg total) by mouth every 8 (eight) hours as needed. 30 tablet 1   sertraline (ZOLOFT) 100 MG tablet Take 2 tablets (200 mg total) by mouth daily. 180 tablet 3   No current facility-administered medications for this visit.    Medication Side Effects: None no sleepiness  Allergies: No Known Allergies  Past  Medical History:  Diagnosis Date   Anxiety    Asthma    uses inhaler   Gallstones    Pancreatitis     Family History  Problem Relation Age of Onset   Dementia Mother     Social History   Socioeconomic History   Marital status: Married    Spouse name: Not on file   Number of children: Not on file   Years of education: Not on file   Highest education level: Not on file  Occupational History   Not on file  Tobacco Use   Smoking status: Never   Smokeless tobacco: Never  Substance and Sexual Activity   Alcohol use: Yes    Alcohol/week: 1.0 standard drink    Types: 1 Glasses of wine per week   Drug use: No   Sexual activity: Yes  Other Topics Concern   Not on file  Social History Narrative   Not on file   Social Determinants of Health   Financial Resource Strain: Not on file  Food Insecurity: Not on file  Transportation Needs: Not on file  Physical Activity: Not on file  Stress: Not on file  Social Connections: Not on file  Intimate Partner Violence: Not on file    Past Medical History, Surgical history, Social history, and Family history were reviewed and updated as appropriate.   Please see review of systems for further details on the patient's review from today.   Objective:   Physical Exam:  There were no vitals taken for this visit.  Physical Exam Constitutional:      General: He is not in acute distress. Musculoskeletal:        General: No deformity.  Neurological:     Mental Status: He is alert and oriented to person, place, and time.     Cranial Nerves: No dysarthria.     Coordination: Coordination normal.  Psychiatric:        Attention and Perception: Attention and perception normal. He does not perceive auditory or visual hallucinations.        Mood and Affect: Mood is anxious. Mood is not depressed. Affect is not labile, blunt, angry or inappropriate.        Speech: Speech normal. Speech is not slurred.        Behavior: Behavior normal.  Behavior is cooperative.        Thought Content: Thought content normal. Thought content is not paranoid or delusional. Thought content does not include homicidal or suicidal ideation. Thought content does not include homicidal or suicidal plan.        Cognition and Memory: Cognition and memory normal.        Judgment: Judgment normal.     Comments: Insight good. Talkative and pleasant Residual anxiety    Lab Review:     Component Value Date/Time   NA 141 07/08/2019 0735   K 4.1 07/08/2019 0735   CL 102 07/08/2019 0735   CO2 30 07/08/2019 0735   GLUCOSE 90 07/08/2019 0735   BUN 19 07/08/2019  0735   CREATININE 1.04 07/08/2019 0735   CALCIUM 10.0 07/08/2019 0735   PROT 7.6 07/08/2019 0735   ALBUMIN 4.5 07/08/2019 0735   AST 22 07/08/2019 0735   ALT 25 07/08/2019 0735   ALKPHOS 50 07/08/2019 0735   BILITOT 0.8 07/08/2019 0735   GFRNONAA >60 11/03/2017 0437   GFRAA >60 11/03/2017 0437       Component Value Date/Time   WBC 5.5 07/08/2019 0735   RBC 4.88 07/08/2019 0735   HGB 16.4 07/08/2019 0735   HCT 48.6 07/08/2019 0735   PLT 207.0 07/08/2019 0735   MCV 99.7 07/08/2019 0735   MCH 28.0 11/03/2017 0437   MCHC 33.8 07/08/2019 0735   RDW 12.6 07/08/2019 0735   LYMPHSABS 1.5 11/01/2017 0048   MONOABS 1.2 (H) 11/01/2017 0048   EOSABS 0.1 11/01/2017 0048   BASOSABS 0.0 11/01/2017 0048    No results found for: POCLITH, LITHIUM   No results found for: PHENYTOIN, PHENOBARB, VALPROATE, CBMZ   .res Assessment: Plan:    Panic disorder with agoraphobia - Plan: clonazePAM (KLONOPIN) 0.5 MG tablet, sertraline (ZOLOFT) 100 MG tablet, LORazepam (ATIVAN) 1 MG tablet  Social anxiety disorder - Plan: sertraline (ZOLOFT) 100 MG tablet  Generalized anxiety disorder - Plan: sertraline (ZOLOFT) 100 MG tablet   Greater than 50% of 20 min face to face time with patient was spent on counseling and coordination of care. We discussed Disc "pre-panic" which he manages without full panic.   He still worries somatically sometimes but not going excessively to the doctor.  Overall well controlled anxiety with residual sx.  Disc the difference between Ativan used prn for panic esp with travel and clonazepam regularly for prevention of panic.   Tolerating well..  Disc value of routine.    Answered questions about dreaming.  Disc writing around dreams.  Good response to meds.  No med changes  We discussed the short-term risks associated with benzodiazepines including sedation and increased fall risk among others.  Discussed long-term side effect risk including dependence, potential withdrawal symptoms, and the potential eventual dose-related risk of dementia.  Has been to doctor recently for checkup and doing well.  Got Covid vaccine  This appointment was 15 minutes  FU 9-12 mos  Meredith Staggers, MD, DFAPA    Please see After Visit Summary for patient specific instructions.  No future appointments.  No orders of the defined types were placed in this encounter.     -------------------------------

## 2021-09-21 ENCOUNTER — Other Ambulatory Visit: Payer: Self-pay | Admitting: Psychiatry

## 2021-09-21 DIAGNOSIS — F4001 Agoraphobia with panic disorder: Secondary | ICD-10-CM

## 2021-10-20 ENCOUNTER — Other Ambulatory Visit: Payer: Self-pay | Admitting: Gastroenterology

## 2022-01-18 ENCOUNTER — Other Ambulatory Visit: Payer: Self-pay | Admitting: Gastroenterology

## 2022-02-09 ENCOUNTER — Other Ambulatory Visit: Payer: Self-pay | Admitting: Gastroenterology

## 2022-02-16 ENCOUNTER — Telehealth: Payer: Self-pay | Admitting: Family Medicine

## 2022-02-16 NOTE — Telephone Encounter (Signed)
Pt is calling to est with dr Caryl Never as new pt. Pt said md see he would see him. Please advise ?

## 2022-02-21 NOTE — Telephone Encounter (Signed)
LVM for pt to call to schedule a New Pt appt with Burchette. ?

## 2022-03-01 NOTE — Telephone Encounter (Signed)
Pt has been sch for June 11-2021

## 2022-03-09 ENCOUNTER — Encounter: Payer: Self-pay | Admitting: Family Medicine

## 2022-03-09 ENCOUNTER — Ambulatory Visit: Payer: Medicare PPO | Admitting: Family Medicine

## 2022-03-09 VITALS — BP 118/80 | HR 67 | Temp 97.7°F | Ht 71.85 in | Wt 201.8 lb

## 2022-03-09 DIAGNOSIS — F411 Generalized anxiety disorder: Secondary | ICD-10-CM

## 2022-03-09 DIAGNOSIS — K219 Gastro-esophageal reflux disease without esophagitis: Secondary | ICD-10-CM

## 2022-03-09 DIAGNOSIS — J4520 Mild intermittent asthma, uncomplicated: Secondary | ICD-10-CM | POA: Diagnosis not present

## 2022-03-09 MED ORDER — ALBUTEROL SULFATE HFA 108 (90 BASE) MCG/ACT IN AERS: INHALATION_SPRAY | RESPIRATORY_TRACT | 2 refills | Status: DC

## 2022-03-09 NOTE — Progress Notes (Unsigned)
New Patient Office Visit  Subjective    Patient ID: [name], male    DOB: 25-May-1956  Age: 66 y.o. MRN: [id number]  CC:  Chief Complaint  Patient presents with   New Patient (Initial Visit)    HPI SHENANDOAH NORDLAND presents to establish care.  Previously seen at [facility] primary care.  Past medical history reviewed.  He had severe case of gallstone pancreatitis complicated by what sounds like pancreatic pseudocyst following that and states he lost down to below 100 pounds body weight back in 2018.  Is back fully recover now.  Followed closely by GI.  He does have history of peptic ulcer apparently after taking high-dose nonsteroidals for some time.  He is on chronic Protonix.  He has chronic anxiety disorder with reported generalized anxiety disorder and chronic insomnia.  Followed by psychiatry and is maintained on sertraline and Klonopin.  Does have history of reported asthma which sounds more like mild intermittent currently.  Only uses albuterol rescue inhaler preexercise and occasionally few times per month.  Had been on Advair in the past.  Denies any daily or regular wheezing.  Very active.  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  Ran track in college.  Apparently has not had a complete physical in some time.  Outpatient Encounter Medications as of 03/09/2022  Medication Sig   acetaminophen (TYLENOL) 325 MG tablet Take 650 mg by mouth every 6 (six) hours as needed for mild pain.   CALCIUM PO Take by mouth.   clonazePAM (KLONOPIN) 0.5 MG tablet TAKE 1 TABLET BY MOUTH EVERY MORNING AND 3 TABLETS EVERY NIGHT AT BEDTIME   fexofenadine (ALLEGRA) 60 MG tablet Take 60 mg by mouth daily.   Flaxseed, Linseed, (FLAXSEED OIL PO) Take by mouth.   LORazepam (ATIVAN) 1 MG tablet Take 1 tablet (1 mg total) by mouth every 8 (eight) hours as needed.   MAGNESIUM PO Take by mouth.   Multiple Vitamin (MULTI VITAMIN DAILY PO) Take by mouth.   Multiple  Vitamins-Minerals (ZINC PO) Take by mouth.   Omega-3 Fatty Acids (OMEGA-3 FISH OIL PO) Take by mouth.   pantoprazole (PROTONIX) 40 MG tablet TAKE 1 TABLET(40 MG) BY MOUTH TWICE DAILY   sertraline (ZOLOFT) 100 MG tablet Take 2 tablets (200 mg total) by mouth daily.   VITAMIN E PO Take by mouth.   [DISCONTINUED] ADVAIR DISKUS 250-50 MCG/DOSE AEPB Inhale 1 puff into the lungs 2 (two) times daily.   [DISCONTINUED] albuterol (VENTOLIN HFA) 108 (90 Base) MCG/ACT inhaler INL 2 PFS PO Q 4 H PRN   albuterol (VENTOLIN HFA) 108 (90 Base) MCG/ACT inhaler INL 2 PFS PO Q 4 H PRN   No facility-administered encounter medications on file as of 03/09/2022.    Past Medical History:  Diagnosis Date   Anxiety    Asthma    uses inhaler   Gallstones    Pancreatitis     Past Surgical History:  Procedure Laterality Date   CHOLECYSTECTOMY N/A 12/13/2016   Procedure: LAPAROSCOPIC CHOLECYSTECTOMY WITH INTRAOPERATIVE CHOLANGIOGRAM;  Surgeon: Chevis Pretty III, MD;  Location: [facility];  Service: General;  Laterality: N/A;   GUM SURGERY     IR CATHETER TUBE CHANGE  02/27/2017   IR RADIOLOGIST EVAL & MGMT  02/13/2017   IR RADIOLOGIST EVAL & MGMT  03/12/2017    Family History  Problem Relation Age of Onset   Arthritis Mother    Hyperlipidemia Mother  Dementia Mother    Mental illness Mother    Hyperlipidemia Father    Hearing loss Father    Hyperlipidemia Sister    Mental illness Brother    Intellectual disability Brother    Depression Brother    COPD Brother    Arthritis Brother    Cancer Brother    Mental illness Brother    Depression Brother    Alcohol abuse Brother     Social History   Socioeconomic History   Marital status: Married    Spouse name: Not on file   Number of children: Not on file   Years of education: Not on file   Highest education level: Not on file  Occupational History   Not on file  Tobacco Use   Smoking status: Never   Smokeless tobacco: Never  Substance and Sexual Activity    Alcohol use: Yes    Alcohol/week: 1.0 standard drink    Types: 1 Glasses of wine per week   Drug use: No   Sexual activity: Yes  Other Topics Concern   Not on file  Social History Narrative   Not on file   Social Determinants of Health   Financial Resource Strain: Not on file  Food Insecurity: Not on file  Transportation Needs: Not on file  Physical Activity: Not on file  Stress: Not on file  Social Connections: Not on file  Intimate Partner Violence: Not on file    Review of Systems  Constitutional:  Negative for chills, fever and weight loss.  Respiratory:  Negative for shortness of breath.   Cardiovascular:  Negative for chest pain.  Gastrointestinal:  Negative for abdominal pain.       Objective    BP 118/80 (BP Location: Left Arm, Patient Position: Sitting, Cuff Size: Normal)   Pulse 67   Temp 97.7 F (36.5 C) (Oral)   Ht 5' 11.85" (1.825 m)   Wt 201 lb 12.8 oz (91.5 kg)   SpO2 96%   BMI 27.48 kg/m   Physical Exam Constitutional:      Appearance: He is well-developed.  HENT:     Right Ear: External ear normal.     Left Ear: External ear normal.  Eyes:     Pupils: Pupils are equal, round, and reactive to light.  Neck:     Thyroid: No thyromegaly.  Cardiovascular:     Rate and Rhythm: Normal rate and regular rhythm.  Pulmonary:     Effort: Pulmonary effort is normal. No respiratory distress.     Breath sounds: Normal breath sounds. No wheezing or rales.  Musculoskeletal:     Cervical back: Neck supple.     Right lower leg: No edema.     Left lower leg: No edema.  Neurological:     Mental Status: He is alert.        Assessment & Plan:   Problem List Items Addressed This Visit       Unprioritized   Asthma in adult, mild intermittent, uncomplicated (Chronic)   Relevant Medications   albuterol (VENTOLIN HFA) 108 (90 Base) MCG/ACT inhaler   GAD (generalized anxiety disorder) - Primary   GERD (gastroesophageal reflux disease)  -Set up  complete physical -Refill albuterol for as needed use -Consider controller medication such as Advair for any more persistent wheezing -We discussed immunizations including Prevnar 20 at physical  No follow-ups on file.   Carolann Littler, MD

## 2022-03-09 NOTE — Patient Instructions (Addendum)
Let's set up complete physical.   We will plan to get full labs then including PSA.

## 2022-03-19 ENCOUNTER — Ambulatory Visit (INDEPENDENT_AMBULATORY_CARE_PROVIDER_SITE_OTHER): Payer: Medicare PPO | Admitting: Psychiatry

## 2022-03-19 ENCOUNTER — Encounter: Payer: Self-pay | Admitting: Psychiatry

## 2022-03-19 DIAGNOSIS — F4001 Agoraphobia with panic disorder: Secondary | ICD-10-CM | POA: Diagnosis not present

## 2022-03-19 DIAGNOSIS — F4010 Social phobia, unspecified: Secondary | ICD-10-CM

## 2022-03-19 DIAGNOSIS — F411 Generalized anxiety disorder: Secondary | ICD-10-CM

## 2022-03-19 DIAGNOSIS — N522 Drug-induced erectile dysfunction: Secondary | ICD-10-CM

## 2022-03-19 MED ORDER — CLONAZEPAM 0.5 MG PO TABS: ORAL_TABLET | ORAL | 1 refills | Status: DC

## 2022-03-19 MED ORDER — SERTRALINE HCL 100 MG PO TABS: 200.0000 mg | ORAL_TABLET | Freq: Every day | ORAL | 1 refills | Status: DC

## 2022-03-19 MED ORDER — LORAZEPAM 1 MG PO TABS: 1.0000 mg | ORAL_TABLET | Freq: Three times a day (TID) | ORAL | 1 refills | Status: DC | PRN

## 2022-03-19 MED ORDER — SILDENAFIL CITRATE 100 MG PO TABS: 100.0000 mg | ORAL_TABLET | Freq: Every day | ORAL | 3 refills | Status: AC | PRN

## 2022-03-19 NOTE — Progress Notes (Signed)
[name] MeekerDavid Alexander Gloria [id number] July 06, 1956 66 y.o.  Subjective:   Patient ID:  [name] is a 66 y.o. (DOB July 06, 1956) male.  Chief Complaint:  Chief Complaint  Patient presents with   Follow-up    Panic disorder with agoraphobia   Anxiety    Anxiety Patient reports no chest pain, confusion, decreased concentration, dizziness, nervous/anxious behavior, palpitations or suicidal ideas.    Medication Refill Pertinent negatives include no chest pain.   [name] Meekeravid Alexander [name] presents to the office today for follow-up of long hix panic and anxiety disorder.  visit June 2020.  No meds were changed. On clonazepam 0.5 mg 1 in Am and 1.5 mg HS and sertraline 200 mg and Ativan prn panic.  Overall good.  Covid has affected him and his son's [name] work at [employer] and has been successful and afraid over that.  Worry over finances is better.  Online courses and exercises daily.  Has a lot of interests.  Home projects.   Wife still working. Pleased with meds and wants refills.  Needs lorazepam prn for panic when he travels and that's rarely.  Needs clonazepam routinely to prevent panic and help sleep.  He realizes he's prone to having anxiety lead to somatic sx and fears. Plan: no med changes  09/22/20 appt with following noted: Overall still good with manageable anxiety.  Gets trip anxiety still but still goes when he can.  Health good.  Read and run and active at church and helps a lot. Still needs Ativan for travel but otherwise ok with clonazepam. Asks questions about dreaming that seemed intense. Plan no changes  06/21/21 appt noted: Good with church and travel. Still some latent anxiety, like when wife leaves on trip.  All the negativity in world is hard.  Reading more.  Real active at church. Using Ativan just prn travel. Patient reports stable mood and denies depressed or irritable moods.  Some anxiety residual and worries about refills. Occ has near panic sx with somatic sx of  dizziness and fear fainting and SOB.  Patient denies difficulty with sleep initiation or maintenance. Denies appetite disturbance.  Patient reports that energy and motivation have been good.  Patient denies any difficulty with concentration.  Patient denies any suicidal ideation. Plan: no med changes.  Continue sertraline 200 and clonazepam 0.5 mg every morning and 1.5 mg nightly  03/19/2022 appointment with the following noted: Takes lorazepam rarely for panic.  Really works. Doing well overall.  Travelling.  [place] Rare panic.  Most anxious right before he leaves.   Has ED and wants to resume Viagra prn.   Not depressed. Tolerating meds.  Christian and involved with church.   [place] in [place]  Past Psychiatric Medication Trials: Clonazepam, lorazepam,  buspirone, clonidine  sertraline, mirtazapine,  Under the care of this office since April 2001  Review of Systems:  Review of Systems  Cardiovascular:  Negative for chest pain and palpitations.  Neurological:  Negative for dizziness and tremors.  Psychiatric/Behavioral:  Negative for agitation, behavioral problems, confusion, decreased concentration, dysphoric mood, hallucinations, self-injury, sleep disturbance and suicidal ideas. The patient is not nervous/anxious and is not hyperactive.     Medications: I have reviewed the patient's current medications.  Current Outpatient Medications  Medication Sig Dispense Refill   acetaminophen (TYLENOL) 325 MG tablet Take 650 mg by mouth every 6 (six) hours as needed for mild pain.     albuterol (VENTOLIN HFA) 108 (90 Base) MCG/ACT inhaler INL 2  PFS PO Q 4 H PRN 18 g 2   CALCIUM PO Take by mouth.     fexofenadine (ALLEGRA) 60 MG tablet Take 60 mg by mouth daily.     Flaxseed, Linseed, (FLAXSEED OIL PO) Take by mouth.     MAGNESIUM PO Take by mouth.     Multiple Vitamin (MULTI VITAMIN DAILY PO) Take by mouth.     Multiple Vitamins-Minerals (ZINC PO) Take by  mouth.     Omega-3 Fatty Acids (OMEGA-3 FISH OIL PO) Take by mouth.     pantoprazole (PROTONIX) 40 MG tablet TAKE 1 TABLET(40 MG) BY MOUTH TWICE DAILY 60 tablet 4   sildenafil (VIAGRA) 100 MG tablet Take 1 tablet (100 mg total) by mouth daily as needed for erectile dysfunction. 30 tablet 3   VITAMIN E PO Take by mouth.     clonazePAM (KLONOPIN) 0.5 MG tablet TAKE 1 TABLET BY MOUTH EVERY MORNING AND 3 TABLETS EVERY NIGHT AT BEDTIME 360 tablet 1   LORazepam (ATIVAN) 1 MG tablet Take 1 tablet (1 mg total) by mouth every 8 (eight) hours as needed. 30 tablet 1   sertraline (ZOLOFT) 100 MG tablet Take 2 tablets (200 mg total) by mouth daily. 180 tablet 1   No current facility-administered medications for this visit.    Medication Side Effects: None no sleepiness  Allergies: No Known Allergies  Past Medical History:  Diagnosis Date   Anxiety    Asthma    uses inhaler   Gallstones    Pancreatitis     Family History  Problem Relation Age of Onset   Arthritis Mother    Hyperlipidemia Mother    Dementia Mother    Mental illness Mother    Hyperlipidemia Father    Hearing loss Father    Hyperlipidemia Sister    Mental illness Brother    Intellectual disability Brother    Depression Brother    COPD Brother    Arthritis Brother    Cancer Brother    Mental illness Brother    Depression Brother    Alcohol abuse Brother     Social History   Socioeconomic History   Marital status: Married    Spouse name: Not on file   Number of children: Not on file   Years of education: Not on file   Highest education level: Not on file  Occupational History   Not on file  Tobacco Use   Smoking status: Never   Smokeless tobacco: Never  Substance and Sexual Activity   Alcohol use: Yes    Alcohol/week: 1.0 standard drink of alcohol    Types: 1 Glasses of wine per week   Drug use: No   Sexual activity: Yes  Other Topics Concern   Not on file  Social History Narrative   Not on file    Social Determinants of Health   Financial Resource Strain: Not on file  Food Insecurity: Not on file  Transportation Needs: Not on file  Physical Activity: Not on file  Stress: Not on file  Social Connections: Not on file  Intimate Partner Violence: Not on file    Past Medical History, Surgical history, Social history, and Family history were reviewed and updated as appropriate.   Please see review of systems for further details on the patient's review from today.   Objective:   Physical Exam:  There were no vitals taken for this visit.  Physical Exam Constitutional:      General: He is not in acute  distress. Musculoskeletal:        General: No deformity.  Neurological:     Mental Status: He is alert and oriented to person, place, and time.     Cranial Nerves: No dysarthria.     Coordination: Coordination normal.  Psychiatric:        Attention and Perception: Attention and perception normal. He does not perceive auditory or visual hallucinations.        Mood and Affect: Mood is anxious. Mood is not depressed. Affect is not labile, blunt, angry or inappropriate.        Speech: Speech normal. Speech is not slurred.        Behavior: Behavior normal. Behavior is cooperative.        Thought Content: Thought content normal. Thought content is not paranoid or delusional. Thought content does not include homicidal or suicidal ideation. Thought content does not include suicidal plan.        Cognition and Memory: Cognition and memory normal.        Judgment: Judgment normal.     Comments: Insight good. Talkative and pleasant Residual anxiety     Lab Review:     Component Value Date/Time   NA 141 07/08/2019 0735   K 4.1 07/08/2019 0735   CL 102 07/08/2019 0735   CO2 30 07/08/2019 0735   GLUCOSE 90 07/08/2019 0735   BUN 19 07/08/2019 0735   CREATININE 1.04 07/08/2019 0735   CALCIUM 10.0 07/08/2019 0735   PROT 7.6 07/08/2019 0735   ALBUMIN 4.5 07/08/2019 0735   AST 22  07/08/2019 0735   ALT 25 07/08/2019 0735   ALKPHOS 50 07/08/2019 0735   BILITOT 0.8 07/08/2019 0735   GFRNONAA >60 11/03/2017 0437   GFRAA >60 11/03/2017 0437       Component Value Date/Time   WBC 5.5 07/08/2019 0735   RBC 4.88 07/08/2019 0735   HGB 16.4 07/08/2019 0735   HCT 48.6 07/08/2019 0735   PLT 207.0 07/08/2019 0735   MCV 99.7 07/08/2019 0735   MCH 28.0 11/03/2017 0437   MCHC 33.8 07/08/2019 0735   RDW 12.6 07/08/2019 0735   LYMPHSABS 1.5 11/01/2017 0048   MONOABS 1.2 (H) 11/01/2017 0048   EOSABS 0.1 11/01/2017 0048   BASOSABS 0.0 11/01/2017 0048    No results found for: "POCLITH", "LITHIUM"   No results found for: "PHENYTOIN", "PHENOBARB", "VALPROATE", "CBMZ"   .res Assessment: Plan:    Panic disorder with agoraphobia - Plan: clonazePAM (KLONOPIN) 0.5 MG tablet, sertraline (ZOLOFT) 100 MG tablet, LORazepam (ATIVAN) 1 MG tablet  Social anxiety disorder - Plan: sertraline (ZOLOFT) 100 MG tablet  Generalized anxiety disorder - Plan: sertraline (ZOLOFT) 100 MG tablet  Drug-induced erectile dysfunction - Plan: sildenafil (VIAGRA) 100 MG tablet   Greater than 50% of 20 min face to face time with patient was spent on counseling and coordination of care. We discussed Disc "pre-panic" which he manages without full panic.  He still worries somatically sometimes but not going excessively to the doctor.  Overall well controlled anxiety with residual sx.  Disc the difference between Ativan used prn for panic esp with travel and clonazepam regularly for prevention of panic.   Tolerating well..  Disc value of routine.    Answered questions about dreaming.  Disc writing around dreams.  Good response to meds.  No med changes  We discussed the short-term risks associated with benzodiazepines including sedation and increased fall risk among others.  Discussed long-term side effect risk including dependence, potential  withdrawal symptoms, and the potential eventual dose-related  risk of dementia.  But recent studies from 2020 dispute this association between benzodiazepines and dementia risk. Newer studies in 2020 do not support an association with dementia.   Has been to doctor recently for checkup and doing well.  Disc ED and SE viagra.  Wants to get it again.  FU 6-9 mos  Meredith Staggers, MD, DFAPA    Please see After Visit Summary for patient specific instructions.  Future Appointments  Date Time Provider Department Center  03/21/2022  7:30 AM Burchette, Elberta Fortis, MD [id number]    No orders of the defined types were placed in this encounter.     -------------------------------

## 2022-03-21 ENCOUNTER — Ambulatory Visit (INDEPENDENT_AMBULATORY_CARE_PROVIDER_SITE_OTHER): Payer: Medicare PPO | Admitting: Family Medicine

## 2022-03-21 ENCOUNTER — Encounter: Payer: Self-pay | Admitting: Family Medicine

## 2022-03-21 VITALS — BP 124/80 | HR 60 | Temp 97.5°F | Ht 72.84 in | Wt 204.3 lb

## 2022-03-21 DIAGNOSIS — Z125 Encounter for screening for malignant neoplasm of prostate: Secondary | ICD-10-CM

## 2022-03-21 DIAGNOSIS — Z0000 Encounter for general adult medical examination without abnormal findings: Secondary | ICD-10-CM | POA: Diagnosis not present

## 2022-03-21 LAB — CBC WITH DIFFERENTIAL/PLATELET
Basophils Absolute: 0 10*3/uL (ref 0.0–0.1)
Basophils Relative: 0.4 % (ref 0.0–3.0)
Eosinophils Absolute: 0.3 10*3/uL (ref 0.0–0.7)
Eosinophils Relative: 6.1 % — ABNORMAL HIGH (ref 0.0–5.0)
HCT: 46.3 % (ref 39.0–52.0)
Hemoglobin: 15.6 g/dL (ref 13.0–17.0)
Lymphocytes Relative: 28.3 % (ref 12.0–46.0)
Lymphs Abs: 1.2 10*3/uL (ref 0.7–4.0)
MCHC: 33.7 g/dL (ref 30.0–36.0)
MCV: 97.3 fl (ref 78.0–100.0)
Monocytes Absolute: 0.5 10*3/uL (ref 0.1–1.0)
Monocytes Relative: 11.2 % (ref 3.0–12.0)
Neutro Abs: 2.3 10*3/uL (ref 1.4–7.7)
Neutrophils Relative %: 54 % (ref 43.0–77.0)
Platelets: 206 10*3/uL (ref 150.0–400.0)
RBC: 4.77 Mil/uL (ref 4.22–5.81)
RDW: 13.3 % (ref 11.5–15.5)
WBC: 4.3 10*3/uL (ref 4.0–10.5)

## 2022-03-21 LAB — BASIC METABOLIC PANEL
BUN: 16 mg/dL (ref 6–23)
CO2: 29 mEq/L (ref 19–32)
Calcium: 9.2 mg/dL (ref 8.4–10.5)
Chloride: 103 mEq/L (ref 96–112)
Creatinine, Ser: 0.9 mg/dL (ref 0.40–1.50)
GFR: 88.14 mL/min (ref >60.00)
Glucose, Bld: 94 mg/dL (ref 70–99)
Potassium: 4.1 mEq/L (ref 3.5–5.1)
Sodium: 139 mEq/L (ref 135–145)

## 2022-03-21 LAB — LIPID PANEL
Cholesterol: 174 mg/dL (ref 0–200)
HDL: 44.9 mg/dL (ref >39.00)
LDL Cholesterol: 104 mg/dL — ABNORMAL HIGH (ref 0–99)
NonHDL: 129.28
Total CHOL/HDL Ratio: 4
Triglycerides: 124 mg/dL (ref 0.0–149.0)
VLDL: 24.8 mg/dL (ref 0.0–40.0)

## 2022-03-21 LAB — PSA, MEDICARE: PSA: 1.6 ng/ml (ref 0.10–4.00)

## 2022-03-21 LAB — HEPATIC FUNCTION PANEL
ALT: 14 U/L (ref 0–53)
AST: 15 U/L (ref 0–37)
Albumin: 4.2 g/dL (ref 3.5–5.2)
Alkaline Phosphatase: 56 U/L (ref 39–117)
Bilirubin, Direct: 0.1 mg/dL (ref 0.0–0.3)
Total Bilirubin: 0.8 mg/dL (ref 0.2–1.2)
Total Protein: 6.9 g/dL (ref 6.0–8.3)

## 2022-03-21 LAB — TSH: TSH: 3.41 u[IU]/mL (ref 0.35–5.50)

## 2022-03-21 NOTE — Progress Notes (Signed)
Established Patient Office Visit  Subjective   Patient ID: [name], male    DOB: July 25, 1956  Age: 66 y.o. MRN: [id number]  Chief Complaint  Patient presents with   Annual Exam    HPI   [name] is here for complete physical.  Just recently transferred care.  Generally very healthy.  He has history of GERD controlled with Protonix.  History of severe pancreatitis 2018 and has recovered from that.  Still followed by GI.  He retired about 7 years ago.  His pancreatitis episode was 5 years ago.  He competes actively in track and field and exercises daily.  Very health-conscious.  Health maintenance reviewed.  -No history of Shingrix vaccine -No known history of pneumonia vaccine -Last tetanus unknown -Colonoscopy due in about 5 years  Family history-mother had dementia and this was late onset dementia in her late 54s.  She died in her 25s.  Father died age 68 from complications of pelvic fracture.  He had a brother that died of possibly complications of dementia late 33s.  Another brother died age 25 of esophageal cancer  Social history-retired 7 years ago.  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  Never smoked.  Infrequent alcohol use.  Trains regularly for track and field events  Past Medical History:  Diagnosis Date   Anxiety    Asthma    uses inhaler   Gallstones    Pancreatitis    Past Surgical History:  Procedure Laterality Date   CHOLECYSTECTOMY N/A 12/13/2016   Procedure: LAPAROSCOPIC CHOLECYSTECTOMY WITH INTRAOPERATIVE CHOLANGIOGRAM;  Surgeon: Chevis Pretty III, MD;  Location: [facility];  Service: General;  Laterality: N/A;   GUM SURGERY     IR CATHETER TUBE CHANGE  02/27/2017   IR RADIOLOGIST EVAL & MGMT  02/13/2017   IR RADIOLOGIST EVAL & MGMT  03/12/2017    reports that he has never smoked. He has never used smokeless tobacco. He reports current alcohol use of about 1.0 standard drink of alcohol per week. He reports that he does not use drugs. family  history includes Alcohol abuse in his brother; Arthritis in his brother and mother; COPD in his brother; Cancer in his brother; Dementia in his mother; Depression in his brother and brother; Hearing loss in his father; Hyperlipidemia in his father, mother, and sister; Intellectual disability in his brother; Mental illness in his brother, brother, and mother. No Known Allergies  Review of Systems  Constitutional:  Negative for chills, fever, malaise/fatigue and weight loss.  HENT:  Negative for hearing loss.   Eyes:  Negative for blurred vision and double vision.  Respiratory:  Negative for cough and shortness of breath.   Cardiovascular:  Negative for chest pain, palpitations and leg swelling.  Gastrointestinal:  Negative for abdominal pain, blood in stool, constipation and diarrhea.  Genitourinary:  Negative for dysuria.  Musculoskeletal:  Negative for back pain.  Skin:  Negative for rash.  Neurological:  Negative for dizziness, speech change, seizures, loss of consciousness and headaches.  Psychiatric/Behavioral:  Negative for depression.       Objective:     BP 124/80 (BP Location: Left Arm, Patient Position: Sitting, Cuff Size: Normal)   Pulse 60   Temp (!) 97.5 F (36.4 C) (Oral)   Ht 6' 0.84" (1.85 m)   Wt 204 lb 4.8 oz (92.7 kg)   SpO2 97%   BMI 27.08 kg/m    Physical Exam Constitutional:      General: He is  not in acute distress.    Appearance: He is well-developed.  HENT:     Head: Normocephalic and atraumatic.     Right Ear: External ear normal.     Left Ear: External ear normal.  Eyes:     Conjunctiva/sclera: Conjunctivae normal.     Pupils: Pupils are equal, round, and reactive to light.  Neck:     Thyroid: No thyromegaly.  Cardiovascular:     Rate and Rhythm: Normal rate and regular rhythm.     Heart sounds: Normal heart sounds. No murmur heard. Pulmonary:     Effort: No respiratory distress.     Breath sounds: No wheezing or rales.  Abdominal:      General: Bowel sounds are normal. There is no distension.     Palpations: Abdomen is soft. There is no mass.     Tenderness: There is no abdominal tenderness. There is no guarding or rebound.  Genitourinary:    Comments: Prostate is symmetric with no palpated nodules. Musculoskeletal:     Cervical back: Normal range of motion and neck supple.  Lymphadenopathy:     Cervical: No cervical adenopathy.  Skin:    Findings: No rash.  Neurological:     Mental Status: He is alert and oriented to person, place, and time.     Cranial Nerves: No cranial nerve deficit.      No results found for any visits on 03/21/22.    The ASCVD Risk score (Arnett DK, et al., 2019) failed to calculate for the following reasons:   Cannot find a previous HDL lab    Assessment & Plan:   Problem List Items Addressed This Visit   None Visit Diagnoses     Physical exam    -  Primary   Relevant Orders   Basic metabolic panel   CBC with Differential/Platelet   Lipid panel   TSH   Hepatic function panel   PSA, Medicare   Prostate cancer screening       Relevant Orders   PSA, Medicare     -Discussed Shingrix vaccine and he will consider -Discussed pneumonia vaccine.  Recommended Prevnar 20.  He will consider. -Confirm date of last tetanus -Obtain screening labs as above.  He does desire PSA after discussing risk and benefits of false negatives and false positives -Continue regular exercise habits -Consider annual flu vaccine  No follow-ups on file.    Evelena Peat, MD

## 2022-03-21 NOTE — Patient Instructions (Signed)
Consider Shingrix (shingles vaccine) and Prevnar 20 (pneumonia vaccine).

## 2022-04-11 ENCOUNTER — Other Ambulatory Visit: Payer: Self-pay | Admitting: Gastroenterology

## 2022-04-11 ENCOUNTER — Other Ambulatory Visit: Payer: Self-pay | Admitting: Psychiatry

## 2022-04-11 DIAGNOSIS — F4001 Agoraphobia with panic disorder: Secondary | ICD-10-CM

## 2022-04-12 ENCOUNTER — Telehealth: Payer: Self-pay | Admitting: Gastroenterology

## 2022-04-12 MED ORDER — PANTOPRAZOLE SODIUM 40 MG PO TBEC: 40.0000 mg | DELAYED_RELEASE_TABLET | Freq: Two times a day (BID) | ORAL | 2 refills | Status: DC

## 2022-04-12 NOTE — Telephone Encounter (Signed)
Inbound call from patient stating he needs a refill for protonix. Patient is scheduled for 8/29 at 11:30 and is requesting a call when medication has been sent to pharmacy. Please advise.

## 2022-04-12 NOTE — Telephone Encounter (Signed)
Patient aware that Rx for pantoprazole has been sent to [facility] pharmacy as requested.

## 2022-06-05 ENCOUNTER — Ambulatory Visit (INDEPENDENT_AMBULATORY_CARE_PROVIDER_SITE_OTHER): Payer: Medicare PPO | Admitting: Gastroenterology

## 2022-06-05 ENCOUNTER — Encounter: Payer: Self-pay | Admitting: Gastroenterology

## 2022-06-05 VITALS — BP 118/80 | HR 63 | Ht 74.0 in | Wt 204.4 lb

## 2022-06-05 DIAGNOSIS — K219 Gastro-esophageal reflux disease without esophagitis: Secondary | ICD-10-CM | POA: Diagnosis not present

## 2022-06-05 DIAGNOSIS — Z8719 Personal history of other diseases of the digestive system: Secondary | ICD-10-CM | POA: Diagnosis not present

## 2022-06-05 DIAGNOSIS — Z8711 Personal history of peptic ulcer disease: Secondary | ICD-10-CM | POA: Diagnosis not present

## 2022-06-05 MED ORDER — PANTOPRAZOLE SODIUM 40 MG PO TBEC: 40.0000 mg | DELAYED_RELEASE_TABLET | Freq: Two times a day (BID) | ORAL | 2 refills | Status: DC

## 2022-06-05 NOTE — Patient Instructions (Signed)
_______________________________________________________  If you are age 68 or older, your body mass index should be between 23-30. Your Body mass index is 26.24 kg/m. If this is out of the aforementioned range listed, please consider follow up with your Primary Care Provider.  If you are age 53 or younger, your body mass index should be between 19-25. Your Body mass index is 26.24 kg/m. If this is out of the aformentioned range listed, please consider follow up with your Primary Care Provider.   ________________________________________________________  The [facility] providers would like to encourage you to use [facility] to communicate with providers for non-urgent requests or questions.  Due to long hold times on the telephone, sending your provider a message by [facility] may be a faster and more efficient way to get a response.  Please allow 48 business hours for a response.  Please remember that this is for non-urgent requests.  _______________________________________________________  [name] will be due for a recall colonoscopy in 2028. We will send you a reminder in the mail when it gets closer to that time.  Mychart message [place] after you tried the Protonix once a day.   Follow up in 1 year   It was a pleasure to see you today!  Thank you for trusting me with your gastrointestinal care!

## 2022-06-09 ENCOUNTER — Encounter: Payer: Self-pay | Admitting: Gastroenterology

## 2022-06-09 DIAGNOSIS — Z8711 Personal history of peptic ulcer disease: Secondary | ICD-10-CM | POA: Insufficient documentation

## 2022-06-09 NOTE — Progress Notes (Signed)
GASTROENTEROLOGY OUTPATIENT CLINIC VISIT   Primary Care Provider Kristian Covey, MD [address] [place] [address] [phone]  Patient Profile: [name] is a 66 y.o. male with a pmh significant for pancreatitis complicated by peripancreatic fluid collections requiring IR drainage (status post cholecystectomy but also had episode post cholecystectomy and not clear if this is idiopathic pancreatitis versus gallstone related), allergies, anxiety, asthma, GERD, history of PUD (duodenal ulcer requiring treatment), hemorrhoids.  The patient presents to the [facility] for an evaluation and management of problem(s) noted below:  Problem List 1. Gastroesophageal reflux disease without esophagitis   2. History of pancreatitis   3. History of peptic ulcer disease     History of Present Illness Please see prior notes for full details of HPI.  Interval History The patient returns for a follow-up.  I have not seen him for a few years.  He has been doing well.  His bowels are moving regularly.  His GERD symptoms are well controlled.  He is not having any abdominal pain or discomfort.  He had a single episode of significant nausea and vomiting after thankfully overeating [employer] chocolate cake but this has not caused any recurrence of symptoms since having that single episode.  He watches what he eats closely.  He is staying healthy and working out and will be in [place] in the coming months.  GI Review of Systems Positive as above Negative for dysphagia, odynophagia, nausea, vomiting, pain, alteration of bowel habits, melena, hematochezia   Review of Systems General: Denies fevers/chills/unintentional weight loss Cardiovascular: Denies chest pain/palpitations Pulmonary: Denies shortness of breath Gastroenterological: See HPI Genitourinary: Denies darkened urine Hematological: Denies easy bruising/bleeding Dermatological: Denies  jaundice Psychological: Mood is stable   Medications Current Outpatient Medications  Medication Sig Dispense Refill   acetaminophen (TYLENOL) 325 MG tablet Take 650 mg by mouth every 6 (six) hours as needed for mild pain.     albuterol (VENTOLIN HFA) 108 (90 Base) MCG/ACT inhaler INL 2 PFS PO Q 4 H PRN 18 g 2   CALCIUM PO Take by mouth.     clonazePAM (KLONOPIN) 0.5 MG tablet TAKE 1 TABLET BY MOUTH EVERY MORNING AND 3 TABLETS BY MOUTH EVERY NIGHT AT BEDTIME 360 tablet 1   fexofenadine (ALLEGRA) 60 MG tablet Take 60 mg by mouth daily.     Flaxseed, Linseed, (FLAXSEED OIL PO) Take by mouth.     LORazepam (ATIVAN) 1 MG tablet Take 1 tablet (1 mg total) by mouth every 8 (eight) hours as needed. 30 tablet 1   MAGNESIUM PO Take by mouth.     Multiple Vitamin (MULTI VITAMIN DAILY PO) Take by mouth.     Multiple Vitamins-Minerals (ZINC PO) Take by mouth.     Omega-3 Fatty Acids (OMEGA-3 FISH OIL PO) Take by mouth.     sertraline (ZOLOFT) 100 MG tablet Take 2 tablets (200 mg total) by mouth daily. 180 tablet 1   sildenafil (VIAGRA) 100 MG tablet Take 1 tablet (100 mg total) by mouth daily as needed for erectile dysfunction. 30 tablet 3   VITAMIN E PO Take by mouth.     pantoprazole (PROTONIX) 40 MG tablet Take 1 tablet (40 mg total) by mouth 2 (two) times daily. 180 tablet 2   No current facility-administered medications for this visit.    Allergies No Known Allergies  Histories Past Medical History:  Diagnosis Date   Anxiety    Asthma    uses inhaler  Gallstones    Pancreatitis    Past Surgical History:  Procedure Laterality Date   CHOLECYSTECTOMY N/A 12/13/2016   Procedure: LAPAROSCOPIC CHOLECYSTECTOMY WITH INTRAOPERATIVE CHOLANGIOGRAM;  Surgeon: Chevis Pretty III, MD;  Location: [facility];  Service: General;  Laterality: N/A;   GUM SURGERY     IR CATHETER TUBE CHANGE  02/27/2017   IR RADIOLOGIST EVAL & MGMT  02/13/2017   IR RADIOLOGIST EVAL & MGMT  03/12/2017   Social History    Socioeconomic History   Marital status: Married    Spouse name: Not on file   Number of children: Not on file   Years of education: Not on file   Highest education level: Not on file  Occupational History   Not on file  Tobacco Use   Smoking status: Never   Smokeless tobacco: Never  Substance and Sexual Activity   Alcohol use: Yes    Alcohol/week: 1.0 standard drink of alcohol    Types: 1 Glasses of wine per week   Drug use: No   Sexual activity: Yes  Other Topics Concern   Not on file  Social History Narrative   Not on file   Social Determinants of Health   Financial Resource Strain: Not on file  Food Insecurity: Not on file  Transportation Needs: Not on file  Physical Activity: Not on file  Stress: Not on file  Social Connections: Not on file  Intimate Partner Violence: Not on file   Family History  Problem Relation Age of Onset   Arthritis Mother    Hyperlipidemia Mother    Dementia Mother    Mental illness Mother    Hyperlipidemia Father    Hearing loss Father    Hyperlipidemia Sister    Mental illness Brother    Intellectual disability Brother    Depression Brother    COPD Brother    Arthritis Brother    Alzheimer's disease Brother    Cancer Brother    Mental illness Brother    Depression Brother    Alcohol abuse Brother    Colon polyps Neg Hx    Colon cancer Neg Hx    Stomach cancer Neg Hx    Esophageal cancer Neg Hx    I have reviewed his medical, social, and family history in detail and updated the electronic medical record as necessary.    PHYSICAL EXAMINATION  BP 118/80   Pulse 63   Ht 6\' 2"  (1.88 m)   Wt 204 lb 6.4 oz (92.7 kg)   SpO2 97%   BMI 26.24 kg/m  Wt Readings from Last 3 Encounters:  06/05/22 204 lb 6.4 oz (92.7 kg)  03/21/22 204 lb 4.8 oz (92.7 kg)  03/09/22 201 lb 12.8 oz (91.5 kg)  GEN: NAD, appears stated age, doesn't appear chronically ill PSYCH: Cooperative, without pressured speech EYE: Conjunctivae pink, sclerae  anicteric ENT: MMM CV: Nontachycardic RESP: No audible wheezing GI: NABS, soft, NT/ND, without rebound or guarding MSK/EXT: No lower extremity edema SKIN: No jaundice NEURO:  Alert & Oriented x 3, no focal deficits   REVIEW OF DATA  I reviewed the following data at the time of this encounter:  GI Procedures and Studies  No new relevant studies to review  Laboratory Studies  Reviewed those in epic  Imaging Studies  November 2020 CT abdomen pelvis with contrast (outside study) FINDINGS:  VISUALIZED LOWER THORAX:  No acute abnormalities. Cardiomegaly. Bibasilar atelectasis  SOLID VISCERA:  Liver: Normal.  Gallbladder: Gallbladder is surgically absent.  Pancreas:  Normal.  Adrenal glands: Normal.  Spleen: Normal.  Kidneys: Normal.  GI:  No bowel obstruction.  No focal bowel wall thickening or inflammatory changes.  Normal appendix.  PERITONEAL CAVITY/RETROPERITONEUM:  No free fluid.  No pneumoperitoneum.  No lymphadenopathy.  Aorta, IVC, iliac arteries, and major visceral arteries are grossly normal.  PELVIS:  No acute abnormalities.  MUSCULOSKELETAL:  No acute or destructive osseous processes.  MISC:  N/A. IMPRESSION:  1.  No acute abnormality in the abdomen or pelvis. No CT evidence of acute pancreatitis.    ASSESSMENT  Mr. Mandala is a 66 y.o. male  with a pmh significant for pancreatitis complicated by peripancreatic fluid collections requiring IR drainage (status post cholecystectomy but also had episode post cholecystectomy and not clear if this is idiopathic pancreatitis versus gallstone related), allergies, anxiety, asthma, GERD, history of PUD (duodenal ulcer requiring treatment), hemorrhoids.  The patient is seen today for evaluation and management of:  1. Gastroesophageal reflux disease without esophagitis   2. History of pancreatitis   3. History of peptic ulcer disease    The patient is clinically and hemodynamically stable.  He is continued to do well  since his issues years ago in regards to pancreatitis and postcholecystectomy issues.  He seems to be doing well on his current PPI therapy for his history of peptic ulcer disease.  He is not having any significant issues of bleeding and is doing well on his dietary and supplemental fiber.  He is due for colon cancer screening in 2028.  His last outside CT scan from 2020 showed no evidence of any pancreas issues persisting.  No repeat imaging unless the patient has significant issues develop again future.  We will see the patient in follow-up in a year.  All patient questions were answered to the best of my ability, and the patient agrees to the aforementioned plan of action with follow-up as indicated.   PLAN  Continue dietary fiber Stool softener/fiber as able Colonoscopy for repeat screening in 2028 PPI refills x13 months - May try to decrease PPI to once daily dosing to try to decrease his overall use if he is able to manage his symptoms he will update [place] via MyChart Follow-up in a year or sooner if issues develop  No orders of the defined types were placed in this encounter.   New Prescriptions   No medications on file   Modified Medications   Modified Medication Previous Medication   PANTOPRAZOLE (PROTONIX) 40 MG TABLET pantoprazole (PROTONIX) 40 MG tablet      Take 1 tablet (40 mg total) by mouth 2 (two) times daily.    Take 1 tablet (40 mg total) by mouth 2 (two) times daily.    Planned Follow Up No follow-ups on file.   Total Time in Face-to-Face and in Coordination of Care for patient including independent/personal interpretation/review of prior testing, medical history, examination, medication adjustment, communicating results with the patient directly, and documentation within the EHR is 25 minutes.Corliss Parish, MD [facility] Office # [phone]

## 2022-08-17 ENCOUNTER — Other Ambulatory Visit: Payer: Self-pay | Admitting: Psychiatry

## 2022-08-17 ENCOUNTER — Telehealth: Payer: Self-pay | Admitting: Family Medicine

## 2022-08-17 DIAGNOSIS — F4001 Agoraphobia with panic disorder: Secondary | ICD-10-CM

## 2022-08-17 DIAGNOSIS — F411 Generalized anxiety disorder: Secondary | ICD-10-CM

## 2022-08-17 DIAGNOSIS — F4010 Social phobia, unspecified: Secondary | ICD-10-CM

## 2022-08-17 NOTE — Telephone Encounter (Signed)
Returned patient's

## 2022-08-17 NOTE — Telephone Encounter (Signed)
Left message for patient to call back and schedule Medicare Annual Wellness Visit (AWV) either virtually or in office. Left  my jabber number [phone]    awvi 04/07/20 per [facility]  please schedule with Nurse Health Adviser   45 min for awv-i and in office appointments 30 min for awv-s  phone/virtual appointments  

## 2022-09-11 ENCOUNTER — Ambulatory Visit: Payer: Medicare PPO

## 2022-09-18 ENCOUNTER — Ambulatory Visit (INDEPENDENT_AMBULATORY_CARE_PROVIDER_SITE_OTHER): Payer: Medicare PPO | Admitting: Psychiatry

## 2022-09-18 ENCOUNTER — Encounter: Payer: Self-pay | Admitting: Psychiatry

## 2022-09-18 DIAGNOSIS — F411 Generalized anxiety disorder: Secondary | ICD-10-CM

## 2022-09-18 DIAGNOSIS — F4010 Social phobia, unspecified: Secondary | ICD-10-CM

## 2022-09-18 DIAGNOSIS — F4001 Agoraphobia with panic disorder: Secondary | ICD-10-CM

## 2022-09-18 MED ORDER — CLONAZEPAM 0.5 MG PO TABS: ORAL_TABLET | ORAL | 1 refills | Status: DC

## 2022-09-18 MED ORDER — SERTRALINE HCL 100 MG PO TABS: 200.0000 mg | ORAL_TABLET | Freq: Every day | ORAL | 1 refills | Status: DC

## 2022-09-18 MED ORDER — LORAZEPAM 1 MG PO TABS: 1.0000 mg | ORAL_TABLET | Freq: Three times a day (TID) | ORAL | 1 refills | Status: DC | PRN

## 2022-09-18 NOTE — Progress Notes (Signed)
[name] MeekerDavid Alexander [name] [id number] Sep 25, 1956 66 y.o.  Subjective:   Patient ID:  [name] MeekerDavid Alexander [name] is a 66 y.o. (DOB Sep 25, 1956) male.  Chief Complaint:  Chief Complaint  Patient presents with   Follow-up    Panic disorder with agoraphobia   Anxiety    Anxiety Patient reports no chest pain, confusion, decreased concentration, dizziness, nervous/anxious behavior, palpitations or suicidal ideas.    Medication Refill Pertinent negatives include no chest pain.   [name] presents to the office today for follow-up of long hix panic and anxiety disorder.  visit June 2020.  No meds were changed. On clonazepam 0.5 mg 1 in Am and 1.5 mg HS and sertraline 200 mg and Ativan prn panic.  Overall good.  Covid has affected him and his son's [name] work at [employer] and has been successful and afraid over that.  Worry over finances is better.  Online courses and exercises daily.  Has a lot of interests.  Home projects.   Wife still working. Pleased with meds and wants refills.  Needs lorazepam prn for panic when he travels and that's rarely.  Needs clonazepam routinely to prevent panic and help sleep.  He realizes he's prone to having anxiety lead to somatic sx and fears. Plan: no med changes  09/22/20 appt with following noted: Overall still good with manageable anxiety.  Gets trip anxiety still but still goes when he can.  Health good.  Read and run and active at church and helps a lot. Still needs Ativan for travel but otherwise ok with clonazepam. Asks questions about dreaming that seemed intense. Plan no changes  06/21/21 appt noted: Good with church and travel. Still some latent anxiety, like when wife leaves on trip.  All the negativity in world is hard.  Reading more.  Real active at church. Using Ativan just prn travel. Patient reports stable mood and denies depressed or irritable moods.  Some anxiety residual and worries about refills. Occ has near panic sx with somatic sx of  dizziness and fear fainting and SOB.  Patient denies difficulty with sleep initiation or maintenance. Denies appetite disturbance.  Patient reports that energy and motivation have been good.  Patient denies any difficulty with concentration.  Patient denies any suicidal ideation. Plan: no med changes.  Continue sertraline 200 and clonazepam 0.5 mg every morning and 1.5 mg nightly  03/19/2022 appointment with the following noted: Takes lorazepam rarely for panic.  Really works. Doing well overall.  Travelling.  [place] Rare panic.  Most anxious right before he leaves.   Has ED and wants to resume Viagra prn.   Not depressed. Tolerating meds.  09/18/22 appt noted: [name] [employer] [place].  He learned a lot and doing good training. Been on several places overseas.  [place] enjoyed [place]  Beautiful.  Perfect weather.Was not anxious even climbing steep mountain.  [place].   Panic is rare and managed.  No panic managed.  Some anxiety. Not depressed.    Active at church and trying to help others. Is nervous if doesn't have enough meds for trip.  Christian and involved with church.   [place]  Past Psychiatric Medication Trials: Clonazepam, lorazepam,  buspirone, clonidine  sertraline, mirtazapine,  Under the care of this office since April 2001  Review of Systems:  Review of Systems  Cardiovascular:  Negative for chest pain and palpitations.  Neurological:  Negative for dizziness and tremors.  Psychiatric/Behavioral:  Negative for agitation, behavioral  problems, confusion, decreased concentration, dysphoric mood, hallucinations, self-injury, sleep disturbance and suicidal ideas. The patient is not nervous/anxious and is not hyperactive.     Medications: I have reviewed the patient's current medications.  Current Outpatient Medications  Medication Sig Dispense Refill   acetaminophen (TYLENOL) 325 MG tablet Take 650 mg by mouth every 6  (six) hours as needed for mild pain.     albuterol (VENTOLIN HFA) 108 (90 Base) MCG/ACT inhaler INL 2 PFS PO Q 4 H PRN 18 g 2   CALCIUM PO Take by mouth.     fexofenadine (ALLEGRA) 60 MG tablet Take 60 mg by mouth daily.     Flaxseed, Linseed, (FLAXSEED OIL PO) Take by mouth.     MAGNESIUM PO Take by mouth.     Multiple Vitamin (MULTI VITAMIN DAILY PO) Take by mouth.     Multiple Vitamins-Minerals (ZINC PO) Take by mouth.     Omega-3 Fatty Acids (OMEGA-3 FISH OIL PO) Take by mouth.     pantoprazole (PROTONIX) 40 MG tablet Take 1 tablet (40 mg total) by mouth 2 (two) times daily. 180 tablet 2   sildenafil (VIAGRA) 100 MG tablet Take 1 tablet (100 mg total) by mouth daily as needed for erectile dysfunction. 30 tablet 3   VITAMIN E PO Take by mouth.     clonazePAM (KLONOPIN) 0.5 MG tablet TAKE 1 TABLET BY MOUTH EVERY MORNING AND 3 TABLETS BY MOUTH EVERY NIGHT AT BEDTIME 360 tablet 1   LORazepam (ATIVAN) 1 MG tablet Take 1 tablet (1 mg total) by mouth every 8 (eight) hours as needed. 30 tablet 1   sertraline (ZOLOFT) 100 MG tablet Take 2 tablets (200 mg total) by mouth daily. 180 tablet 1   No current facility-administered medications for this visit.    Medication Side Effects: None no sleepiness  Allergies: No Known Allergies  Past Medical History:  Diagnosis Date   Anxiety    Asthma    uses inhaler   Gallstones    Pancreatitis     Family History  Problem Relation Age of Onset   Arthritis Mother    Hyperlipidemia Mother    Dementia Mother    Mental illness Mother    Hyperlipidemia Father    Hearing loss Father    Hyperlipidemia Sister    Mental illness Brother    Intellectual disability Brother    Depression Brother    COPD Brother    Arthritis Brother    Alzheimer's disease Brother    Cancer Brother    Mental illness Brother    Depression Brother    Alcohol abuse Brother    Colon polyps Neg Hx    Colon cancer Neg Hx    Stomach cancer Neg Hx    Esophageal cancer Neg  Hx     Social History   Socioeconomic History   Marital status: Married    Spouse name: Not on file   Number of children: Not on file   Years of education: Not on file   Highest education level: Not on file  Occupational History   Not on file  Tobacco Use   Smoking status: Never   Smokeless tobacco: Never  Substance and Sexual Activity   Alcohol use: Yes    Alcohol/week: 1.0 standard drink of alcohol    Types: 1 Glasses of wine per week   Drug use: No   Sexual activity: Yes  Other Topics Concern   Not on file  Social History Narrative   Not  on file   Social Determinants of Health   Financial Resource Strain: Not on file  Food Insecurity: Not on file  Transportation Needs: Not on file  Physical Activity: Not on file  Stress: Not on file  Social Connections: Not on file  Intimate Partner Violence: Not on file    Past Medical History, Surgical history, Social history, and Family history were reviewed and updated as appropriate.   Please see review of systems for further details on the patient's review from today.   Objective:   Physical Exam:  There were no vitals taken for this visit.  Physical Exam Constitutional:      General: He is not in acute distress. Musculoskeletal:        General: No deformity.  Neurological:     Mental Status: He is alert and oriented to person, place, and time.     Cranial Nerves: No dysarthria.     Coordination: Coordination normal.  Psychiatric:        Attention and Perception: Attention and perception normal. He does not perceive auditory or visual hallucinations.        Mood and Affect: Mood is anxious. Mood is not depressed. Affect is not labile, blunt, angry or inappropriate.        Speech: Speech normal. Speech is not slurred.        Behavior: Behavior normal. Behavior is cooperative.        Thought Content: Thought content normal. Thought content is not paranoid or delusional. Thought content does not include homicidal or  suicidal ideation. Thought content does not include suicidal plan.        Cognition and Memory: Cognition and memory normal.        Judgment: Judgment normal.     Comments: Insight good. Talkative and pleasant Residual anxiety     Lab Review:     Component Value Date/Time   NA 139 03/21/2022 0810   K 4.1 03/21/2022 0810   CL 103 03/21/2022 0810   CO2 29 03/21/2022 0810   GLUCOSE 94 03/21/2022 0810   BUN 16 03/21/2022 0810   CREATININE 0.90 03/21/2022 0810   CALCIUM 9.2 03/21/2022 0810   PROT 6.9 03/21/2022 0810   ALBUMIN 4.2 03/21/2022 0810   AST 15 03/21/2022 0810   ALT 14 03/21/2022 0810   ALKPHOS 56 03/21/2022 0810   BILITOT 0.8 03/21/2022 0810   GFRNONAA >60 11/03/2017 0437   GFRAA >60 11/03/2017 0437       Component Value Date/Time   WBC 4.3 03/21/2022 0810   RBC 4.77 03/21/2022 0810   HGB 15.6 03/21/2022 0810   HCT 46.3 03/21/2022 0810   PLT 206.0 03/21/2022 0810   MCV 97.3 03/21/2022 0810   MCH 28.0 11/03/2017 0437   MCHC 33.7 03/21/2022 0810   RDW 13.3 03/21/2022 0810   LYMPHSABS 1.2 03/21/2022 0810   MONOABS 0.5 03/21/2022 0810   EOSABS 0.3 03/21/2022 0810   BASOSABS 0.0 03/21/2022 0810    No results found for: "POCLITH", "LITHIUM"   No results found for: "PHENYTOIN", "PHENOBARB", "VALPROATE", "CBMZ"   .res Assessment: Plan:    Panic disorder with agoraphobia - Plan: clonazePAM (KLONOPIN) 0.5 MG tablet, sertraline (ZOLOFT) 100 MG tablet, LORazepam (ATIVAN) 1 MG tablet  Generalized anxiety disorder - Plan: sertraline (ZOLOFT) 100 MG tablet  Social anxiety disorder - Plan: sertraline (ZOLOFT) 100 MG tablet   Greater than 50% of 20 min face to face time with patient was spent on counseling and coordination of care. We  discussed Disc "pre-panic" which he manages without full panic.  He still worries somatically sometimes but not going excessively to the doctor.  Overall well controlled anxiety with residual sx.  Disc the difference between Ativan  used prn for panic esp with travel and clonazepam regularly for prevention of panic.   Tolerating well..  Disc value of routine.    Encouraged continued social activity.  Good response to meds.  No med changes  We discussed the short-term risks associated with benzodiazepines including sedation and increased fall risk among others.  Discussed long-term side effect risk including dependence, potential withdrawal symptoms, and the potential eventual dose-related risk of dementia.  But recent studies from 2020 dispute this association between benzodiazepines and dementia risk. Newer studies in 2020 do not support an association with dementia. Rec gradually try to reduce HS dose and reasons for it.  Explaained ways to gradually taper.   Has been to doctor recently for checkup and doing well.  Disc ED and SE viagra.  Wants to get it again.  FU 6-9 mos  Meredith Staggers, MD, DFAPA    Please see After Visit Summary for patient specific instructions.  Future Appointments  Date Time Provider Department Center  10/05/2022 10:00 AM [facility]-NURSE HEALTH ADVISOR [facility]    No orders of the defined types were placed in this encounter.     -------------------------------

## 2022-10-05 ENCOUNTER — Ambulatory Visit: Payer: Medicare PPO

## 2022-10-18 ENCOUNTER — Telehealth: Payer: Medicare PPO | Admitting: Family Medicine

## 2022-10-18 DIAGNOSIS — Z91199 Patient's noncompliance with other medical treatment and regimen due to unspecified reason: Secondary | ICD-10-CM

## 2022-10-18 NOTE — Progress Notes (Signed)
Both nurse assistant and I tried to contact pt by phone and we were not able to reach. LM.

## 2022-10-19 ENCOUNTER — Telehealth: Payer: Self-pay | Admitting: Family Medicine

## 2022-10-19 NOTE — Telephone Encounter (Signed)
Pt is calling and per pt he called after hours line to cancel his AWV on 10-18-2022 and appt was not cancelled and he does not want to be charged no show fee

## 2022-10-19 NOTE — Telephone Encounter (Signed)
Noted  

## 2022-11-13 ENCOUNTER — Telehealth: Payer: Self-pay | Admitting: Family Medicine

## 2022-11-13 NOTE — Telephone Encounter (Signed)
Left message for patient to call back and schedule Medicare Annual Wellness Visit (AWV) either virtually or in office. Left  my Herbie Drape number [phone]   awvi 04/07/20 per [facility]  please schedule with Nurse Health Adviser   45 min for awv-i  in office appointments 30 min for awv-s & awv-i phone/virtual appointments

## 2023-02-11 ENCOUNTER — Telehealth: Payer: Self-pay | Admitting: Family Medicine

## 2023-02-11 NOTE — Telephone Encounter (Signed)
Called patient to schedule Medicare Annual Wellness Visit (AWV). Left message for patient to call back and schedule Medicare Annual Wellness Visit (AWV).  Last date of AWV: awvi 04/07/20 per [facility]   Please schedule an appointment at any time with [facility] beverlyor hannah kim.  If any questions, please contact me at [phone].  Thank you ,  Rudell Cobb AWV direct phone # [phone]

## 2023-03-20 ENCOUNTER — Encounter: Payer: Self-pay | Admitting: Psychiatry

## 2023-03-20 ENCOUNTER — Ambulatory Visit (INDEPENDENT_AMBULATORY_CARE_PROVIDER_SITE_OTHER): Payer: Medicare PPO | Admitting: Psychiatry

## 2023-03-20 DIAGNOSIS — F4001 Agoraphobia with panic disorder: Secondary | ICD-10-CM

## 2023-03-20 DIAGNOSIS — F411 Generalized anxiety disorder: Secondary | ICD-10-CM

## 2023-03-20 DIAGNOSIS — N522 Drug-induced erectile dysfunction: Secondary | ICD-10-CM | POA: Diagnosis not present

## 2023-03-20 DIAGNOSIS — F4010 Social phobia, unspecified: Secondary | ICD-10-CM

## 2023-03-20 MED ORDER — LORAZEPAM 1 MG PO TABS: 1.0000 mg | ORAL_TABLET | Freq: Three times a day (TID) | ORAL | 0 refills | Status: DC | PRN

## 2023-03-20 MED ORDER — CLONAZEPAM 0.5 MG PO TABS: ORAL_TABLET | ORAL | 1 refills | Status: DC

## 2023-03-20 MED ORDER — SERTRALINE HCL 100 MG PO TABS: 200.0000 mg | ORAL_TABLET | Freq: Every day | ORAL | 1 refills | Status: DC

## 2023-03-20 NOTE — Progress Notes (Signed)
[name] [id number] Jul 13, 1956 67 y.o.  Subjective:   Patient ID:  [name] is a 67 y.o. (DOB [birth year]) male.  Chief Complaint:  Chief Complaint  Patient presents with   Follow-up   Anxiety    Anxiety Symptoms include nervous/anxious behavior. Patient reports no chest pain, confusion, decreased concentration, dizziness, palpitations or suicidal ideas.    Medication Refill Pertinent negatives include no chest pain.   [name] presents to the office today for follow-up of long hix panic and anxiety disorder.  visit June 2020.  No meds were changed. On clonazepam 0.5 mg 1 in Am and 1.5 mg HS and sertraline 200 mg and Ativan prn panic.  Overall good.  Covid has affected him and his son's [name] work at [employer] and has been successful and afraid over that.  Worry over finances is better.  Online courses and exercises daily.  Has a lot of interests.  Home projects.   Wife still working. Pleased with meds and wants refills.  Needs lorazepam prn for panic when he travels and that's rarely.  Needs clonazepam routinely to prevent panic and help sleep.  He realizes he's prone to having anxiety lead to somatic sx and fears. Plan: no med changes  09/22/20 appt with following noted: Overall still good with manageable anxiety.  Gets trip anxiety still but still goes when he can.  Health good.  Read and run and active at church and helps a lot. Still needs Ativan for travel but otherwise ok with clonazepam. Asks questions about dreaming that seemed intense. Plan no changes  06/21/21 appt noted: Good with church and travel. Still some latent anxiety, like when wife leaves on trip.  All the negativity in world is hard.  Reading more.  Real active at church. Using Ativan just prn travel. Patient reports stable mood and denies depressed or irritable moods.  Some anxiety residual and worries about refills. Occ has near panic sx with somatic sx of dizziness and fear  fainting and SOB.  Patient denies difficulty with sleep initiation or maintenance. Denies appetite disturbance.  Patient reports that energy and motivation have been good.  Patient denies any difficulty with concentration.  Patient denies any suicidal ideation. Plan: no med changes.  Continue sertraline 200 and clonazepam 0.5 mg every morning and 1.5 mg nightly  03/19/2022 appointment with the following noted: Takes lorazepam rarely for panic.  Really works. Doing well overall.  Travelling.  Went to [employer]. Rare panic.  Most anxious right before he leaves.   Has ED and wants to resume Viagra prn.   Not depressed. Tolerating meds.  09/18/22 appt noted: [name] to [employer].  He learned a lot and doing good training. Been on several places overseas.  Really enjoyed [place].  Beautiful.  Perfect weather.Was not anxious even climbing steep mountain.  Was vvery beautiful.   Panic is rare and managed.  No panic managed.  Some anxiety. Not depressed.    Active at church and trying to help others. Is nervous if doesn't have enough meds for trip.  03/20/23 appt noted: Meds: Clonazepam 1.5 mg HS and 0.5 mg prn, sertraline 200 mg daily, lorazepam 1 mg prn flights. Doing well.  Still travelling.  Managed some anxiety.  Takes lorazepam to travel.  Can get anxiousl with triggers flying and travel away but he does both.  Anxiety driving highways and sometimes avoids.   Still exercising vigorously.   Prayer helps.   Good relationship with son.  Christian and involved with church.   Son in [place], [place]  Past Psychiatric Medication Trials: Clonazepam, lorazepam,   buspirone, clonidine  sertraline, mirtazapine,  Under the care of this office since April 2001  Review of Systems:  Review of Systems  Cardiovascular:  Negative for chest pain and palpitations.  Neurological:  Negative for dizziness and tremors.  Psychiatric/Behavioral:  Negative for agitation, behavioral problems,  confusion, decreased concentration, dysphoric mood, hallucinations, self-injury, sleep disturbance and suicidal ideas. The patient is nervous/anxious. The patient is not hyperactive.     Medications: I have reviewed the patient's current medications.  Current Outpatient Medications  Medication Sig Dispense Refill   acetaminophen (TYLENOL) 325 MG tablet Take 650 mg by mouth every 6 (six) hours as needed for mild pain.     albuterol (VENTOLIN HFA) 108 (90 Base) MCG/ACT inhaler INL 2 PFS PO Q 4 H PRN 18 g 2   CALCIUM PO Take by mouth.     fexofenadine (ALLEGRA) 60 MG tablet Take 60 mg by mouth daily.     Flaxseed, Linseed, (FLAXSEED OIL PO) Take by mouth.     MAGNESIUM PO Take by mouth.     Multiple Vitamin (MULTI VITAMIN DAILY PO) Take by mouth.     Multiple Vitamins-Minerals (ZINC PO) Take by mouth.     Omega-3 Fatty Acids (OMEGA-3 FISH OIL PO) Take by mouth.     pantoprazole (PROTONIX) 40 MG tablet Take 1 tablet (40 mg total) by mouth 2 (two) times daily. 180 tablet 2   sildenafil (VIAGRA) 100 MG tablet Take 1 tablet (100 mg total) by mouth daily as needed for erectile dysfunction. 30 tablet 3   VITAMIN E PO Take by mouth.     clonazePAM (KLONOPIN) 0.5 MG tablet TAKE 1 TABLET BY MOUTH EVERY MORNING AND 3 TABLETS BY MOUTH EVERY NIGHT AT BEDTIME 360 tablet 1   LORazepam (ATIVAN) 1 MG tablet Take 1 tablet (1 mg total) by mouth every 8 (eight) hours as needed. 30 tablet 0   sertraline (ZOLOFT) 100 MG tablet Take 2 tablets (200 mg total) by mouth daily. 180 tablet 1   No current facility-administered medications for this visit.    Medication Side Effects: None no sleepiness  Allergies: No Known Allergies  Past Medical History:  Diagnosis Date   Anxiety    Asthma    uses inhaler   Gallstones    Pancreatitis     Family History  Problem Relation Age of Onset   Arthritis Mother    Hyperlipidemia Mother    Dementia Mother    Mental illness Mother    Hyperlipidemia Father     Hearing loss Father    Hyperlipidemia Sister    Mental illness Brother    Intellectual disability Brother    Depression Brother    COPD Brother    Arthritis Brother    Alzheimer's disease Brother    Cancer Brother    Mental illness Brother    Depression Brother    Alcohol abuse Brother    Colon polyps Neg Hx    Colon cancer Neg Hx    Stomach cancer Neg Hx    Esophageal cancer Neg Hx     Social History   Socioeconomic History   Marital status: Married    Spouse name: Not on file   Number of children: Not on file   Years of education: Not on file   Highest education level: Not on file  Occupational History   Not on file  Tobacco Use   Smoking status: Never   Smokeless tobacco: Never  Substance and Sexual Activity   Alcohol use: Yes    Alcohol/week: 1.0 standard drink of alcohol    Types: 1 Glasses of wine per week   Drug use: No   Sexual activity: Yes  Other Topics Concern   Not on file  Social History Narrative   Not on file   Social Determinants of Health   Financial Resource Strain: Not on file  Food Insecurity: Not on file  Transportation Needs: Not on file  Physical Activity: Not on file  Stress: Not on file  Social Connections: Not on file  Intimate Partner Violence: Not on file    Past Medical History, Surgical history, Social history, and Family history were reviewed and updated as appropriate.   Please see review of systems for further details on the patient's review from today.   Objective:   Physical Exam:  There were no vitals taken for this visit.  Physical Exam Constitutional:      General: He is not in acute distress. Musculoskeletal:        General: No deformity.  Neurological:     Mental Status: He is alert and oriented to person, place, and time.     Cranial Nerves: No dysarthria.     Coordination: Coordination normal.  Psychiatric:        Attention and Perception: Attention and perception normal. He does not perceive auditory or  visual hallucinations.        Mood and Affect: Mood is anxious. Mood is not depressed. Affect is not labile, blunt, angry or inappropriate.        Speech: Speech normal. Speech is not slurred.        Behavior: Behavior normal. Behavior is cooperative.        Thought Content: Thought content normal. Thought content is not paranoid or delusional. Thought content does not include homicidal or suicidal ideation. Thought content does not include suicidal plan.        Cognition and Memory: Cognition and memory normal.        Judgment: Judgment normal.     Comments: Insight good. Talkative and pleasant Residual anxiety when triggered.     Lab Review:     Component Value Date/Time   NA 139 03/21/2022 0810   K 4.1 03/21/2022 0810   CL 103 03/21/2022 0810   CO2 29 03/21/2022 0810   GLUCOSE 94 03/21/2022 0810   BUN 16 03/21/2022 0810   CREATININE 0.90 03/21/2022 0810   CALCIUM 9.2 03/21/2022 0810   PROT 6.9 03/21/2022 0810   ALBUMIN 4.2 03/21/2022 0810   AST 15 03/21/2022 0810   ALT 14 03/21/2022 0810   ALKPHOS 56 03/21/2022 0810   BILITOT 0.8 03/21/2022 0810   GFRNONAA >60 11/03/2017 0437   GFRAA >60 11/03/2017 0437       Component Value Date/Time   WBC 4.3 03/21/2022 0810   RBC 4.77 03/21/2022 0810   HGB 15.6 03/21/2022 0810   HCT 46.3 03/21/2022 0810   PLT 206.0 03/21/2022 0810   MCV 97.3 03/21/2022 0810   MCH 28.0 11/03/2017 0437   MCHC 33.7 03/21/2022 0810   RDW 13.3 03/21/2022 0810   LYMPHSABS 1.2 03/21/2022 0810   MONOABS 0.5 03/21/2022 0810   EOSABS 0.3 03/21/2022 0810   BASOSABS 0.0 03/21/2022 0810    No results found for: "POCLITH", "LITHIUM"   No results found for: "PHENYTOIN", "PHENOBARB", "VALPROATE", "CBMZ"   .res Assessment: Plan:  Panic disorder with agoraphobia - Plan: sertraline (ZOLOFT) 100 MG tablet, clonazePAM (KLONOPIN) 0.5 MG tablet, LORazepam (ATIVAN) 1 MG tablet  Generalized anxiety disorder - Plan: sertraline (ZOLOFT) 100 MG  tablet  Social anxiety disorder - Plan: sertraline (ZOLOFT) 100 MG tablet  Drug-induced erectile dysfunction   20 min face to face time with patient was spent on counseling and coordination of care. We discussed Disc "pre-panic" which he manages without full panic.  He still worries somatically sometimes but not going excessively to the doctor.  Overall well controlled anxiety with residual sx. Push through avoidance.    Disc the difference between Ativan used prn for panic esp with travel and clonazepam regularly for prevention of panic.   Tolerating well..  Disc value of routine.    Encouraged continued social activity.  Good response to meds.  No med changes Meds: Clonazepam 1.5 mg HS and 0.5 mg prn, sertraline 200 mg daily, lorazepam 1 mg prn flights.  We discussed the short-term risks associated with benzodiazepines including sedation and increased fall risk among others.  Discussed long-term side effect risk including dependence, potential withdrawal symptoms, and the potential eventual dose-related risk of dementia.  But recent studies from 2020 dispute this association between benzodiazepines and dementia risk. Newer studies in 2020 do not support an association with dementia. Rec gradually try to reduce HS dose and reasons for it.  Explaained ways to gradually taper. Regular PCP and checkups  Disc ED and SE viagra.  Wants to get it prn.    FU 6-9 mos  Meredith Staggers, MD, DFAPA    Please see After Visit Summary for patient specific instructions.  No future appointments.   No orders of the defined types were placed in this encounter.     -------------------------------

## 2023-04-12 ENCOUNTER — Other Ambulatory Visit: Payer: Self-pay | Admitting: Psychiatry

## 2023-04-12 DIAGNOSIS — F4001 Agoraphobia with panic disorder: Secondary | ICD-10-CM

## 2023-04-13 ENCOUNTER — Other Ambulatory Visit: Payer: Self-pay | Admitting: Gastroenterology

## 2023-04-22 ENCOUNTER — Ambulatory Visit (INDEPENDENT_AMBULATORY_CARE_PROVIDER_SITE_OTHER): Payer: Medicare PPO | Admitting: Family Medicine

## 2023-04-22 VITALS — BP 110/64 | HR 65 | Temp 97.9°F | Ht 74.0 in | Wt 191.9 lb

## 2023-04-22 DIAGNOSIS — Z0000 Encounter for general adult medical examination without abnormal findings: Secondary | ICD-10-CM

## 2023-04-22 MED ORDER — ALBUTEROL SULFATE HFA 108 (90 BASE) MCG/ACT IN AERS: INHALATION_SPRAY | RESPIRATORY_TRACT | 2 refills | Status: DC

## 2023-04-22 NOTE — Patient Instructions (Signed)
Consider Shingrix, Tdap (tetanus), and Prevnar 20  Consider ear wax softener such as Debrox or Cerumenex followed by irrigation with luke warm water and rubber bulb syringe.

## 2023-04-22 NOTE — Progress Notes (Signed)
Established Patient Office Visit  Subjective   Patient ID: [name], male    DOB: 1956-07-23  Age: 67 y.o. MRN: [id number]  Chief Complaint  Patient presents with   Medicare Wellness         HPI   [name] is seen for physical exam.  He has history of GERD which is controlled with Protonix.  He had severe bout with pancreatitis 2018 which was life-threatening with extensive hospitalization at that time.  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  Does mostly sprints with anywhere from 60 m up to 400 m Currently feels well.  Has been reluctant to consider immunizations in the past.  Some intentional weight loss this year.    Health maintenance reviewed:  Health Maintenance  Topic Date Due   COVID-19 Vaccine (1) Never done   Hepatitis C Screening  Never done   DTaP/Tdap/Td (1 - Tdap) Never done   Zoster Vaccines- Shingrix (1 of 2) Never done   Medicare Annual Wellness (AWV)  12/29/2020   Pneumonia Vaccine 65+ Years old (1 of 1 - PCV) 04/21/2024 (Originally 05/01/2019)   INFLUENZA VACCINE  05/09/2023   Colonoscopy  06/05/2027   HPV VACCINES  Aged Out    Review of Systems  Constitutional:  Negative for chills, fever, malaise/fatigue and weight loss.  HENT:  Negative for hearing loss.   Eyes:  Negative for blurred vision and double vision.  Respiratory:  Negative for cough and shortness of breath.   Cardiovascular:  Negative for chest pain, palpitations and leg swelling.  Gastrointestinal:  Negative for abdominal pain, blood in stool, constipation and diarrhea.  Genitourinary:  Negative for dysuria.  Skin:  Negative for rash.  Neurological:  Negative for dizziness, speech change, seizures, loss of consciousness and headaches.  Psychiatric/Behavioral:  Negative for depression.       Objective:     BP 110/64 (BP Location: Left Arm, Patient Position: Sitting, Cuff Size: Normal)   Pulse 65   Temp 97.9 F (36.6 C) (Oral)   Ht 6\' 2"  (1.88 m)    Wt 191 lb 14.4 oz (87 kg)   SpO2 95%   BMI 24.64 kg/m  BP Readings from Last 3 Encounters:  04/22/23 110/64  06/05/22 118/80  03/21/22 124/80   Wt Readings from Last 3 Encounters:  04/22/23 191 lb 14.4 oz (87 kg)  06/05/22 204 lb 6.4 oz (92.7 kg)  03/21/22 204 lb 4.8 oz (92.7 kg)      Physical Exam Vitals reviewed.  Constitutional:      General: He is not in acute distress.    Appearance: He is well-developed.  HENT:     Head: Normocephalic and atraumatic.     Ears:     Comments: He has some cerumen impaction bilaterally Eyes:     Conjunctiva/sclera: Conjunctivae normal.     Pupils: Pupils are equal, round, and reactive to light.  Neck:     Thyroid: No thyromegaly.  Cardiovascular:     Rate and Rhythm: Normal rate and regular rhythm.     Heart sounds: Normal heart sounds. No murmur heard. Pulmonary:     Effort: No respiratory distress.     Breath sounds: No wheezing or rales.  Abdominal:     General: Bowel sounds are normal. There is no distension.     Palpations: Abdomen is soft. There is no mass.     Tenderness: There is no abdominal tenderness. There is no guarding or rebound.  Musculoskeletal:  Cervical back: Normal range of motion and neck supple.  Lymphadenopathy:     Cervical: No cervical adenopathy.  Skin:    Findings: No rash.     Comments: No concerning skin lesions.  He does have slightly scaly patch with whitish scale bridge of the nose on the right side.  No ulceration.  No nodular changes.  Neurological:     Mental Status: He is alert and oriented to person, place, and time.     Cranial Nerves: No cranial nerve deficit.      No results found for any visits on 04/22/23.    The 10-year ASCVD risk score (Arnett DK, et al., 2019) is: 12%    Assessment & Plan:   Problem List Items Addressed This Visit   None Visit Diagnoses     Physical exam    -  Primary   Relevant Orders   Basic metabolic panel   Lipid panel   CBC with  Differential/Platelet   TSH   Hepatic function panel   PSA, Medicare     Healthy 67 year old male with history of severe pancreatitis 6 years ago currently doing well.  He has GERD controlled with pantoprazole. -For cerumen impactions we suggested over-the-counter Debrox or Cerumenex followed by warm water irrigation with a remold syringe and irrigation here if not improving with home remedy -We discussed vaccines including Prevnar 20, Tdap, and Shingrix.  He is not willing to this time but will consider. -The natural history of prostate cancer and ongoing controversy regarding screening and potential treatment outcomes of prostate cancer has been discussed with the patient. The meaning of a false positive PSA and a false negative PSA has been discussed. He indicates understanding of the limitations of this screening test and wishes to proceed with screening PSA testing. -Repeat colonoscopy due 2028   No follow-ups on file.    Evelena Peat, MD

## 2023-05-17 ENCOUNTER — Encounter: Payer: Medicare PPO | Admitting: Family Medicine

## 2023-05-23 ENCOUNTER — Encounter (INDEPENDENT_AMBULATORY_CARE_PROVIDER_SITE_OTHER): Payer: Self-pay

## 2023-06-12 ENCOUNTER — Encounter: Payer: Self-pay | Admitting: Family Medicine

## 2023-06-12 ENCOUNTER — Ambulatory Visit (INDEPENDENT_AMBULATORY_CARE_PROVIDER_SITE_OTHER): Payer: Medicare PPO | Admitting: Family Medicine

## 2023-06-12 VITALS — BP 116/70 | HR 60 | Temp 98.0°F | Ht 73.23 in | Wt 192.8 lb

## 2023-06-12 DIAGNOSIS — Z0000 Encounter for general adult medical examination without abnormal findings: Secondary | ICD-10-CM | POA: Diagnosis not present

## 2023-06-12 DIAGNOSIS — S76311A Strain of muscle, fascia and tendon of the posterior muscle group at thigh level, right thigh, initial encounter: Secondary | ICD-10-CM | POA: Diagnosis not present

## 2023-06-12 LAB — CBC WITH DIFFERENTIAL/PLATELET
Basophils Absolute: 0 10*3/uL (ref 0.0–0.1)
Basophils Relative: 0.4 % (ref 0.0–3.0)
Eosinophils Absolute: 0.2 10*3/uL (ref 0.0–0.7)
Eosinophils Relative: 3.9 % (ref 0.0–5.0)
HCT: 48.8 % (ref 39.0–52.0)
Hemoglobin: 16.2 g/dL (ref 13.0–17.0)
Lymphocytes Relative: 24.1 % (ref 12.0–46.0)
Lymphs Abs: 1.2 10*3/uL (ref 0.7–4.0)
MCHC: 33.2 g/dL (ref 30.0–36.0)
MCV: 99.8 fl (ref 78.0–100.0)
Monocytes Absolute: 0.4 10*3/uL (ref 0.1–1.0)
Monocytes Relative: 8 % (ref 3.0–12.0)
Neutro Abs: 3.2 10*3/uL (ref 1.4–7.7)
Neutrophils Relative %: 63.6 % (ref 43.0–77.0)
Platelets: 192 10*3/uL (ref 150.0–400.0)
RBC: 4.89 Mil/uL (ref 4.22–5.81)
RDW: 12.9 % (ref 11.5–15.5)
WBC: 5 10*3/uL (ref 4.0–10.5)

## 2023-06-12 LAB — LIPID PANEL
Cholesterol: 181 mg/dL (ref 0–200)
HDL: 40.5 mg/dL (ref >39.00)
LDL Cholesterol: 105 mg/dL — ABNORMAL HIGH (ref 0–99)
NonHDL: 140.27
Total CHOL/HDL Ratio: 4
Triglycerides: 174 mg/dL — ABNORMAL HIGH (ref 0.0–149.0)
VLDL: 34.8 mg/dL (ref 0.0–40.0)

## 2023-06-12 LAB — BASIC METABOLIC PANEL
BUN: 18 mg/dL (ref 6–23)
CO2: 31 meq/L (ref 19–32)
Calcium: 9.8 mg/dL (ref 8.4–10.5)
Chloride: 102 meq/L (ref 96–112)
Creatinine, Ser: 1.01 mg/dL (ref 0.40–1.50)
GFR: 76.09 mL/min (ref >60.00)
Glucose, Bld: 94 mg/dL (ref 70–99)
Potassium: 4.6 meq/L (ref 3.5–5.1)
Sodium: 140 meq/L (ref 135–145)

## 2023-06-12 LAB — HEPATIC FUNCTION PANEL
ALT: 18 U/L (ref 0–53)
AST: 18 U/L (ref 0–37)
Albumin: 4.2 g/dL (ref 3.5–5.2)
Alkaline Phosphatase: 49 U/L (ref 39–117)
Bilirubin, Direct: 0.2 mg/dL (ref 0.0–0.3)
Total Bilirubin: 1 mg/dL (ref 0.2–1.2)
Total Protein: 7.1 g/dL (ref 6.0–8.3)

## 2023-06-12 LAB — PSA, MEDICARE: PSA: 1.38 ng/mL (ref 0.10–4.00)

## 2023-06-12 LAB — TSH: TSH: 1.56 u[IU]/mL (ref 0.35–5.50)

## 2023-06-12 NOTE — Patient Instructions (Signed)
Set up dermatology follow up regarding nose lesion  Consider Shingrix and Prevnar 20.

## 2023-06-12 NOTE — Progress Notes (Signed)
Established Patient Office Visit  Subjective   Patient ID: [name], male    DOB: [birth year]  Age: 67 y.o. MRN: [id number]  Chief Complaint  Patient presents with   Annual Exam    HPI   Patient was scheduled for a "annual exam ".  However, he was just seen in July for his yearly physical.  We had ordered future labs but he had never had them drawn that day.  He is requesting those today.  We reminded him about vaccines including flu vaccine, Shingrix, and Prevnar 20 but he declines all but is considering Shingrix and Prevnar.  He is having acute issues of right hamstring pain.  Works out regularly.  Was planning on running oral championships in [place] in 5 weeks 100 m, 200 m, and 400 m.  He has been limited in his speed work.  Has been all tolerate slower running and ran 4 and half miles yesterday.  Still doing resistance training as well.  Pain is mostly proximal hamstring region.  No visible swelling or bruising.  No lower extremity weakness or numbness.  Past Medical History:  Diagnosis Date   Anxiety    Asthma    uses inhaler   Gallstones    Pancreatitis    Past Surgical History:  Procedure Laterality Date   CHOLECYSTECTOMY N/A 12/13/2016   Procedure: LAPAROSCOPIC CHOLECYSTECTOMY WITH INTRAOPERATIVE CHOLANGIOGRAM;  Surgeon: Chevis Pretty III, MD;  Location: [facility];  Service: General;  Laterality: N/A;   GUM SURGERY     IR CATHETER TUBE CHANGE  02/27/2017   IR RADIOLOGIST EVAL & MGMT  02/13/2017   IR RADIOLOGIST EVAL & MGMT  03/12/2017    reports that he has never smoked. He has never used smokeless tobacco. He reports current alcohol use of about 1.0 standard drink of alcohol per week. He reports that he does not use drugs. family history includes Alcohol abuse in his brother; Alzheimer's disease in his brother; Arthritis in his brother and mother; COPD in his brother; Cancer in his brother; Dementia in his mother; Depression in his brother and brother; Hearing loss in his  father; Hyperlipidemia in his father, mother, and sister; Intellectual disability in his brother; Mental illness in his brother, brother, and mother. No Known Allergies  Review of Systems  Musculoskeletal:  Negative for back pain.  Neurological:  Negative for focal weakness.      Objective:     BP 116/70 (BP Location: Left Arm, Patient Position: Sitting, Cuff Size: Normal)   Pulse 60   Temp 98 F (36.7 C) (Oral)   Ht 6' 1.23" (1.86 m)   Wt 192 lb 12.8 oz (87.5 kg)   SpO2 98%   BMI 25.28 kg/m  BP Readings from Last 3 Encounters:  06/12/23 116/70  04/22/23 110/64  06/05/22 118/80   Wt Readings from Last 3 Encounters:  06/12/23 192 lb 12.8 oz (87.5 kg)  04/22/23 191 lb 14.4 oz (87 kg)  06/05/22 204 lb 6.4 oz (92.7 kg)      Physical Exam Vitals reviewed.  Constitutional:      General: He is not in acute distress.    Appearance: Normal appearance. He is not ill-appearing.  Cardiovascular:     Rate and Rhythm: Normal rate and regular rhythm.  Pulmonary:     Effort: Pulmonary effort is normal.     Breath sounds: Normal breath sounds.  Musculoskeletal:     Comments: Does have some right proximal hamstring pain with knee flexion against  resistance.  Excellent range of motion right hip.  No lateral hip tenderness.  Neurological:     Mental Status: He is alert.      No results found for any visits on 06/12/23.    The 10-year ASCVD risk score (Arnett DK, et al., 2019) is: 14.1%    Assessment & Plan:   Right hamstring strain.  Suspect muscular strain.  Discussed importance of gentle stretching.  Continue jogging and slow running until more fully recovered.  We did discuss possible sports medicine referral if not improving over the next couple weeks   No follow-ups on file.    Evelena Peat, MD

## 2023-06-13 ENCOUNTER — Telehealth: Payer: Self-pay | Admitting: Family Medicine

## 2023-06-13 NOTE — Telephone Encounter (Signed)
Pt came in yesterday for his CPE. Pt states he did not fast for his CPE, but had labs. Pt states he saw his results this morning and now has cholesterol concerns. Pt is wondering if he should do his labs again? Pt would like a call back for advice on what he should do. Pt was informed MD is [facility] on Thursdays.

## 2023-06-14 NOTE — Telephone Encounter (Signed)
Left detailed message on mobile voicemail informing the patient of message below and to call back with any concerns.

## 2023-06-25 DIAGNOSIS — M76899 Other specified enthesopathies of unspecified lower limb, excluding foot: Secondary | ICD-10-CM | POA: Diagnosis not present

## 2023-06-27 DIAGNOSIS — Z08 Encounter for follow-up examination after completed treatment for malignant neoplasm: Secondary | ICD-10-CM | POA: Diagnosis not present

## 2023-06-27 DIAGNOSIS — C44321 Squamous cell carcinoma of skin of nose: Secondary | ICD-10-CM | POA: Diagnosis not present

## 2023-06-27 DIAGNOSIS — L821 Other seborrheic keratosis: Secondary | ICD-10-CM | POA: Diagnosis not present

## 2023-06-27 DIAGNOSIS — C44622 Squamous cell carcinoma of skin of right upper limb, including shoulder: Secondary | ICD-10-CM | POA: Diagnosis not present

## 2023-06-27 DIAGNOSIS — D1801 Hemangioma of skin and subcutaneous tissue: Secondary | ICD-10-CM | POA: Diagnosis not present

## 2023-06-27 DIAGNOSIS — C4442 Squamous cell carcinoma of skin of scalp and neck: Secondary | ICD-10-CM | POA: Diagnosis not present

## 2023-06-27 DIAGNOSIS — D485 Neoplasm of uncertain behavior of skin: Secondary | ICD-10-CM | POA: Diagnosis not present

## 2023-06-27 DIAGNOSIS — L570 Actinic keratosis: Secondary | ICD-10-CM | POA: Diagnosis not present

## 2023-06-27 DIAGNOSIS — Z85828 Personal history of other malignant neoplasm of skin: Secondary | ICD-10-CM | POA: Diagnosis not present

## 2023-07-16 DIAGNOSIS — C44321 Squamous cell carcinoma of skin of nose: Secondary | ICD-10-CM | POA: Diagnosis not present

## 2023-07-22 DIAGNOSIS — C4442 Squamous cell carcinoma of skin of scalp and neck: Secondary | ICD-10-CM | POA: Diagnosis not present

## 2023-07-22 DIAGNOSIS — Z4802 Encounter for removal of sutures: Secondary | ICD-10-CM | POA: Diagnosis not present

## 2023-07-22 DIAGNOSIS — C44622 Squamous cell carcinoma of skin of right upper limb, including shoulder: Secondary | ICD-10-CM | POA: Diagnosis not present

## 2023-08-05 DIAGNOSIS — L578 Other skin changes due to chronic exposure to nonionizing radiation: Secondary | ICD-10-CM | POA: Diagnosis not present

## 2023-08-05 DIAGNOSIS — Z48817 Encounter for surgical aftercare following surgery on the skin and subcutaneous tissue: Secondary | ICD-10-CM | POA: Diagnosis not present

## 2023-08-05 DIAGNOSIS — L281 Prurigo nodularis: Secondary | ICD-10-CM | POA: Diagnosis not present

## 2023-08-05 DIAGNOSIS — D485 Neoplasm of uncertain behavior of skin: Secondary | ICD-10-CM | POA: Diagnosis not present

## 2023-08-09 DIAGNOSIS — M76899 Other specified enthesopathies of unspecified lower limb, excluding foot: Secondary | ICD-10-CM | POA: Diagnosis not present

## 2023-08-09 DIAGNOSIS — M6281 Muscle weakness (generalized): Secondary | ICD-10-CM | POA: Diagnosis not present

## 2023-08-09 DIAGNOSIS — M76891 Other specified enthesopathies of right lower limb, excluding foot: Secondary | ICD-10-CM | POA: Diagnosis not present

## 2023-09-19 DIAGNOSIS — M76899 Other specified enthesopathies of unspecified lower limb, excluding foot: Secondary | ICD-10-CM | POA: Diagnosis not present

## 2023-09-19 DIAGNOSIS — M76891 Other specified enthesopathies of right lower limb, excluding foot: Secondary | ICD-10-CM | POA: Diagnosis not present

## 2023-09-19 DIAGNOSIS — M6281 Muscle weakness (generalized): Secondary | ICD-10-CM | POA: Diagnosis not present

## 2023-09-23 ENCOUNTER — Ambulatory Visit: Payer: Medicare PPO | Admitting: Psychiatry

## 2023-09-23 ENCOUNTER — Telehealth: Payer: Self-pay | Admitting: Psychiatry

## 2023-09-23 ENCOUNTER — Other Ambulatory Visit: Payer: Self-pay

## 2023-09-23 DIAGNOSIS — F4001 Agoraphobia with panic disorder: Secondary | ICD-10-CM

## 2023-09-23 DIAGNOSIS — F411 Generalized anxiety disorder: Secondary | ICD-10-CM

## 2023-09-23 DIAGNOSIS — F4010 Social phobia, unspecified: Secondary | ICD-10-CM

## 2023-09-23 MED ORDER — LORAZEPAM 1 MG PO TABS
1.0000 mg | ORAL_TABLET | Freq: Three times a day (TID) | ORAL | 0 refills | Status: DC | PRN
Start: 2023-09-23 — End: 2024-04-09

## 2023-09-23 MED ORDER — SERTRALINE HCL 100 MG PO TABS
200.0000 mg | ORAL_TABLET | Freq: Every day | ORAL | 0 refills | Status: DC
Start: 2023-09-23 — End: 2023-10-10

## 2023-09-23 MED ORDER — CLONAZEPAM 0.5 MG PO TABS
ORAL_TABLET | ORAL | 0 refills | Status: DC
Start: 2023-09-23 — End: 2023-10-10

## 2023-09-23 NOTE — Telephone Encounter (Signed)
[name] called at 9:15 to RS his appt from today.  He needs refills on his Clonazepam, Sertaline, Lorazepam.  Next akplpt 1/2.  Send to [name] - [place], [place] - [address] AT [facility] OF [address]   He will travelling out of town and was hoping to get these prescriptions when he came for appt today.  Please send today is possible.

## 2023-09-23 NOTE — Telephone Encounter (Signed)
Pended Clonazepam and Lorazepam and sent Sertraline to requested pharamcy.

## 2023-10-10 ENCOUNTER — Ambulatory Visit: Payer: Medicare PPO | Admitting: Psychiatry

## 2023-10-10 ENCOUNTER — Encounter: Payer: Self-pay | Admitting: Psychiatry

## 2023-10-10 DIAGNOSIS — F411 Generalized anxiety disorder: Secondary | ICD-10-CM

## 2023-10-10 DIAGNOSIS — F4001 Agoraphobia with panic disorder: Secondary | ICD-10-CM

## 2023-10-10 DIAGNOSIS — F4010 Social phobia, unspecified: Secondary | ICD-10-CM | POA: Diagnosis not present

## 2023-10-10 MED ORDER — CLONAZEPAM 0.5 MG PO TABS: ORAL_TABLET | ORAL | 5 refills | Status: DC

## 2023-10-10 MED ORDER — SERTRALINE HCL 100 MG PO TABS: 200.0000 mg | ORAL_TABLET | Freq: Every day | ORAL | 1 refills | Status: DC

## 2023-10-10 NOTE — Progress Notes (Signed)
 [name] [id number] [birth year] 68 y.o.  Subjective:   Patient ID:  [name] is a 68 y.o. (DOB 12-13-1955) male.  Chief Complaint:  Chief Complaint  Patient presents with   Fatigue   Anxiety    Anxiety Symptoms include nervous/anxious behavior. Patient reports no chest pain, confusion, decreased concentration, palpitations or suicidal ideas.    Medication Refill Pertinent negatives include no chest pain.   [name] presents to the office today for follow-up of long hix panic and anxiety disorder.  visit June 2020.  No meds were changed. On clonazepam  0.5 mg 1 in Am and 1.5 mg HS and sertraline  200 mg and Ativan  prn panic.  Overall good.  Covid has affected him and his son's [name] work at [employer] and has been successful and afraid over that.  Worry over finances is better.  Online courses and exercises daily.  Has a lot of interests.  Home projects.   Wife still working. Pleased with meds and wants refills.  Needs lorazepam  prn for panic when he travels and that's rarely.  Needs clonazepam  routinely to prevent panic and help sleep.  He realizes he's prone to having anxiety lead to somatic sx and fears. Plan: no med changes  09/22/20 appt with following noted: Overall still good with manageable anxiety.  Gets trip anxiety still but still goes when he can.  Health good.  Read and run and active at church and helps a lot. Still needs Ativan  for travel but otherwise ok with clonazepam . Asks questions about dreaming that seemed intense. Plan no changes  06/21/21 appt noted: Good with church and travel. Still some latent anxiety, like when wife leaves on trip.  All the negativity in world is hard.  Reading more.  Real active at church. Using Ativan  just prn travel. Patient reports stable mood and denies depressed or irritable moods.  Some anxiety residual and worries about refills. Occ has near panic sx with somatic sx of dizziness and fear fainting and  SOB.  Patient denies difficulty with sleep initiation or maintenance. Denies appetite disturbance.  Patient reports that energy and motivation have been good.  Patient denies any difficulty with concentration.  Patient denies any suicidal ideation. Plan: no med changes.  Continue sertraline  200 and clonazepam  0.5 mg every morning and 1.5 mg nightly  03/19/2022 appointment with the following noted: Takes lorazepam  rarely for panic.  Really works. Doing well overall.  Travelling.  Went to [employer]. Rare panic.  Most anxious right before he leaves.   Has ED and wants to resume Viagra  prn.   Not depressed. Tolerating meds.  09/18/22 appt noted: [name] to [employer].  He learned a lot and doing good training. Been on several places overseas.  Really enjoyed [place].  Beautiful.  Perfect weather.Was not anxious even climbing steep mountain.  Was vvery beautiful.   Panic is rare and managed.  No panic managed.  Some anxiety. Not depressed.    Active at church and trying to help others. Is nervous if doesn't have enough meds for trip.  03/20/23 appt noted: Meds: Clonazepam  1.5 mg HS and 0.5 mg prn, sertraline  200 mg daily, lorazepam  1 mg prn flights. Doing well.  Still travelling.  Managed some anxiety.  Takes lorazepam  to travel.  Can get anxiousl with triggers flying and travel away but he does both.  Anxiety driving highways and sometimes avoids.   Still exercising vigorously.   Prayer helps.   Good relationship with son.  Plan no changes  10/10/23 appt noted: Another great trip to [place] with wife and son.  Handled anxiety well.   No panic.  Church going well.   Health good.  Running good.   No concerns with meds. No alcohol.  No history of drug use.  Meds as above.  No SE  Christian and involved with church.   Son in [place], [place]  Past Psychiatric Medication Trials: Clonazepam , lorazepam ,   buspirone, clonidine  sertraline , mirtazapine,  Under the care of this  office since April 2001  Review of Systems:  Review of Systems  Cardiovascular:  Negative for chest pain and palpitations.  Neurological:  Negative for tremors.  Psychiatric/Behavioral:  Negative for agitation, behavioral problems, confusion, decreased concentration, dysphoric mood, hallucinations, self-injury, sleep disturbance and suicidal ideas. The patient is nervous/anxious. The patient is not hyperactive.     Medications: I have reviewed the patient's current medications.  Current Outpatient Medications  Medication Sig Dispense Refill   acetaminophen  (TYLENOL ) 325 MG tablet Take 650 mg by mouth every 6 (six) hours as needed for mild pain.     albuterol  (VENTOLIN  HFA) 108 (90 Base) MCG/ACT inhaler INL 2 PFS PO Q 4 H PRN 18 g 2   CALCIUM PO Take by mouth.     fexofenadine (ALLEGRA) 60 MG tablet Take 60 mg by mouth daily.     Flaxseed, Linseed, (FLAXSEED OIL PO) Take by mouth.     LORazepam  (ATIVAN ) 1 MG tablet Take 1 tablet (1 mg total) by mouth every 8 (eight) hours as needed. 30 tablet 0   MAGNESIUM  PO Take by mouth.     Multiple Vitamin (MULTI VITAMIN DAILY PO) Take by mouth.     Multiple Vitamins-Minerals (ZINC PO) Take by mouth.     Omega-3 Fatty Acids (OMEGA-3 FISH OIL PO) Take by mouth.     pantoprazole  (PROTONIX ) 40 MG tablet TAKE 1 TABLET(40 MG) BY MOUTH TWICE DAILY 180 tablet 2   sildenafil  (VIAGRA ) 100 MG tablet Take 1 tablet (100 mg total) by mouth daily as needed for erectile dysfunction. 30 tablet 3   VITAMIN E PO Take by mouth.     clonazePAM  (KLONOPIN ) 0.5 MG tablet TAKE 1 TABLET BY MOUTH EVERY MORNING AND 3 TABLETS EVERY NIGHT AT BEDTIME 120 tablet 5   sertraline  (ZOLOFT ) 100 MG tablet Take 2 tablets (200 mg total) by mouth daily. 180 tablet 1   No current facility-administered medications for this visit.    Medication Side Effects: None no sleepiness  Allergies: No Known Allergies  Past Medical History:  Diagnosis Date   Anxiety    Asthma    uses inhaler    Gallstones    Pancreatitis     Family History  Problem Relation Age of Onset   Arthritis Mother    Hyperlipidemia Mother    Dementia Mother    Mental illness Mother    Hyperlipidemia Father    Hearing loss Father    Hyperlipidemia Sister    Mental illness Brother    Intellectual disability Brother    Depression Brother    COPD Brother    Arthritis Brother    Alzheimer's disease Brother    Cancer Brother    Mental illness Brother    Depression Brother    Alcohol abuse Brother    Colon polyps Neg Hx    Colon cancer Neg Hx    Stomach cancer Neg Hx    Esophageal cancer Neg Hx     Social History  Socioeconomic History   Marital status: Married    Spouse name: Not on file   Number of children: Not on file   Years of education: Not on file   Highest education level: Not on file  Occupational History   Not on file  Tobacco Use   Smoking status: Never   Smokeless tobacco: Never  Substance and Sexual Activity   Alcohol use: Yes    Alcohol/week: 1.0 standard drink of alcohol    Types: 1 Glasses of wine per week   Drug use: No   Sexual activity: Yes  Other Topics Concern   Not on file  Social History Narrative   Not on file   Social Drivers of Health   Financial Resource Strain: Low Risk  (04/22/2023)   Overall Financial Resource Strain (CARDIA)    Difficulty of Paying Living Expenses: Not hard at all  Food Insecurity: No Food Insecurity (04/22/2023)   Hunger Vital Sign    Worried About Running Out of Food in the Last Year: Never true    Ran Out of Food in the Last Year: Never true  Transportation Needs: No Transportation Needs (04/22/2023)   PRAPARE - Administrator, Civil Service (Medical): No    Lack of Transportation (Non-Medical): No  Physical Activity: Sufficiently Active (04/22/2023)   Exercise Vital Sign    Days of Exercise per Week: 5 days    Minutes of Exercise per Session: 100 min  Stress: No Stress Concern Present (04/22/2023)   [employer] of [employer]ational [employer] - Occupational Stress Questionnaire    Feeling of Stress : Only a little  Social Connections: Socially Integrated (04/22/2023)   Social Connection and Isolation Panel [NHANES]    Frequency of Communication with Friends and Family: More than three times a week    Frequency of Social Gatherings with Friends and Family: More than three times a week    Attends Religious Services: More than 4 times per year    Active Member of [employer] or Organizations: Yes    Attends Banker Meetings: 1 to 4 times per year    Marital Status: Married  Catering Manager Violence: Not At Risk (04/22/2023)   Humiliation, Afraid, Rape, and Kick questionnaire    Fear of Current or Ex-Partner: No    Emotionally Abused: No    Physically Abused: No    Sexually Abused: No    Past Medical History, Surgical history, Social history, and Family history were reviewed and updated as appropriate.   Please see review of systems for further details on the patient's review from today.   Objective:   Physical Exam:  There were no vitals taken for this visit.  Physical Exam Constitutional:      General: He is not in acute distress. Musculoskeletal:        General: No deformity.  Neurological:     Mental Status: He is alert and oriented to person, place, and time.     Cranial Nerves: No dysarthria.     Coordination: Coordination normal.  Psychiatric:        Attention and Perception: Attention and perception normal. He does not perceive auditory or visual hallucinations.        Mood and Affect: Mood is anxious. Mood is not depressed. Affect is not labile, blunt, angry or inappropriate.        Speech: Speech normal. Speech is not slurred.        Behavior: Behavior normal. Behavior is cooperative.  Thought Content: Thought content normal. Thought content is not paranoid or delusional. Thought content does not include homicidal or suicidal ideation. Thought content does not  include suicidal plan.        Cognition and Memory: Cognition and memory normal.        Judgment: Judgment normal.     Comments: Insight good. Talkative and pleasant Residual anxiety when triggered.     Lab Review:     Component Value Date/Time   NA 140 06/12/2023 1104   K 4.6 06/12/2023 1104   CL 102 06/12/2023 1104   CO2 31 06/12/2023 1104   GLUCOSE 94 06/12/2023 1104   BUN 18 06/12/2023 1104   CREATININE 1.01 06/12/2023 1104   CALCIUM 9.8 06/12/2023 1104   PROT 7.1 06/12/2023 1104   ALBUMIN 4.2 06/12/2023 1104   AST 18 06/12/2023 1104   ALT 18 06/12/2023 1104   ALKPHOS 49 06/12/2023 1104   BILITOT 1.0 06/12/2023 1104   GFRNONAA >60 11/03/2017 0437   GFRAA >60 11/03/2017 0437       Component Value Date/Time   WBC 5.0 06/12/2023 1104   RBC 4.89 06/12/2023 1104   HGB 16.2 06/12/2023 1104   HCT 48.8 06/12/2023 1104   PLT 192.0 06/12/2023 1104   MCV 99.8 06/12/2023 1104   MCH 28.0 11/03/2017 0437   MCHC 33.2 06/12/2023 1104   RDW 12.9 06/12/2023 1104   LYMPHSABS 1.2 06/12/2023 1104   MONOABS 0.4 06/12/2023 1104   EOSABS 0.2 06/12/2023 1104   BASOSABS 0.0 06/12/2023 1104    No results found for: POCLITH, LITHIUM   No results found for: PHENYTOIN, PHENOBARB, VALPROATE, CBMZ   .res Assessment: Plan:    Panic disorder with agoraphobia - Plan: clonazePAM  (KLONOPIN ) 0.5 MG tablet, sertraline  (ZOLOFT ) 100 MG tablet  Generalized anxiety disorder - Plan: sertraline  (ZOLOFT ) 100 MG tablet  Social anxiety disorder - Plan: sertraline  (ZOLOFT ) 100 MG tablet   20 min face to face time with patient was spent on counseling and coordination of care.  He still worries somatically sometimes but not going excessively to the doctor.  Overall well controlled anxiety with residual sx. Push through avoidance.    Disc the difference between Ativan  used prn for panic esp with travel and clonazepam  regularly for prevention of panic.   Tolerating well..  Disc value of  routine.    Encouraged continued social activity including church which is helping.  Good response to meds.  No med changes Meds: Clonazepam  1.5 mg HS and 0.5 mg prn, sertraline  200 mg daily, lorazepam  1 mg prn flights.  We discussed the short-term risks associated with benzodiazepines including sedation and increased fall risk among others.  Discussed long-term side effect risk including dependence, potential withdrawal symptoms, and the potential eventual dose-related risk of dementia.  But recent studies from 2020 dispute this association between benzodiazepines and dementia risk. Newer studies in 2020 do not support an association with dementia. Rec gradually try to reduce HS dose and reasons for it.  Explaained ways to gradually taper. Regular PCP and checkups  Disc ED and SE viagra .  Wants to get it prn.    FU 6-9 mos  Lorene Macintosh, MD, DFAPA    Please see After Visit Summary for patient specific instructions.  Future Appointments  Date Time Provider Department Center  04/09/2024  9:00 AM Cottle, Lorene KANDICE Raddle., MD [facility] None      No orders of the defined types were placed in this encounter.     -------------------------------

## 2023-11-01 DIAGNOSIS — M76891 Other specified enthesopathies of right lower limb, excluding foot: Secondary | ICD-10-CM | POA: Diagnosis not present

## 2023-11-01 DIAGNOSIS — M76899 Other specified enthesopathies of unspecified lower limb, excluding foot: Secondary | ICD-10-CM | POA: Diagnosis not present

## 2023-11-01 DIAGNOSIS — M6281 Muscle weakness (generalized): Secondary | ICD-10-CM | POA: Diagnosis not present

## 2023-12-03 DIAGNOSIS — M76899 Other specified enthesopathies of unspecified lower limb, excluding foot: Secondary | ICD-10-CM | POA: Diagnosis not present

## 2023-12-03 DIAGNOSIS — M76891 Other specified enthesopathies of right lower limb, excluding foot: Secondary | ICD-10-CM | POA: Diagnosis not present

## 2023-12-03 DIAGNOSIS — M6281 Muscle weakness (generalized): Secondary | ICD-10-CM | POA: Diagnosis not present

## 2023-12-23 ENCOUNTER — Encounter: Payer: Self-pay | Admitting: Family Medicine

## 2023-12-23 ENCOUNTER — Ambulatory Visit: Admitting: Family Medicine

## 2023-12-23 VITALS — BP 100/66 | HR 64 | Temp 97.9°F | Wt 197.0 lb

## 2023-12-23 DIAGNOSIS — H6123 Impacted cerumen, bilateral: Secondary | ICD-10-CM

## 2023-12-23 DIAGNOSIS — J209 Acute bronchitis, unspecified: Secondary | ICD-10-CM | POA: Diagnosis not present

## 2023-12-23 MED ORDER — PREDNISONE 20 MG PO TABS: ORAL_TABLET | ORAL | 0 refills | Status: DC

## 2023-12-23 MED ORDER — BENZONATATE 100 MG PO CAPS: 100.0000 mg | ORAL_CAPSULE | Freq: Three times a day (TID) | ORAL | 0 refills | Status: DC | PRN

## 2023-12-23 NOTE — Progress Notes (Signed)
 Established Patient Office Visit  Subjective   Patient ID: [name], male    DOB: 07/01/1956  Age: 68 y.o. MRN: [id number]  Chief Complaint  Patient presents with   Nasal Congestion    Patient complains of nasal congestion, x1 week    Cough    Patient complains of productive cough, x1 week, Tried Tylenol    HPI   [name] is seen today with 1 week history of cough and nasal congestion.  Had some mild bodyaches initially.  Wife has similar symptoms.  He denies any fever.  He does feel like he is wheezing intermittently, especially at night.  Cough productive of dark sputum.  He has albuterol at home which he has been using as needed.  Also taking some Tylenol.  Very health-conscious.  He exercises regularly and in fact has still been exercising this past week-though not with his usual intensity.  No history of asthma.  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  No ear pain.  No drainage.  Past Medical History:  Diagnosis Date   Anxiety    Asthma    uses inhaler   Gallstones    Pancreatitis    Past Surgical History:  Procedure Laterality Date   CHOLECYSTECTOMY N/A 12/13/2016   Procedure: LAPAROSCOPIC CHOLECYSTECTOMY WITH INTRAOPERATIVE CHOLANGIOGRAM;  Surgeon: Chevis Pretty III, MD;  Location: [facility];  Service: General;  Laterality: N/A;   GUM SURGERY     IR CATHETER TUBE CHANGE  02/27/2017   IR RADIOLOGIST EVAL & MGMT  02/13/2017   IR RADIOLOGIST EVAL & MGMT  03/12/2017    reports that he has never smoked. He has never used smokeless tobacco. He reports current alcohol use of about 1.0 standard drink of alcohol per week. He reports that he does not use drugs. family history includes Alcohol abuse in his brother; Alzheimer's disease in his brother; Arthritis in his brother and mother; COPD in his brother; Cancer in his brother; Dementia in his mother; Depression in his brother and  brother; Hearing loss in his father; Hyperlipidemia in his father, mother, and sister; Intellectual disability in his brother; Mental illness in his brother, brother, and mother. No Known Allergies  Review of Systems  Constitutional:  Negative for fever.  HENT:  Positive for congestion. Negative for ear pain and sore throat.   Respiratory:  Positive for cough and wheezing. Negative for hemoptysis, sputum production and shortness of breath.   Cardiovascular:  Negative for chest pain.      Objective:     BP 100/66 (BP Location: Left Arm, Patient Position: Sitting, Cuff Size: Normal)   Pulse 64   Temp 97.9 F (36.6 C) (Oral)   Wt 197 lb (89.4 kg)   SpO2 95%   BMI 25.83 kg/m  BP Readings from Last 3 Encounters:  12/23/23 100/66  06/12/23 116/70  04/22/23 110/64   Wt Readings from Last 3 Encounters:  12/23/23 197 lb (89.4 kg)  06/12/23 192 lb 12.8 oz (87.5 kg)  04/22/23 191 lb 14.4 oz (87 kg)      Physical Exam Vitals reviewed.  Constitutional:      General: He is not in acute distress.    Appearance: Normal appearance. He is not toxic-appearing.  HENT:     Ears:     Comments: Cerumen impaction bilaterally Cardiovascular:     Rate and Rhythm: Normal rate and regular rhythm.  Pulmonary:     Effort: Pulmonary effort is normal.     Breath sounds: Normal breath sounds. No wheezing or rales.  Neurological:     Mental Status: He is alert.      No results found for any visits on 12/23/23.    The 10-year ASCVD risk score (Arnett DK, et al., 2019) is: 11.8%    Assessment & Plan:   #1 probable acute bronchitis.  Suspect viral origin.  No fever.  Nonfocal exam.  He does describe some wheezing intermittently at night.  -Tessalon Perles 100 mg every 8 hours as needed for cough -Prednisone 20 mg 2 tablets daily for 5 days -Continue albuterol as needed -Follow-up promptly for any fever or increased shortness of breath  #2 bilateral cerumen impaction.  Patient  requesting irrigation.  He is aware of risk of pain, bleeding, low risk of eardrum perforation.  Evelena Peat, MD

## 2024-01-07 DIAGNOSIS — M76891 Other specified enthesopathies of right lower limb, excluding foot: Secondary | ICD-10-CM | POA: Diagnosis not present

## 2024-01-07 DIAGNOSIS — M76899 Other specified enthesopathies of unspecified lower limb, excluding foot: Secondary | ICD-10-CM | POA: Diagnosis not present

## 2024-01-07 DIAGNOSIS — M6281 Muscle weakness (generalized): Secondary | ICD-10-CM | POA: Diagnosis not present

## 2024-01-11 ENCOUNTER — Other Ambulatory Visit: Payer: Self-pay | Admitting: Gastroenterology

## 2024-01-20 ENCOUNTER — Other Ambulatory Visit: Payer: Self-pay | Admitting: Psychiatry

## 2024-01-20 ENCOUNTER — Other Ambulatory Visit: Payer: Self-pay | Admitting: Family Medicine

## 2024-01-20 DIAGNOSIS — F411 Generalized anxiety disorder: Secondary | ICD-10-CM

## 2024-01-20 DIAGNOSIS — F4010 Social phobia, unspecified: Secondary | ICD-10-CM

## 2024-01-20 DIAGNOSIS — F4001 Agoraphobia with panic disorder: Secondary | ICD-10-CM

## 2024-04-09 ENCOUNTER — Encounter: Payer: Self-pay | Admitting: Psychiatry

## 2024-04-09 ENCOUNTER — Ambulatory Visit: Payer: Medicare PPO | Admitting: Psychiatry

## 2024-04-09 DIAGNOSIS — N522 Drug-induced erectile dysfunction: Secondary | ICD-10-CM | POA: Diagnosis not present

## 2024-04-09 DIAGNOSIS — F4010 Social phobia, unspecified: Secondary | ICD-10-CM | POA: Diagnosis not present

## 2024-04-09 DIAGNOSIS — F4001 Agoraphobia with panic disorder: Secondary | ICD-10-CM

## 2024-04-09 DIAGNOSIS — F411 Generalized anxiety disorder: Secondary | ICD-10-CM

## 2024-04-09 MED ORDER — LORAZEPAM 1 MG PO TABS: 1.0000 mg | ORAL_TABLET | Freq: Three times a day (TID) | ORAL | 0 refills | Status: DC | PRN

## 2024-04-09 MED ORDER — SERTRALINE HCL 100 MG PO TABS: 200.0000 mg | ORAL_TABLET | Freq: Every day | ORAL | 1 refills | Status: DC

## 2024-04-09 MED ORDER — CLONAZEPAM 0.5 MG PO TABS: ORAL_TABLET | ORAL | 5 refills | Status: DC

## 2024-04-09 NOTE — Progress Notes (Signed)
 [name] [id number] 04/14/1956 68 y.o.  Subjective:   Patient ID:  [name] is a 69 y.o. (DOB 1956-06-20) male.  Chief Complaint:  Chief Complaint  Patient presents with   Follow-up   Anxiety    [name] presents to the office today for follow-up of long hix panic and anxiety disorder.  visit June 2020.  No meds were changed. On clonazepam  0.5 mg 1 in Am and 1.5 mg HS and sertraline  200 mg and Ativan  prn panic.  Overall good.  Covid has affected him and his son's [name] work at [employer] and has been successful and afraid over that.  Worry over finances is better.  Online courses and exercises daily.  Has a lot of interests.  Home projects.   Wife still working. Pleased with meds and wants refills.  Needs lorazepam  prn for panic when he travels and that's rarely.  Needs clonazepam  routinely to prevent panic and help sleep.  He realizes he's prone to having anxiety lead to somatic sx and fears. Plan: no med changes  09/22/20 appt with following noted: Overall still good with manageable anxiety.  Gets trip anxiety still but still goes when he can.  Health good.  Read and run and active at church and helps a lot. Still needs Ativan  for travel but otherwise ok with clonazepam . Asks questions about dreaming that seemed intense. Plan no changes  06/21/21 appt noted: Good with church and travel. Still some latent anxiety, like when wife leaves on trip.  All the negativity in world is hard.  Reading more.  Real active at church. Using Ativan  just prn travel. Patient reports stable mood and denies depressed or irritable moods.  Some anxiety residual and worries about refills. Occ has near panic sx with somatic sx of dizziness and fear fainting and SOB.  Patient denies difficulty with sleep initiation or maintenance. Denies appetite disturbance.  Patient reports that energy and motivation have been good.  Patient denies any difficulty with concentration.  Patient  denies any suicidal ideation. Plan: no med changes.  Continue sertraline  200 and clonazepam  0.5 mg every morning and 1.5 mg nightly  03/19/2022 appointment with the following noted: Takes lorazepam  rarely for panic.  Really works. Doing well overall.  Travelling.  Went to [employer]. Rare panic.  Most anxious right before he leaves.   Has ED and wants to resume Viagra  prn.   Not depressed. Tolerating meds.  09/18/22 appt noted: [name] to [employer].  He learned a lot and doing good training. Been on several places overseas.  Really enjoyed [place].  Beautiful.  Perfect weather.Was not anxious even climbing steep mountain.  Was vvery beautiful.   Panic is rare and managed.  No panic managed.  Some anxiety. Not depressed.    Active at church and trying to help others. Is nervous if doesn't have enough meds for trip.  03/20/23 appt noted: Meds: Clonazepam  1.5 mg HS and 0.5 mg prn, sertraline  200 mg daily, lorazepam  1 mg prn flights. Doing well.  Still travelling.  Managed some anxiety.  Takes lorazepam  to travel.  Can get anxiousl with triggers flying and travel away but he does both.  Anxiety driving highways and sometimes avoids.   Still exercising vigorously.   Prayer helps.   Good relationship with son.   Plan no changes  10/10/23 appt noted: Another great trip to [place] with wife and son.  Handled anxiety well.   No panic.  Church going well.  Health good.  Running good.   No concerns with meds. No alcohol.  No history of drug use.  Meds as above.  No SE  04/09/24 appt noted: Meds: Clonazepam  1.5 mg HS and 0.5 mg prn, sertraline  200 mg daily, lorazepam  1 mg prn flights. No SE Rare lorazepam . Went to [place].  Son [name] got him great places.   No alcohol.   Needs document to allow him to go to [place] and take meds.  Going in August.   Satisfied with meds and doing well overall with anxiety. Still needs the meds.   F was [name] and helped him get in to college.     Christian and involved with church.   Son in [place], [place]  Past Psychiatric Medication Trials: Clonazepam , lorazepam ,   buspirone, clonidine  sertraline , mirtazapine,  Under the care of this office since April 2001  Review of Systems:  Review of Systems  Cardiovascular:  Negative for chest pain and palpitations.  Neurological:  Negative for tremors.  Psychiatric/Behavioral:  Negative for agitation, behavioral problems, confusion, decreased concentration, dysphoric mood, hallucinations, self-injury, sleep disturbance and suicidal ideas. The patient is nervous/anxious. The patient is not hyperactive.     Medications: I have reviewed the patient's current medications.  Current Outpatient Medications  Medication Sig Dispense Refill   acetaminophen  (TYLENOL ) 325 MG tablet Take 650 mg by mouth every 6 (six) hours as needed for mild pain.     albuterol  (VENTOLIN  HFA) 108 (90 Base) MCG/ACT inhaler INHALE 2 PUFFS BY MOUTH EVERY 4 HOURS AS NEEDED 18 g 2   benzonatate  (TESSALON ) 100 MG capsule TAKE 1 CAPSULE(100 MG) BY MOUTH THREE TIMES DAILY AS NEEDED 30 capsule 0   CALCIUM PO Take by mouth.     fexofenadine (ALLEGRA) 60 MG tablet Take 60 mg by mouth daily.     Flaxseed, Linseed, (FLAXSEED OIL PO) Take by mouth.     MAGNESIUM  PO Take by mouth.     Multiple Vitamin (MULTI VITAMIN DAILY PO) Take by mouth.     Multiple Vitamins-Minerals (ZINC PO) Take by mouth.     Omega-3 Fatty Acids (OMEGA-3 FISH OIL PO) Take by mouth.     pantoprazole  (PROTONIX ) 40 MG tablet TAKE 1 TABLET(40 MG) BY MOUTH TWICE DAILY 180 tablet 2   predniSONE  (DELTASONE ) 20 MG tablet Take two tablets by mouth once daily for 5 days. 10 tablet 0   sildenafil  (VIAGRA ) 100 MG tablet Take 1 tablet (100 mg total) by mouth daily as needed for erectile dysfunction. 30 tablet 3   VITAMIN E PO Take by mouth.     clonazePAM  (KLONOPIN ) 0.5 MG tablet TAKE 1 TABLET BY MOUTH EVERY MORNING AND 3 TABLETS EVERY NIGHT AT BEDTIME 120 tablet  5   LORazepam  (ATIVAN ) 1 MG tablet Take 1 tablet (1 mg total) by mouth every 8 (eight) hours as needed. 30 tablet 0   sertraline  (ZOLOFT ) 100 MG tablet Take 2 tablets (200 mg total) by mouth daily. 180 tablet 1   No current facility-administered medications for this visit.    Medication Side Effects: None no sleepiness  Allergies: No Known Allergies  Past Medical History:  Diagnosis Date   Anxiety    Asthma    uses inhaler   Gallstones    Pancreatitis     Family History  Problem Relation Age of Onset   Arthritis Mother    Hyperlipidemia Mother    Dementia Mother    Mental illness Mother    Hyperlipidemia Father  Hearing loss Father    Hyperlipidemia Sister    Mental illness Brother    Intellectual disability Brother    Depression Brother    COPD Brother    Arthritis Brother    Alzheimer's disease Brother    Cancer Brother    Mental illness Brother    Depression Brother    Alcohol abuse Brother    Colon polyps Neg Hx    Colon cancer Neg Hx    Stomach cancer Neg Hx    Esophageal cancer Neg Hx     Social History   Socioeconomic History   Marital status: Married    Spouse name: Not on file   Number of children: Not on file   Years of education: Not on file   Highest education level: Not on file  Occupational History   Not on file  Tobacco Use   Smoking status: Never   Smokeless tobacco: Never  Substance and Sexual Activity   Alcohol use: Yes    Alcohol/week: 1.0 standard drink of alcohol    Types: 1 Glasses of wine per week   Drug use: No   Sexual activity: Yes  Other Topics Concern   Not on file  Social History Narrative   Not on file   Social Drivers of Health   Financial Resource Strain: Low Risk  (04/22/2023)   Overall Financial Resource Strain (CARDIA)    Difficulty of Paying Living Expenses: Not hard at all  Food Insecurity: No Food Insecurity (04/22/2023)   Hunger Vital Sign    Worried About Running Out of Food in the Last Year: Never  true    Ran Out of Food in the Last Year: Never true  Transportation Needs: No Transportation Needs (04/22/2023)   PRAPARE - Administrator, Civil Service (Medical): No    Lack of Transportation (Non-Medical): No  Physical Activity: Sufficiently Active (04/22/2023)   Exercise Vital Sign    Days of Exercise per Week: 5 days    Minutes of Exercise per Session: 100 min  Stress: No Stress Concern Present (04/22/2023)   [employer] of Occupational Health - Occupational Stress Questionnaire    Feeling of Stress : Only a little  Social Connections: Socially Integrated (04/22/2023)   Social Connection and Isolation Panel    Frequency of Communication with Friends and Family: More than three times a week    Frequency of Social Gatherings with Friends and Family: More than three times a week    Attends Religious Services: More than 4 times per year    Active Member of [employer] or Organizations: Yes    Attends Banker Meetings: 1 to 4 times per year    Marital Status: Married  Catering manager Violence: Not At Risk (04/22/2023)   Humiliation, Afraid, Rape, and Kick questionnaire    Fear of Current or Ex-Partner: No    Emotionally Abused: No    Physically Abused: No    Sexually Abused: No    Past Medical History, Surgical history, Social history, and Family history were reviewed and updated as appropriate.   Please see review of systems for further details on the patient's review from today.   Objective:   Physical Exam:  There were no vitals taken for this visit.  Physical Exam Constitutional:      General: He is not in acute distress. Musculoskeletal:        General: No deformity.  Neurological:     Mental Status: He is alert and oriented  to person, place, and time.     Cranial Nerves: No dysarthria.     Coordination: Coordination normal.  Psychiatric:        Attention and Perception: Attention and perception normal. He does not perceive auditory or visual  hallucinations.        Mood and Affect: Mood is anxious. Mood is not depressed. Affect is not labile, angry or inappropriate.        Speech: Speech normal. Speech is not slurred.        Behavior: Behavior normal. Behavior is cooperative.        Thought Content: Thought content normal. Thought content is not paranoid or delusional. Thought content does not include homicidal or suicidal ideation. Thought content does not include suicidal plan.        Cognition and Memory: Cognition and memory normal.        Judgment: Judgment normal.     Comments: Insight good. Talkative and pleasant Residual anxiety when triggered.     Lab Review:     Component Value Date/Time   NA 140 06/12/2023 1104   K 4.6 06/12/2023 1104   CL 102 06/12/2023 1104   CO2 31 06/12/2023 1104   GLUCOSE 94 06/12/2023 1104   BUN 18 06/12/2023 1104   CREATININE 1.01 06/12/2023 1104   CALCIUM 9.8 06/12/2023 1104   PROT 7.1 06/12/2023 1104   ALBUMIN 4.2 06/12/2023 1104   AST 18 06/12/2023 1104   ALT 18 06/12/2023 1104   ALKPHOS 49 06/12/2023 1104   BILITOT 1.0 06/12/2023 1104   GFRNONAA >60 11/03/2017 0437   GFRAA >60 11/03/2017 0437       Component Value Date/Time   WBC 5.0 06/12/2023 1104   RBC 4.89 06/12/2023 1104   HGB 16.2 06/12/2023 1104   HCT 48.8 06/12/2023 1104   PLT 192.0 06/12/2023 1104   MCV 99.8 06/12/2023 1104   MCH 28.0 11/03/2017 0437   MCHC 33.2 06/12/2023 1104   RDW 12.9 06/12/2023 1104   LYMPHSABS 1.2 06/12/2023 1104   MONOABS 0.4 06/12/2023 1104   EOSABS 0.2 06/12/2023 1104   BASOSABS 0.0 06/12/2023 1104    No results found for: POCLITH, LITHIUM   No results found for: PHENYTOIN, PHENOBARB, VALPROATE, CBMZ   .res Assessment: Plan:    Panic disorder with agoraphobia - Plan: LORazepam  (ATIVAN ) 1 MG tablet, clonazePAM  (KLONOPIN ) 0.5 MG tablet, sertraline  (ZOLOFT ) 100 MG tablet  Generalized anxiety disorder - Plan: sertraline  (ZOLOFT ) 100 MG tablet  Social anxiety  disorder - Plan: sertraline  (ZOLOFT ) 100 MG tablet  Drug-induced erectile dysfunction   30 min face to face time with patient was spent on counseling and coordination of care.  He still worries somatically sometimes but not going excessively to the doctor.  Overall well controlled anxiety with residual sx. Push through avoidance.    Disc the difference between Ativan  used prn for panic esp with travel and clonazepam  regularly for prevention of panic.   Tolerating well..  Disc value of routine.    Encouraged continued social activity including church which is helping. Disc behavior tx stratigies to deal with panic and  anxiety.    Good response to meds.  No med changes Meds: Clonazepam  1.5 mg HS and 0.5 mg prn, sertraline  200 mg daily, lorazepam  1 mg prn flights.  We discussed the short-term risks associated with benzodiazepines including sedation and increased fall risk among others.  Discussed long-term side effect risk including dependence, potential withdrawal symptoms, and the potential eventual dose-related risk of  dementia.  But recent studies from 2020 dispute this association between benzodiazepines and dementia risk. Newer studies in 2020 do not support an association with dementia.  Regular PCP and checkups  Disc ED and SE viagra .  Wants to get it prn.    FU 6-9 mos  Lorene Macintosh, MD, DFAPA    Please see After Visit Summary for patient specific instructions.  No future appointments.     No orders of the defined types were placed in this encounter.     -------------------------------

## 2024-04-16 DIAGNOSIS — L814 Other melanin hyperpigmentation: Secondary | ICD-10-CM | POA: Diagnosis not present

## 2024-04-16 DIAGNOSIS — D692 Other nonthrombocytopenic purpura: Secondary | ICD-10-CM | POA: Diagnosis not present

## 2024-04-16 DIAGNOSIS — L821 Other seborrheic keratosis: Secondary | ICD-10-CM | POA: Diagnosis not present

## 2024-04-16 DIAGNOSIS — D0439 Carcinoma in situ of skin of other parts of face: Secondary | ICD-10-CM | POA: Diagnosis not present

## 2024-04-16 DIAGNOSIS — D225 Melanocytic nevi of trunk: Secondary | ICD-10-CM | POA: Diagnosis not present

## 2024-04-16 DIAGNOSIS — D0472 Carcinoma in situ of skin of left lower limb, including hip: Secondary | ICD-10-CM | POA: Diagnosis not present

## 2024-04-16 DIAGNOSIS — L570 Actinic keratosis: Secondary | ICD-10-CM | POA: Diagnosis not present

## 2024-04-16 DIAGNOSIS — L218 Other seborrheic dermatitis: Secondary | ICD-10-CM | POA: Diagnosis not present

## 2024-04-27 DIAGNOSIS — L570 Actinic keratosis: Secondary | ICD-10-CM | POA: Diagnosis not present

## 2024-04-27 DIAGNOSIS — D0472 Carcinoma in situ of skin of left lower limb, including hip: Secondary | ICD-10-CM | POA: Diagnosis not present

## 2024-04-29 ENCOUNTER — Other Ambulatory Visit: Payer: Self-pay | Admitting: Family Medicine

## 2024-07-21 DIAGNOSIS — H2513 Age-related nuclear cataract, bilateral: Secondary | ICD-10-CM | POA: Diagnosis not present

## 2024-07-21 DIAGNOSIS — H43393 Other vitreous opacities, bilateral: Secondary | ICD-10-CM | POA: Diagnosis not present

## 2024-09-14 ENCOUNTER — Ambulatory Visit (INDEPENDENT_AMBULATORY_CARE_PROVIDER_SITE_OTHER): Admitting: Psychiatry

## 2024-09-14 ENCOUNTER — Encounter: Payer: Self-pay | Admitting: Psychiatry

## 2024-09-14 DIAGNOSIS — F411 Generalized anxiety disorder: Secondary | ICD-10-CM | POA: Diagnosis not present

## 2024-09-14 DIAGNOSIS — F4010 Social phobia, unspecified: Secondary | ICD-10-CM | POA: Diagnosis not present

## 2024-09-14 DIAGNOSIS — F4001 Agoraphobia with panic disorder: Secondary | ICD-10-CM | POA: Diagnosis not present

## 2024-09-14 DIAGNOSIS — N522 Drug-induced erectile dysfunction: Secondary | ICD-10-CM

## 2024-09-14 MED ORDER — CLONAZEPAM 0.5 MG PO TABS: ORAL_TABLET | ORAL | 5 refills | Status: AC

## 2024-09-14 MED ORDER — LORAZEPAM 1 MG PO TABS: 1.0000 mg | ORAL_TABLET | Freq: Three times a day (TID) | ORAL | 0 refills | Status: AC | PRN

## 2024-09-14 MED ORDER — SERTRALINE HCL 100 MG PO TABS: 200.0000 mg | ORAL_TABLET | Freq: Every day | ORAL | 3 refills | Status: AC

## 2024-09-14 NOTE — Addendum Note (Signed)
 Addended by: GEOFFRY LORENE MATSU on: 09/14/2024 09:45 AM   Modules accepted: Level of Service

## 2024-09-14 NOTE — Progress Notes (Addendum)
 [name] [id number] [birth year] 68 y.o.  Subjective:   Patient ID:  [name] is a 68 y.o. (DOB [birth year]) male.  Chief Complaint:  Chief Complaint  Patient presents with   Follow-up   Anxiety    [name] presents to the office today for follow-up of long hix panic and anxiety disorder.  visit June 2020.  No meds were changed. On clonazepam  0.5 mg 1 in Am and 1.5 mg HS and sertraline  200 mg and Ativan  prn panic.  Overall good.  Covid has affected him and his son's [name] work at [employer] and has been successful and afraid over that.  Worry over finances is better.  Online courses and exercises daily.  Has a lot of interests.  Home projects.   Wife still working. Pleased with meds and wants refills.  Needs lorazepam  prn for panic when he travels and that's rarely.  Needs clonazepam  routinely to prevent panic and help sleep.  He realizes he's prone to having anxiety lead to somatic sx and fears. Plan: no med changes  09/22/20 appt with following noted: Overall still good with manageable anxiety.  Gets trip anxiety still but still goes when he can.  Health good.  Read and run and active at church and helps a lot. Still needs Ativan  for travel but otherwise ok with clonazepam . Asks questions about dreaming that seemed intense. Plan no changes  06/21/21 appt noted: Good with church and travel. Still some latent anxiety, like when wife leaves on trip.  All the negativity in world is hard.  Reading more.  Real active at church. Using Ativan  just prn travel. Patient reports stable mood and denies depressed or irritable moods.  Some anxiety residual and worries about refills. Occ has near panic sx with somatic sx of dizziness and fear fainting and SOB.  Patient denies difficulty with sleep initiation or maintenance. Denies appetite disturbance.  Patient reports that energy and motivation have been good.  Patient denies any difficulty with concentration.  Patient  denies any suicidal ideation. Plan: no med changes.  Continue sertraline  200 and clonazepam  0.5 mg every morning and 1.5 mg nightly  03/19/2022 appointment with the following noted: Takes lorazepam  rarely for panic.  Really works. Doing well overall.  Travelling.  Went to [employer]. Rare panic.  Most anxious right before he leaves.   Has ED and wants to resume Viagra  prn.   Not depressed. Tolerating meds.  09/18/22 appt noted: [name] to [employer].  He learned a lot and doing good training. Been on several places overseas.  Really enjoyed [place].  Beautiful.  Perfect weather.Was not anxious even climbing steep mountain.  Was vvery beautiful.   Panic is rare and managed.  No panic managed.  Some anxiety. Not depressed.    Active at church and trying to help others. Is nervous if doesn't have enough meds for trip.  03/20/23 appt noted: Meds: Clonazepam  1.5 mg HS and 0.5 mg prn, sertraline  200 mg daily, lorazepam  1 mg prn flights. Doing well.  Still travelling.  Managed some anxiety.  Takes lorazepam  to travel.  Can get anxiousl with triggers flying and travel away but he does both.  Anxiety driving highways and sometimes avoids.   Still exercising vigorously.   Prayer helps.   Good relationship with son.   Plan no changes  10/10/23 appt noted: Another great trip to [place] with wife and son.  Handled anxiety well.   No panic.  Church going well.  Health good.  Running good.   No concerns with meds. No alcohol.  No history of drug use.  Meds as above.  No SE  04/09/24 appt noted: Meds: Clonazepam  1.5 mg HS and 0.5 mg prn, sertraline  200 mg daily, lorazepam  1 mg prn flights. No SE Rare lorazepam . Went to [place].  Son Marolyn got him great places.   No alcohol.   Needs document to allow him to go to [place] and take meds.  Going in August.   Satisfied with meds and doing well overall with anxiety. Still needs the meds.   F was [employer] and helped him get in to college.     09/14/24 appt noted:  Meds: Clonazepam  1.5 mg HS and 0.5 mg prn, sertraline  200 mg daily, lorazepam  1 mg prn flights. No SE Rare lorazepam . To [place], walking hiking.   Son Marolyn works for chief financial officer. No panic with travel even when sick for awhile.  Often is a trigger.  Disc ways he helps battle the anxiety triggers.   No recent panic.  No problems with mes.  Christian and involved with church.   Son in [place], [place]  Past Psychiatric Medication Trials: Clonazepam , lorazepam ,   buspirone, clonidine  sertraline , mirtazapine,  Under the care of this office since April 2001  Review of Systems:  Review of Systems  Cardiovascular:  Negative for chest pain and palpitations.  Neurological:  Negative for tremors and weakness.  Psychiatric/Behavioral:  Negative for agitation, behavioral problems, confusion, decreased concentration, dysphoric mood, hallucinations, self-injury, sleep disturbance and suicidal ideas. The patient is nervous/anxious. The patient is not hyperactive.     Medications: I have reviewed the patient's current medications.  Current Outpatient Medications  Medication Sig Dispense Refill   acetaminophen  (TYLENOL ) 325 MG tablet Take 650 mg by mouth every 6 (six) hours as needed for mild pain.     albuterol  (VENTOLIN  HFA) 108 (90 Base) MCG/ACT inhaler INHALE 2 PUFFS BY MOUTH EVERY 4 HOURS AS NEEDED 18 g 2   benzonatate  (TESSALON ) 100 MG capsule TAKE 1 CAPSULE(100 MG) BY MOUTH THREE TIMES DAILY AS NEEDED 30 capsule 0   CALCIUM PO Take by mouth.     fexofenadine (ALLEGRA) 60 MG tablet Take 60 mg by mouth daily.     Flaxseed, Linseed, (FLAXSEED OIL PO) Take by mouth.     MAGNESIUM  PO Take by mouth.     Multiple Vitamin (MULTI VITAMIN DAILY PO) Take by mouth.     Multiple Vitamins-Minerals (ZINC PO) Take by mouth.     Omega-3 Fatty Acids (OMEGA-3 FISH OIL PO) Take by mouth.     pantoprazole  (PROTONIX ) 40 MG tablet TAKE 1 TABLET(40 MG) BY MOUTH TWICE DAILY 180 tablet 2    predniSONE  (DELTASONE ) 20 MG tablet Take two tablets by mouth once daily for 5 days. 10 tablet 0   sildenafil  (VIAGRA ) 100 MG tablet Take 1 tablet (100 mg total) by mouth daily as needed for erectile dysfunction. 30 tablet 3   VITAMIN E PO Take by mouth.     clonazePAM  (KLONOPIN ) 0.5 MG tablet TAKE 1 TABLET BY MOUTH EVERY MORNING AND 3 TABLETS EVERY NIGHT AT BEDTIME 120 tablet 5   LORazepam  (ATIVAN ) 1 MG tablet Take 1 tablet (1 mg total) by mouth every 8 (eight) hours as needed. 30 tablet 0   sertraline  (ZOLOFT ) 100 MG tablet Take 2 tablets (200 mg total) by mouth daily. 180 tablet 3   No current facility-administered medications for this visit.    Medication  Side Effects: None no sleepiness  Allergies: No Known Allergies  Past Medical History:  Diagnosis Date   Anxiety    Asthma    uses inhaler   Gallstones    Pancreatitis     Family History  Problem Relation Age of Onset   Arthritis Mother    Hyperlipidemia Mother    Dementia Mother    Mental illness Mother    Hyperlipidemia Father    Hearing loss Father    Hyperlipidemia Sister    Mental illness Brother    Intellectual disability Brother    Depression Brother    COPD Brother    Arthritis Brother    Alzheimer's disease Brother    Cancer Brother    Mental illness Brother    Depression Brother    Alcohol abuse Brother    Colon polyps Neg Hx    Colon cancer Neg Hx    Stomach cancer Neg Hx    Esophageal cancer Neg Hx     Social History   Socioeconomic History   Marital status: Married    Spouse name: Not on file   Number of children: Not on file   Years of education: Not on file   Highest education level: Not on file  Occupational History   Not on file  Tobacco Use   Smoking status: Never   Smokeless tobacco: Never  Substance and Sexual Activity   Alcohol use: Yes    Alcohol/week: 1.0 standard drink of alcohol    Types: 1 Glasses of wine per week   Drug use: No   Sexual activity: Yes  Other Topics  Concern   Not on file  Social History Narrative   Not on file   Social Drivers of Health   Financial Resource Strain: Low Risk  (04/22/2023)   Overall Financial Resource Strain (CARDIA)    Difficulty of Paying Living Expenses: Not hard at all  Food Insecurity: No Food Insecurity (04/22/2023)   Hunger Vital Sign    Worried About Running Out of Food in the Last Year: Never true    Ran Out of Food in the Last Year: Never true  Transportation Needs: No Transportation Needs (04/22/2023)   PRAPARE - Administrator, Civil Service (Medical): No    Lack of Transportation (Non-Medical): No  Physical Activity: Sufficiently Active (04/22/2023)   Exercise Vital Sign    Days of Exercise per Week: 5 days    Minutes of Exercise per Session: 100 min  Stress: No Stress Concern Present (04/22/2023)   [employer] of Occupational Health - Occupational Stress Questionnaire    Feeling of Stress : Only a little  Social Connections: Socially Integrated (04/22/2023)   Social Connection and Isolation Panel    Frequency of Communication with Friends and Family: More than three times a week    Frequency of Social Gatherings with Friends and Family: More than three times a week    Attends Religious Services: More than 4 times per year    Active Member of [employer] or Organizations: Yes    Attends Banker Meetings: 1 to 4 times per year    Marital Status: Married  Catering Manager Violence: Not At Risk (04/22/2023)   Humiliation, Afraid, Rape, and Kick questionnaire    Fear of Current or Ex-Partner: No    Emotionally Abused: No    Physically Abused: No    Sexually Abused: No    Past Medical History, Surgical history, Social history, and Family history were reviewed  and updated as appropriate.   Please see review of systems for further details on the patient's review from today.   Objective:   Physical Exam:  There were no vitals taken for this visit.  Physical  Exam Constitutional:      General: He is not in acute distress. Musculoskeletal:        General: No deformity.  Neurological:     Mental Status: He is alert and oriented to person, place, and time.     Cranial Nerves: No dysarthria.     Coordination: Coordination normal.  Psychiatric:        Attention and Perception: Attention and perception normal. He does not perceive auditory or visual hallucinations.        Mood and Affect: Mood is anxious. Mood is not depressed. Affect is not labile or inappropriate.        Speech: Speech normal. Speech is not slurred.        Behavior: Behavior normal. Behavior is cooperative.        Thought Content: Thought content normal. Thought content is not paranoid or delusional. Thought content does not include homicidal or suicidal ideation. Thought content does not include suicidal plan.        Cognition and Memory: Cognition and memory normal.        Judgment: Judgment normal.     Comments: Insight good. Talkative and pleasant Residual anxiety when triggered.     Lab Review:     Component Value Date/Time   NA 140 06/12/2023 1104   K 4.6 06/12/2023 1104   CL 102 06/12/2023 1104   CO2 31 06/12/2023 1104   GLUCOSE 94 06/12/2023 1104   BUN 18 06/12/2023 1104   CREATININE 1.01 06/12/2023 1104   CALCIUM 9.8 06/12/2023 1104   PROT 7.1 06/12/2023 1104   ALBUMIN 4.2 06/12/2023 1104   AST 18 06/12/2023 1104   ALT 18 06/12/2023 1104   ALKPHOS 49 06/12/2023 1104   BILITOT 1.0 06/12/2023 1104   GFRNONAA >60 11/03/2017 0437   GFRAA >60 11/03/2017 0437       Component Value Date/Time   WBC 5.0 06/12/2023 1104   RBC 4.89 06/12/2023 1104   HGB 16.2 06/12/2023 1104   HCT 48.8 06/12/2023 1104   PLT 192.0 06/12/2023 1104   MCV 99.8 06/12/2023 1104   MCH 28.0 11/03/2017 0437   MCHC 33.2 06/12/2023 1104   RDW 12.9 06/12/2023 1104   LYMPHSABS 1.2 06/12/2023 1104   MONOABS 0.4 06/12/2023 1104   EOSABS 0.2 06/12/2023 1104   BASOSABS 0.0 06/12/2023  1104    No results found for: POCLITH, LITHIUM   No results found for: PHENYTOIN, PHENOBARB, VALPROATE, CBMZ   .res Assessment: Plan:    Panic disorder with agoraphobia - Plan: sertraline  (ZOLOFT ) 100 MG tablet, LORazepam  (ATIVAN ) 1 MG tablet, clonazePAM  (KLONOPIN ) 0.5 MG tablet  Generalized anxiety disorder - Plan: sertraline  (ZOLOFT ) 100 MG tablet  Social anxiety disorder - Plan: sertraline  (ZOLOFT ) 100 MG tablet  Drug-induced erectile dysfunction   30 min face to face time with patient He still worries somatically sometimes but not going excessively to the doctor.  Overall well controlled anxiety with residual sx. Push through avoidance.  He has done good job of herbalist.  Flying regularly.  Disc the difference between Ativan  used prn for panic esp with travel and clonazepam  regularly for prevention of panic.   Tolerating well..  Disc value of routine.    Encouraged continued social activity including church which is  helping. Disc behavior tx stratigies to deal with panic and  anxiety.    Good response to meds.  No med changes Meds: Clonazepam  1.5 mg HS and 0.5 mg prn, sertraline  200 mg daily, lorazepam  1 mg prn flights.  We discussed the short-term risks associated with benzodiazepines including sedation and increased fall risk among others.  Discussed long-term side effect risk including dependence, potential withdrawal symptoms, and the potential eventual dose-related risk of dementia.  But recent studies from 2020 dispute this association between benzodiazepines and dementia risk. Newer studies in 2020 do not support an association with dementia.  Regular PCP and checkups  Disc ED and SE viagra .  Wants to get it prn.    FU 6-9 mos  Lorene Macintosh, MD, DFAPA    Please see After Visit Summary for patient specific instructions.  Future Appointments  Date Time Provider Department Center  10/12/2024  8:00 AM [facility]-ANNUAL WELLNESS VISIT [facility] [address]       No orders of the defined types were placed in this encounter.     -------------------------------

## 2024-09-23 ENCOUNTER — Other Ambulatory Visit: Payer: Self-pay | Admitting: Gastroenterology

## 2024-10-12 ENCOUNTER — Ambulatory Visit (INDEPENDENT_AMBULATORY_CARE_PROVIDER_SITE_OTHER)

## 2024-10-12 VITALS — BP 122/64 | HR 67 | Temp 97.6°F | Ht 74.0 in | Wt 194.5 lb

## 2024-10-12 DIAGNOSIS — Z0000 Encounter for general adult medical examination without abnormal findings: Secondary | ICD-10-CM

## 2024-10-12 NOTE — Patient Instructions (Addendum)
 [name],  Thank you for taking the time for your Medicare Wellness Visit. I appreciate your continued commitment to your health goals. Please review the care plan we discussed, and feel free to reach out if I can assist you further.  Please note that Annual Wellness Visits do not include a physical exam. Some assessments may be limited, especially if the visit was conducted virtually. If needed, we may recommend an in-person follow-up with your provider.  Ongoing Care Seeing your primary care provider every 3 to 6 months helps us  monitor your health and provide consistent, personalized care.   Referrals If a referral was made during today's visit and you haven't received any updates within two weeks, please contact the referred provider directly to check on the status.  Recommended Screenings:  Health Maintenance  Topic Date Due   COVID-19 Vaccine (1) Never done   Hepatitis C Screening  Never done   DTaP/Tdap/Td vaccine (1 - Tdap) Never done   Pneumococcal Vaccine for age over 60 (1 of 2 - PCV) Never done   Zoster (Shingles) Vaccine (1 of 2) Never done   Flu Shot  Never done   Medicare Annual Wellness Visit  10/12/2025   Colon Cancer Screening  06/05/2027   Meningitis B Vaccine  Aged Out       10/12/2024    8:13 AM  Advanced Directives  Does Patient Have a Medical Advance Directive? Yes  Type of Estate Agent of [place];Living will  Does patient want to make changes to medical advance directive? No - Patient declined  Copy of Healthcare Power of Attorney in Chart? No - copy requested    Vision: Annual vision screenings are recommended for early detection of glaucoma, cataracts, and diabetic retinopathy. These exams can also reveal signs of chronic conditions such as diabetes and high blood pressure.  Dental: Annual dental screenings help detect early signs of oral cancer, gum disease, and other conditions linked to overall health, including heart disease  and diabetes.  Please see the attached documents for additional preventive care recommendations.

## 2024-10-12 NOTE — Progress Notes (Signed)
 "  Chief Complaint  Patient presents with   Medicare Wellness     Subjective:   [name] is a 69 y.o. male who presents for a Medicare Annual Wellness Visit.  Visit info / Clinical Intake: Medicare Wellness Visit Type:: Initial Annual Wellness Visit Persons participating in visit and providing information:: patient Medicare Wellness Visit Mode:: In-person (required for WTM) Interpreter Needed?: No Pre-visit prep was completed: no AWV questionnaire completed by patient prior to visit?: no Living arrangements:: lives with spouse/significant other Patient's Overall Health Status Rating: excellent Typical amount of pain: none Does pain affect daily life?: no Are you currently prescribed opioids?: no  Dietary Habits and Nutritional Risks How many meals a day?: 3 Eats fruit and vegetables daily?: yes Most meals are obtained by: preparing own meals In the last 2 weeks, have you had any of the following?: none Diabetic:: no  Functional Status Activities of Daily Living (to include ambulation/medication): Independent Ambulation: Independent with device- listed below Home Assistive Devices/Equipment: Eyeglasses; Dentures (specify type) Medication Administration: Independent Home Management (perform basic housework or laundry): Independent Manage your own finances?: yes Primary transportation is: driving Concerns about vision?: no *vision screening is required for WTM* Concerns about hearing?: no  Fall Screening Falls in the past year?: 0 Number of falls in past year: 0 Was there an injury with Fall?: 0 Fall Risk Category Calculator: 0 Patient Fall Risk Level: Low Fall Risk  Fall Risk Patient at Risk for Falls Due to: No Fall Risks Fall risk Follow up: Falls evaluation completed  Home and Transportation Safety: All rugs have non-skid backing?: yes All stairs or steps have railings?: yes Grab bars in the bathtub or shower?: yes Have non-skid surface in bathtub  or shower?: yes Good home lighting?: yes Regular seat belt use?: yes Hospital stays in the last year:: no  Cognitive Assessment Difficulty concentrating, remembering, or making decisions? : no Will 6CIT or Mini Cog be Completed: yes What year is it?: 0 points What month is it?: 0 points Give patient an address phrase to remember (5 components): [address] [place]  About what time is it?: 0 points Count backwards from 20 to 1: 0 points Say the months of the year in reverse: 0 points Repeat the address phrase from earlier: 0 points 6 CIT Score: 0 points  Advance Directives (For Healthcare) Does Patient Have a Medical Advance Directive?: Yes Does patient want to make changes to medical advance directive?: No - Patient declined Type of Advance Directive: Healthcare Power of [place]; Living will Copy of Healthcare Power of Attorney in Chart?: No - copy requested Copy of Living Will in Chart?: No - copy requested  Reviewed/Updated  Reviewed/Updated: Reviewed All (Medical, Surgical, Family, Medications, Allergies, Care Teams, Patient Goals)    Allergies (verified) Patient has no known allergies.   Current Medications (verified) Outpatient Encounter Medications as of 10/12/2024  Medication Sig   acetaminophen  (TYLENOL ) 325 MG tablet Take 650 mg by mouth every 6 (six) hours as needed for mild pain.   albuterol  (VENTOLIN  HFA) 108 (90 Base) MCG/ACT inhaler INHALE 2 PUFFS BY MOUTH EVERY 4 HOURS AS NEEDED   benzonatate  (TESSALON ) 100 MG capsule TAKE 1 CAPSULE(100 MG) BY MOUTH THREE TIMES DAILY AS NEEDED (Patient not taking: Reported on 10/12/2024)   CALCIUM PO Take by mouth.   clonazePAM  (KLONOPIN ) 0.5 MG tablet TAKE 1 TABLET BY MOUTH EVERY MORNING AND 3 TABLETS EVERY NIGHT AT BEDTIME   fexofenadine (ALLEGRA) 60 MG tablet Take 60 mg  by mouth daily.   Flaxseed, Linseed, (FLAXSEED OIL PO) Take by mouth.   LORazepam  (ATIVAN ) 1 MG tablet Take 1 tablet (1 mg total) by mouth every 8  (eight) hours as needed.   MAGNESIUM  PO Take by mouth.   Multiple Vitamin (MULTI VITAMIN DAILY PO) Take by mouth.   Multiple Vitamins-Minerals (ZINC PO) Take by mouth.   Omega-3 Fatty Acids (OMEGA-3 FISH OIL PO) Take by mouth.   pantoprazole  (PROTONIX ) 40 MG tablet TAKE 1 TABLET(40 MG) BY MOUTH TWICE DAILY (Patient not taking: Reported on 10/12/2024)   predniSONE  (DELTASONE ) 20 MG tablet Take two tablets by mouth once daily for 5 days. (Patient not taking: Reported on 10/12/2024)   sertraline  (ZOLOFT ) 100 MG tablet Take 2 tablets (200 mg total) by mouth daily.   sildenafil  (VIAGRA ) 100 MG tablet Take 1 tablet (100 mg total) by mouth daily as needed for erectile dysfunction.   VITAMIN E PO Take by mouth.   No facility-administered encounter medications on file as of 10/12/2024.    History: Past Medical History:  Diagnosis Date   Anxiety    Asthma    uses inhaler   Gallstones    Pancreatitis    Past Surgical History:  Procedure Laterality Date   CHOLECYSTECTOMY N/A 12/13/2016   Procedure: LAPAROSCOPIC CHOLECYSTECTOMY WITH INTRAOPERATIVE CHOLANGIOGRAM;  Surgeon: Deward Null III, MD;  Location: [facility];  Service: General;  Laterality: N/A;   GUM SURGERY     IR CATHETER TUBE CHANGE  02/27/2017   IR RADIOLOGIST EVAL & MGMT  02/13/2017   IR RADIOLOGIST EVAL & MGMT  03/12/2017   Family History  Problem Relation Age of Onset   Arthritis Mother    Hyperlipidemia Mother    Dementia Mother    Mental illness Mother    Hyperlipidemia Father    Hearing loss Father    Hyperlipidemia Sister    Mental illness Brother    Intellectual disability Brother    Depression Brother    COPD Brother    Arthritis Brother    Alzheimer's disease Brother    Cancer Brother    Mental illness Brother    Depression Brother    Alcohol abuse Brother    Colon polyps Neg Hx    Colon cancer Neg Hx    Stomach cancer Neg Hx    Esophageal cancer Neg Hx    Social History   Occupational History   Not on file  Tobacco  Use   Smoking status: Never   Smokeless tobacco: Never  Substance and Sexual Activity   Alcohol use: Yes    Alcohol/week: 1.0 standard drink of alcohol    Types: 1 Glasses of wine per week   Drug use: No   Sexual activity: Yes   Tobacco Counseling Counseling given: No  SDOH Screenings   Food Insecurity: No Food Insecurity (10/12/2024)  Housing: Unknown (10/12/2024)  Transportation Needs: No Transportation Needs (10/12/2024)  Utilities: Not At Risk (10/12/2024)  Alcohol Screen: Low Risk (04/22/2023)  Depression (PHQ2-9): Low Risk (10/12/2024)  Financial Resource Strain: Low Risk (04/22/2023)  Physical Activity: Sufficiently Active (10/12/2024)  Social Connections: Socially Integrated (10/12/2024)  Stress: No Stress Concern Present (10/12/2024)  Tobacco Use: Low Risk (10/12/2024)  Health Literacy: Adequate Health Literacy (10/12/2024)   See flowsheets for full screening details  Depression Screen PHQ 2 & 9 Depression Scale- Over the past 2 weeks, how often have you been bothered by any of the following problems? Little interest or pleasure in doing things: 0 Feeling down,  depressed, or hopeless (PHQ Adolescent also includes...irritable): 0 PHQ-2 Total Score: 0     Goals Addressed               This Visit's Progress     Increase physical activity (pt-stated)        Remain active             Objective:    Today's Vitals   10/12/24 0803  BP: 122/64  Pulse: 67  Temp: 97.6 F (36.4 C)  TempSrc: Oral  SpO2: 93%  Weight: 194 lb 8 oz (88.2 kg)  Height: 6' 2 (1.88 m)   Body mass index is 24.97 kg/m.  Hearing/Vision screen Hearing Screening - Comments:: Denies hearing difficulties   Vision Screening - Comments:: Wears rx glasses - up to date with routine eye exams with  [facility] Immunizations and Health Maintenance Health Maintenance  Topic Date Due   COVID-19 Vaccine (1) Never done   Hepatitis C Screening  Never done   DTaP/Tdap/Td (1 - Tdap) Never done    Pneumococcal Vaccine: 50+ Years (1 of 2 - PCV) Never done   Zoster Vaccines- Shingrix (1 of 2) Never done   Influenza Vaccine  Never done   Medicare Annual Wellness (AWV)  10/12/2025   Colonoscopy  06/05/2027   Meningococcal B Vaccine  Aged Out        Assessment/Plan:  This is a routine wellness examination for [name].  Patient Care Team: Micheal Wolm ORN, MD as PCP - General (Family Medicine)  I have personally reviewed and noted the following in the patients chart:   Medical and social history Use of alcohol, tobacco or illicit drugs  Current medications and supplements including opioid prescriptions. Functional ability and status Nutritional status Physical activity Advanced directives List of other physicians Hospitalizations, surgeries, and ER visits in previous 12 months Vitals Screenings to include cognitive, depression, and falls Referrals and appointments  No orders of the defined types were placed in this encounter.  In addition, I have reviewed and discussed with patient certain preventive protocols, quality metrics, and best practice recommendations. A written personalized care plan for preventive services as well as general preventive health recommendations were provided to patient.   [name] ORN Blush, LPN   05/11/7972   Return in 53 weeks (on 10/18/2025).  After Visit Summary: (In Person-Printed) AVS printed and given to the patient  Nurse Notes: HM Addressed: Patient declined vaccines "

## 2024-10-16 ENCOUNTER — Encounter: Payer: Self-pay | Admitting: Family Medicine

## 2024-10-16 ENCOUNTER — Ambulatory Visit: Payer: Self-pay | Admitting: Family Medicine

## 2024-10-16 ENCOUNTER — Ambulatory Visit (INDEPENDENT_AMBULATORY_CARE_PROVIDER_SITE_OTHER): Admitting: Family Medicine

## 2024-10-16 VITALS — BP 112/60 | HR 63 | Temp 97.4°F | Ht 74.0 in | Wt 194.2 lb

## 2024-10-16 DIAGNOSIS — Z125 Encounter for screening for malignant neoplasm of prostate: Secondary | ICD-10-CM

## 2024-10-16 DIAGNOSIS — Z0000 Encounter for general adult medical examination without abnormal findings: Secondary | ICD-10-CM | POA: Diagnosis not present

## 2024-10-16 DIAGNOSIS — J4540 Moderate persistent asthma, uncomplicated: Secondary | ICD-10-CM | POA: Diagnosis not present

## 2024-10-16 LAB — LIPID PANEL
Cholesterol: 176 mg/dL (ref 28–200)
HDL: 45.6 mg/dL
LDL Cholesterol: 101 mg/dL — ABNORMAL HIGH (ref 10–99)
NonHDL: 130.19
Total CHOL/HDL Ratio: 4
Triglycerides: 148 mg/dL (ref 10.0–149.0)
VLDL: 29.6 mg/dL (ref 0.0–40.0)

## 2024-10-16 LAB — CBC WITH DIFFERENTIAL/PLATELET
Basophils Absolute: 0 K/uL (ref 0.0–0.1)
Basophils Relative: 0.7 % (ref 0.0–3.0)
Eosinophils Absolute: 0.2 K/uL (ref 0.0–0.7)
Eosinophils Relative: 4.6 % (ref 0.0–5.0)
HCT: 49 % (ref 39.0–52.0)
Hemoglobin: 16.4 g/dL (ref 13.0–17.0)
Lymphocytes Relative: 20.3 % (ref 12.0–46.0)
Lymphs Abs: 1.1 K/uL (ref 0.7–4.0)
MCHC: 33.6 g/dL (ref 30.0–36.0)
MCV: 97.3 fl (ref 78.0–100.0)
Monocytes Absolute: 0.4 K/uL (ref 0.1–1.0)
Monocytes Relative: 8.5 % (ref 3.0–12.0)
Neutro Abs: 3.5 K/uL (ref 1.4–7.7)
Neutrophils Relative %: 65.9 % (ref 43.0–77.0)
Platelets: 206 K/uL (ref 150.0–400.0)
RBC: 5.03 Mil/uL (ref 4.22–5.81)
RDW: 12.9 % (ref 11.5–15.5)
WBC: 5.3 K/uL (ref 4.0–10.5)

## 2024-10-16 LAB — COMPREHENSIVE METABOLIC PANEL WITH GFR
ALT: 15 U/L (ref 3–53)
AST: 15 U/L (ref 5–37)
Albumin: 4.5 g/dL (ref 3.5–5.2)
Alkaline Phosphatase: 53 U/L (ref 39–117)
BUN: 17 mg/dL (ref 6–23)
CO2: 28 meq/L (ref 19–32)
Calcium: 9.4 mg/dL (ref 8.4–10.5)
Chloride: 106 meq/L (ref 96–112)
Creatinine, Ser: 0.9 mg/dL (ref 0.40–1.50)
GFR: 86.56 mL/min
Glucose, Bld: 91 mg/dL (ref 70–99)
Potassium: 4.5 meq/L (ref 3.5–5.1)
Sodium: 140 meq/L (ref 135–145)
Total Bilirubin: 0.6 mg/dL (ref 0.2–1.2)
Total Protein: 7.1 g/dL (ref 6.0–8.3)

## 2024-10-16 LAB — PSA, MEDICARE: PSA: 2.19 ng/mL (ref 0.10–4.00)

## 2024-10-16 LAB — HEMOGLOBIN A1C: Hgb A1c MFr Bld: 5.7 % (ref 4.6–6.5)

## 2024-10-16 MED ORDER — BUDESONIDE-FORMOTEROL FUMARATE 80-4.5 MCG/ACT IN AERO: 2.0000 | INHALATION_SPRAY | Freq: Two times a day (BID) | RESPIRATORY_TRACT | 3 refills | Status: AC

## 2024-10-16 NOTE — Progress Notes (Signed)
 "  Established Patient Office Visit  Subjective   Patient ID: [name], male    DOB: Jan 29, 1956  Age: 69 y.o. MRN: [id number]  Chief Complaint  Patient presents with   Annual Exam    HPI   [name] is here today for physical exam.  Very health-conscious.  Exercises regularly.  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  He is having some issues with wheezing especially at night.  They have 2 dogs and he thinks this may be a factor.  He wheezes almost nightly and has for quite some time.  Uses albuterol  rescue inhaler frequently.  His past medical history is significant for past history of complicated pancreatitis and generalized anxiety disorder.  Generally feels well.  Appetite and weight stable.  Training and exercising regularly.  No recent chest pains.  Health maintenance reviewed:  Health Maintenance  Topic Date Due   COVID-19 Vaccine (1) Never done   Hepatitis C Screening  Never done   DTaP/Tdap/Td (1 - Tdap) Never done   Influenza Vaccine  01/05/2025 (Originally 05/08/2024)   Zoster Vaccines- Shingrix (1 of 2) 01/14/2025 (Originally 04/30/1973)   Pneumococcal Vaccine: 50+ Years (1 of 2 - PCV) 10/16/2025 (Originally 04/30/1973)   Medicare Annual Wellness (AWV)  10/12/2025   Colonoscopy  06/05/2027   Meningococcal B Vaccine  Aged Out   - Declines pneumonia, influenza, and Shingrix vaccines.  Family history-mother died age 23 possibly of dementia complications.  Father died age 50 of old age .  Brother died in his 25s of oropharyngeal cancer.  He has younger brother with PTSD.  Sister with depression history.  Social history-he is married.  Has a son.  Never smoked.  No alcohol.  Past Medical History:  Diagnosis Date   Anxiety    Asthma    uses inhaler   Gallstones    Pancreatitis    Past Surgical History:  Procedure Laterality Date   CHOLECYSTECTOMY N/A 12/13/2016   Procedure: LAPAROSCOPIC  CHOLECYSTECTOMY WITH INTRAOPERATIVE CHOLANGIOGRAM;  Surgeon: Deward Null III, MD;  Location: [facility];  Service: General;  Laterality: N/A;   GUM SURGERY     IR CATHETER TUBE CHANGE  02/27/2017   IR RADIOLOGIST EVAL & MGMT  02/13/2017   IR RADIOLOGIST EVAL & MGMT  03/12/2017    reports that he has never smoked. He has never used smokeless tobacco. He reports current alcohol use of about 1.0 standard drink of alcohol per week. He reports that he does not use drugs. family history includes Alcohol abuse in his brother; Alzheimer's disease in his brother; Arthritis in his brother and mother; COPD in his brother; Cancer in his brother; Dementia in his mother; Depression in his brother and brother; Hearing loss in his father; Hyperlipidemia in his father, mother, and sister; Intellectual disability in his brother; Mental illness in his brother, brother, and mother. Allergies[1]  Review of Systems  Constitutional:  Negative for chills, fever, malaise/fatigue and weight loss.  HENT:  Negative for hearing loss.   Eyes:  Negative for blurred vision and double vision.  Respiratory:  Positive for wheezing. Negative for cough and shortness of breath.   Cardiovascular:  Negative for chest pain, palpitations and leg swelling.  Gastrointestinal:  Negative for abdominal pain, blood in stool, constipation and diarrhea.  Genitourinary:  Negative for dysuria.  Skin:  Negative for rash.  Neurological:  Negative for dizziness, speech change, seizures, loss of consciousness and headaches.  Psychiatric/Behavioral:  Negative for depression.       Objective:     BP 112/60   Pulse 63   Temp (!) 97.4 F (36.3 C) (Oral)   Ht 6' 2 (1.88 m)   Wt 194 lb 3.2 oz (88.1 kg)   SpO2 97%   BMI 24.93 kg/m  BP Readings from Last 3 Encounters:  10/16/24 112/60  10/12/24 122/64  12/23/23 100/66   Wt Readings from Last 3 Encounters:  10/16/24 194 lb 3.2 oz (88.1 kg)  10/12/24 194 lb 8 oz (88.2 kg)  12/23/23 197 lb (89.4  kg)      Physical Exam Vitals reviewed.  Constitutional:      General: He is not in acute distress.    Appearance: He is well-developed.  HENT:     Head: Normocephalic and atraumatic.     Right Ear: External ear normal.     Left Ear: External ear normal.  Eyes:     Conjunctiva/sclera: Conjunctivae normal.     Pupils: Pupils are equal, round, and reactive to light.  Neck:     Thyroid : No thyromegaly.  Cardiovascular:     Rate and Rhythm: Normal rate and regular rhythm.     Heart sounds: Normal heart sounds. No murmur heard. Pulmonary:     Effort: No respiratory distress.     Breath sounds: No wheezing or rales.  Abdominal:     General: Bowel sounds are normal. There is no distension.     Palpations: Abdomen is soft. There is no mass.     Tenderness: There is no abdominal tenderness. There is no guarding or rebound.  Musculoskeletal:     Cervical back: Normal range of motion and neck supple.     Right lower leg: No edema.     Left lower leg: No edema.  Lymphadenopathy:     Cervical: No cervical adenopathy.  Skin:    Findings: No rash.  Neurological:     Mental Status: He is alert and oriented to person, place, and time.     Cranial Nerves: No cranial nerve deficit.      No results found for any visits on 10/16/24.    The 10-year ASCVD risk score (Arnett DK, et al., 2019) is: 15.2%    Assessment & Plan:   Problem List Items Addressed This Visit   None Visit Diagnoses       Physical exam    -  Primary   Relevant Orders   CBC with Differential/Platelet   Lipid panel   CMP   Hemoglobin A1c     Prostate cancer screening       Relevant Orders   PSA, Medicare     Healthy 69 year old male.  He exercises regularly and daily.  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  He is describing daily wheezing especially at night.  Has been classified as mild intermittent asthma in the past but sounds like more moderate persistent asthma  at this time.  We discussed several items as follows  -Obtain follow-up labs as above - Discussed immunizations including influenza, pneumonia, and Shingrix and he declines each of these - Colonoscopy up-to-date - Patient requesting repeat PSA testing.  He is also requesting A1c though he has no history of diabetes. - Given frequency of wheezing almost daily would recommend starting Symbicort  80 mg 2 puffs twice daily and rinse mouth after use.  Set up 1 month follow-up  Return in about 1 month (around 11/16/2024).  Wolm Scarlet, MD     [1] No Known Allergies  "

## 2024-10-20 ENCOUNTER — Telehealth: Payer: Self-pay | Admitting: *Deleted

## 2024-10-20 NOTE — Addendum Note (Signed)
 Addended by: METTA KRISTEN CROME on: 10/20/2024 08:17 AM   Modules accepted: Orders

## 2024-10-20 NOTE — Telephone Encounter (Signed)
 Copied from CRM [id number]. Topic: Clinical - Lab/Test Results >> Oct 20, 2024 11:48 AM Viola F wrote: Reason for CRM: Patient returned Mykals call - relayed results to patient he had no further questions. He will schedule the 52m lab appointment when he comes in 11/16/24 for follow up.

## 2024-10-20 NOTE — Telephone Encounter (Signed)
 Noted

## 2024-11-16 ENCOUNTER — Ambulatory Visit: Admitting: Family Medicine

## 2025-06-15 ENCOUNTER — Ambulatory Visit: Admitting: Psychiatry

## 2025-10-18 ENCOUNTER — Ambulatory Visit
